# Patient Record
Sex: Female | Born: 1965 | Race: White | Hispanic: No | Marital: Married | Smoking: Never smoker
Health system: Southern US, Community
[De-identification: ages and names within clinical notes are randomized; demographics above are authoritative.]

## PROBLEM LIST (undated history)

## (undated) DIAGNOSIS — E079 Disorder of thyroid, unspecified: Secondary | ICD-10-CM

## (undated) DIAGNOSIS — M1990 Unspecified osteoarthritis, unspecified site: Secondary | ICD-10-CM

## (undated) DIAGNOSIS — J189 Pneumonia, unspecified organism: Secondary | ICD-10-CM

## (undated) DIAGNOSIS — M8580 Other specified disorders of bone density and structure, unspecified site: Secondary | ICD-10-CM

## (undated) DIAGNOSIS — O039 Complete or unspecified spontaneous abortion without complication: Secondary | ICD-10-CM

## (undated) DIAGNOSIS — E042 Nontoxic multinodular goiter: Secondary | ICD-10-CM

## (undated) DIAGNOSIS — K5792 Diverticulitis of intestine, part unspecified, without perforation or abscess without bleeding: Secondary | ICD-10-CM

## (undated) DIAGNOSIS — K56609 Unspecified intestinal obstruction, unspecified as to partial versus complete obstruction: Secondary | ICD-10-CM

## (undated) DIAGNOSIS — T7840XA Allergy, unspecified, initial encounter: Secondary | ICD-10-CM

## (undated) DIAGNOSIS — D497 Neoplasm of unspecified behavior of endocrine glands and other parts of nervous system: Secondary | ICD-10-CM

## (undated) DIAGNOSIS — J45909 Unspecified asthma, uncomplicated: Secondary | ICD-10-CM

## (undated) DIAGNOSIS — F419 Anxiety disorder, unspecified: Secondary | ICD-10-CM

## (undated) HISTORY — PX: KNEE ARTHROSCOPY: SUR90

## (undated) HISTORY — PX: ABDOMINAL HYSTERECTOMY: SHX81

## (undated) HISTORY — DX: Pneumonia, unspecified organism: J18.9

## (undated) HISTORY — DX: Neoplasm of unspecified behavior of endocrine glands and other parts of nervous system: D49.7

## (undated) HISTORY — DX: Disorder of thyroid, unspecified: E07.9

## (undated) HISTORY — DX: Unspecified intestinal obstruction, unspecified as to partial versus complete obstruction: K56.609

## (undated) HISTORY — DX: Complete or unspecified spontaneous abortion without complication: O03.9

## (undated) HISTORY — DX: Other specified disorders of bone density and structure, unspecified site: M85.80

## (undated) HISTORY — PX: FINE NEEDLE ASPIRATION: SHX406

## (undated) HISTORY — DX: Unspecified osteoarthritis, unspecified site: M19.90

## (undated) HISTORY — PX: TONSILLECTOMY: SUR1361

## (undated) HISTORY — DX: Unspecified asthma, uncomplicated: J45.909

## (undated) HISTORY — DX: Nontoxic multinodular goiter: E04.2

## (undated) HISTORY — DX: Anxiety disorder, unspecified: F41.9

## (undated) HISTORY — DX: Diverticulitis of intestine, part unspecified, without perforation or abscess without bleeding: K57.92

## (undated) HISTORY — DX: Allergy, unspecified, initial encounter: T78.40XA

## (undated) NOTE — Anesthesia Preprocedure Evaluation (Signed)
 Formatting of this note might be different from the original. Anesthesia ROS/Med History   + History of anesthetic complications  + PONV  Neuro/Psych  + headaches (Remote hx.)  type: migraine headaches + psychiatric history, type: anxiety  Pulmonary  + asthma (inhaler prn.) Negative for tobacco use  Cardiovascular   09/2014 EKG: SR. :   GI/Hepatic/Renal  diverticulitis:  Endo/Cancer/Other  Thyroid  nodules followed by Dr. Hortensia.  Parathyroid adenoma:   Anesthesia Physical Exam Airway  Mallampati: I Pulmonary  Normal systems: pulmonary exam normal breath sounds clear to auscultation Cardiovascular  Normal systems: cardiovascular exam normal  rhythm: regular  rate: normal   Anesthesia Plan ASA Score: 3   Anesthesia Plan of Care:  GETA Plan Monitor  Monitor: routine Postoperative Plan  Post Operative Plan: routine  Informed consent  Anesthetic Plan and risks discussed with the patient.  Discussed the anesthetic plan with the CRNA and Anesthesiologist.  Information  H & P update: patient reports no changes   Electronically signed by Roselind JINNY Schlatter, CRNA at 12/09/2014  9:23 AM EDT

## (undated) NOTE — Anesthesia Postprocedure Evaluation (Signed)
 Formatting of this note is different from the original.  Anesthesia Post Note  Patient: Makayla Jackson  Procedure(s) Performed: Procedure(s): LEFT MINIMALLY INVASIVE PARATHYROIDECTOMY W/ INTRAOPERATIVE ASSAY   Mental Status: awake and alert   Cardiopulmonary Status: stable and airway patent  Hydration Status: appropriate hydration  PostOp Pain: No pain  Anesthetic Complications: none  Nausea and Vomiting: None  Nausea Protocol: Followed  Per GI Nursing Notes:    Last Vitals:  Filed Vitals:   12/09/14 1255 12/09/14 1300  BP: 122/77 124/82  Pulse: 71 67  Temp:    Resp: 16 20  SpO2: 99% 98%  PainSc: 3     Comments:   Electronically signed by Greig GAILS. Rena, MD at 12/09/2014  1:07 PM EDT

## (undated) NOTE — Unmapped External Note (Signed)
 Formatting of this note might be different from the original. Anesthesia Transfer of Care Note  Patient: Makayla Jackson Procedure(s) performed: Procedure(s): LEFT MINIMALLY INVASIVE PARATHYROIDECTOMY W/ INTRAOPERATIVE ASSAY  Patient location: PACU Patient transported: on O2 Pain score: No pain Post assessment: no apparent anesthetic complications Post vital signs: stable Patient temperature: Patient's temperature is 98.2 Temperature management: Temperature greater than or equal to 36 degrees Celsius (96.83F) Mental status: awake and alert   Comments:  Electronically signed by Roselind JINNY Schlatter, CRNA at 12/09/2014 12:09 PM EDT

## (undated) NOTE — Anesthesia Preprocedure Evaluation (Signed)
 Formatting of this note might be different from the original. Anesthesia ROS/Med History   + History of anesthetic complications  + PONV  Neuro/Psych  + headaches (Remote hx.)  type: migraine headaches + psychiatric history, type: anxiety  Pulmonary  + asthma (inhaler prn.) Negative for tobacco use  Cardiovascular  Patient has a negative cardiovascular ROS   GI/Hepatic/Renal  diverticulitis:  Hematology  Patient has a negative hematology ROS  Endo/Cancer/Other  Patient has a neg endo/other ROS  Thyroid  nodules followed by Dr. Hortensia.:  Dental  Normal systems: no notable dental hx    Anesthesia Physical Exam Airway  Mallampati: II Pulmonary  Normal systems: pulmonary exam normal breath sounds clear to auscultation Cardiovascular  Normal systems: cardiovascular exam normal  rhythm: regular  rate: normal   Anesthesia Plan ASA Score: 2   Anesthesia Plan of Care:  GETA Plan Monitor  Monitor: routine Postoperative Plan  Post Operative Plan: routine  Informed consent  Anesthetic Plan and risks discussed with the patient.  The use of blood products was discussed with and consented by the patient.  Consent given Discussed the anesthetic plan with the CRNA and Anesthesiologist.    Electronically signed by Wolm JAYSON Braun, CRNA at 06/01/2013  6:55 AM EDT

## (undated) NOTE — Anesthesia Postprocedure Evaluation (Signed)
 Formatting of this note is different from the original. Anesthesia Post Note  Patient: Makayla Jackson  Procedure(s) Performed: Procedure(s): LAPAROSCOPIC LEFT COLECTOMY   Mental Status: awake and alert   Cardiopulmonary Status: stable  Hydration Status: appropriate hydration  PostOp Pain: 3/10  Anesthetic Complications: none  Last Vitals:  Filed Vitals:   06/01/13 1055  BP:   Pulse:   Temp:   Resp:   SpO2:   PainSc: 5    Comments: Pain meds administered after VAS 5  Electronically signed by Diamond Andra Mickey LULLA, MD PhD at 06/01/2013 10:59 AM EDT

## (undated) NOTE — Unmapped External Note (Signed)
 Formatting of this note is different from the original. Anesthesia Transfer of Care Note  Patient: Makayla Jackson Procedure(s) performed: Procedure(s): LAPAROSCOPIC LEFT COLECTOMY  Patient location: PACU Patient transported: on O2  Pain score: No pain Post assessment: no apparent anesthetic complications Last vitals:  Filed Vitals:   06/01/13 0951  BP: 138/86  Pulse: 77  Temp: 36.5 C  Resp: 21  SpO2: 100%  PainSc:    Post vital signs: stable Mental status: awake and alert  Complications: none  Comments:  Electronically signed by Wolm JAYSON Braun, CRNA at 06/01/2013  9:53 AM EDT

## (undated) NOTE — Addendum Note (Signed)
 Formatting of this note is different from the original. Addendum created 06/01/13 1258 by Wolm JAYSON Braun, CRNA   Modules edited: Anesthesia Medication Administration   Electronically signed by Wolm JAYSON Braun, CRNA at 06/01/2013 12:58 PM EDT

---

## 1996-08-06 HISTORY — PX: NASAL SINUS SURGERY: SHX719

## 1998-11-11 ENCOUNTER — Other Ambulatory Visit: Admission: RE | Admit: 1998-11-11 | Discharge: 1998-11-11 | Payer: Self-pay | Admitting: *Deleted

## 1999-10-18 ENCOUNTER — Other Ambulatory Visit: Admission: RE | Admit: 1999-10-18 | Discharge: 1999-10-18 | Payer: Self-pay | Admitting: Family Medicine

## 1999-11-02 ENCOUNTER — Encounter: Payer: Self-pay | Admitting: Family Medicine

## 1999-11-02 ENCOUNTER — Encounter: Admission: RE | Admit: 1999-11-02 | Discharge: 1999-11-02 | Payer: Self-pay | Admitting: Family Medicine

## 2000-05-21 ENCOUNTER — Encounter: Payer: Self-pay | Admitting: Family Medicine

## 2000-05-21 ENCOUNTER — Encounter: Admission: RE | Admit: 2000-05-21 | Discharge: 2000-05-21 | Payer: Self-pay | Admitting: Family Medicine

## 2001-11-10 ENCOUNTER — Other Ambulatory Visit: Admission: RE | Admit: 2001-11-10 | Discharge: 2001-11-10 | Payer: Self-pay | Admitting: Family Medicine

## 2002-07-22 ENCOUNTER — Ambulatory Visit (HOSPITAL_COMMUNITY): Admission: RE | Admit: 2002-07-22 | Discharge: 2002-07-22 | Payer: Self-pay | Admitting: Family Medicine

## 2002-07-22 ENCOUNTER — Encounter: Payer: Self-pay | Admitting: Family Medicine

## 2004-09-08 ENCOUNTER — Other Ambulatory Visit: Admission: RE | Admit: 2004-09-08 | Discharge: 2004-09-08 | Payer: Self-pay | Admitting: Family Medicine

## 2005-03-14 ENCOUNTER — Encounter: Admission: RE | Admit: 2005-03-14 | Discharge: 2005-03-14 | Payer: Self-pay | Admitting: Family Medicine

## 2006-01-28 ENCOUNTER — Encounter: Admission: RE | Admit: 2006-01-28 | Discharge: 2006-01-28 | Payer: Self-pay | Admitting: Orthopedic Surgery

## 2006-02-04 ENCOUNTER — Encounter: Admission: RE | Admit: 2006-02-04 | Discharge: 2006-03-21 | Payer: Self-pay | Admitting: Orthopedic Surgery

## 2006-10-08 ENCOUNTER — Ambulatory Visit: Payer: Self-pay | Admitting: Internal Medicine

## 2006-10-08 LAB — CONVERTED CEMR LAB
ALT: 17 units/L (ref 0–40)
AST: 26 units/L (ref 0–37)
Amylase: 95 units/L (ref 27–131)
BUN: 9 mg/dL (ref 6–23)
Basophils Absolute: 0 10*3/uL (ref 0.0–0.1)
Basophils Relative: 0.6 % (ref 0.0–1.0)
Chloride: 105 meq/L (ref 96–112)
Eosinophils Absolute: 0.2 10*3/uL (ref 0.0–0.6)
Eosinophils Relative: 2.4 % (ref 0.0–5.0)
Glucose, Bld: 90 mg/dL (ref 70–99)
Lipase: 35 units/L (ref 11.0–59.0)
Lymphocytes Relative: 24.7 % (ref 12.0–46.0)
MCHC: 34.8 g/dL (ref 30.0–36.0)
Monocytes Absolute: 0.7 10*3/uL (ref 0.2–0.7)
Monocytes Relative: 10.7 % (ref 3.0–11.0)
Neutro Abs: 4 10*3/uL (ref 1.4–7.7)
Platelets: 264 10*3/uL (ref 150–400)
Potassium: 4.1 meq/L (ref 3.5–5.1)
RBC: 4.28 M/uL (ref 3.87–5.11)
RDW: 12.1 % (ref 11.5–14.6)
Sed Rate: 9 mm/hr (ref 0–25)
Total Bilirubin: 0.6 mg/dL (ref 0.3–1.2)
WBC: 6.5 10*3/uL (ref 4.5–10.5)

## 2008-02-10 ENCOUNTER — Ambulatory Visit (HOSPITAL_COMMUNITY): Admission: RE | Admit: 2008-02-10 | Discharge: 2008-02-11 | Payer: Self-pay | Admitting: Obstetrics and Gynecology

## 2008-02-10 ENCOUNTER — Encounter (INDEPENDENT_AMBULATORY_CARE_PROVIDER_SITE_OTHER): Payer: Self-pay | Admitting: Obstetrics and Gynecology

## 2008-02-20 ENCOUNTER — Ambulatory Visit (HOSPITAL_COMMUNITY): Admission: RE | Admit: 2008-02-20 | Discharge: 2008-02-20 | Payer: Self-pay | Admitting: Obstetrics and Gynecology

## 2008-08-06 HISTORY — PX: PARTIAL HYSTERECTOMY: SHX80

## 2010-08-27 ENCOUNTER — Encounter: Payer: Self-pay | Admitting: Family Medicine

## 2010-12-19 NOTE — Discharge Summary (Signed)
NAMEPARISSA, Makayla Jackson                 ACCOUNT NO.:  [id number]   MEDICAL RECORD NO.:  [id number]          PATIENT TYPE:  OIB   LOCATION:  9307                          [facility]ILITY:  [facility]   PHYSICIAN:  Guy Sandifer. Henderson Cloud, M.D. DATE OF BIRTH:  18-Oct-1965   DATE OF ADMISSION:  02/10/2008  DATE OF DISCHARGE:  02/11/2008                               DISCHARGE SUMMARY   ADMITTING DIAGNOSIS:  Menometrorrhagia and dysmenorrhea.   DISCHARGE DIAGNOSIS:  Menometrorrhagia and dysmenorrhea.   PROCEDURE:  On February 10, 2008, laparoscopically-assisted vaginal  hysterectomy with ablation of endometriosis.   REASON FOR ADMISSION:  This patient is a 45 year old married white  female G2, P1, husband status post vasectomy with heavy painful menses.  Details are dictated in history and physical.   HOSPITAL COURSE:  The patient was admitted to the hospital, undergoes  the above procedure.  Estimated blood loss is 150 mL.  On the evening of  surgery, she has good pain relief.  She is tolerating regular diet,  stable vital signs, and was afebrile with dilute urine output.  On the  day of discharge, she is tolerating regular diet, passing flatus,  ambulating, and voiding.  Throughout the evening and today this morning,  she has had some bothersome right upper quadrant pain.  She states it is  not severe.  There is no nausea or vomiting and no food intolerance.  She reports apparently normal HIDA scan about 1 year ago.  On exam, she  has mild tenderness at the right costal margin, but no rebound or  masses.  Abdomen is otherwise soft with good bowel sounds.  On the first  postoperative day, hemoglobin is 11.5, white count is 9.4, and she is  afebrile.  We will obtain a complete metabolic panel today.  If this  appears normal, we will discharge home.  She has already had breakfast  this morning.  She lives about 30 miles away.  We will schedule a  fasting right upper quadrant ultrasound early next week.   CONDITION  ON DISCHARGE:  Good.   DIET:  Low-fat diet.   MEDICATIONS:  1. Percocet 5/325 mg #30, 1-2 p.o. q.6 h. p.r.n.  2. Ibuprofen 600 mg q.6 h. p.r.n.  3. Multivitamin daily.   She will call for problems including but not limited to increasing pain,  persistent nausea, vomiting, heavy bleeding, or temperature 101 degrees.  Followup fasting right upper quadrant ultrasound early next week and  followup in the office in 2 weeks.     Guy Sandifer Henderson Cloud, M.D.  Electronically Signed    JET/MEDQ  D:  02/11/2008  T:  02/11/2008  Job:  [id number]

## 2010-12-19 NOTE — Op Note (Signed)
Makayla Jackson, Makayla Jackson                 ACCOUNT NO.:  [id number]   MEDICAL RECORD NO.:  [id number]          PATIENT TYPE:  OIB   LOCATION:  9307                          [facility]ILITY:  [facility]   PHYSICIAN:  Guy Sandifer. Henderson Cloud, M.D. DATE OF BIRTH:  [birth year]   DATE OF PROCEDURE:  02/10/2008  DATE OF DISCHARGE:                               OPERATIVE REPORT   PREOPERATIVE DIAGNOSES:  Menometrorrhagia and dysmenorrhea.   POSTOPERATIVE DIAGNOSES:  Menometrorrhagia and dysmenorrhea.   PROCEDURE:  Laparoscopically-assisted vaginal hysterectomy with ablation  of endometriosis.   SURGEON:  Khya Halls. Henderson Cloud, MD   ASSISTANT:  Juluis Mire, MD   ANESTHESIA:  General with endotracheal intubation.   SPECIMENS:  Uterus to pathology.   ESTIMATED BLOOD LOSS:  150 mL.   INDICATIONS AND CONSENTS:  This patient is a 45 year old married white  female G2, P1, husband status post vasectomy with progressively heavier  more painful menses.  Details are dictated in history and physical exam.  Laparoscopically-assisted vaginal hysterectomy and possible right  salpingo-oophorectomy is discussed with the patient preoperatively.  Potential risks and complications are discussed preoperatively including  but not limited to infection, organ damage, bleeding requiring  transfusion of blood products with possible HIV and hepatitis  acquisition, DVT, PE, pneumonia, pelvic pain, dyspareunia, laparotomy,  and fistula formation.  All questions have been answered and consent is  signed on the chart.   FINDINGS:  Upper abdomen is grossly normal.  Appendix is normal.  There  are three implants of endometriosis along the vesicouterine fold.  These  are arranged on both sides and in the middle.  There is also an implant  of endometriosis on the right uterosacral ligament.  Tubes and ovaries  are normal bilaterally.   PROCEDURE:  The patient is taken to the operating room where she is  identified, placed in dorsal supine  position and general anesthesia is  induced via endotracheal intubation.  She is then placed in dorsal  lithotomy position where she is prepped abdominally and vaginally.  Bladder straight catheterized.  Hulka tenaculum is placed in the uterus  as a manipulator and she is draped in a sterile fashion.  The  infraumbilical and suprapubic areas are injected in the midline with  0.50% plain Marcaine.  A small infraumbilical incision is made and a  disposable Veress needle is placed on the first attempt without  difficulty.  Normal syringe and drop test are noted.  Two liters of gas  are then insufflated under low pressure with good tympany in the right  upper quadrant.  Veress needle is removed and a 10/11 XL bladeless  disposable trocar sleeve is placed using direct visualization with the  diagnostic laparoscopic.  After placement, the operative laparoscope is  used.  A small suprapubic incision is made in the midline.  The 5-mm XL  bladeless disposable trocar sleeve is placed under direct visualization  without difficulty.  The above findings are noted.  Then using the  EnSeal device with bipolar cautery, the implants of endometriosis are  ablated.  The ureters are seemed to be well cleared  areas of surgery.  Proximal ligaments are then taken down bilaterally on the uterus down to  the level of the vesicouterine peritoneum.  Vesicouterine peritoneum is  then taken down cephalad laterally with scissors.  Good hemostasis is  maintained.  The suprapubic trocar sleeve is removed.  Instruments are  removed and attention is turned to the vagina.  Posterior cul-de-sac is  entered sharply.  Cervix is circumscribed with unipolar cautery, and the  mucosa is advanced sharply and bluntly.  Then using the Gyrus bipolar  cautery instrument, the uterosacral ligaments are taken down followed by  the cardinal ligaments and the uterine vessels bilaterally.  The fundus  is delivered posteriorly.  Remaining  pedicles are taken down and the  specimen is delivered out.  Uterosacral ligaments are then plicated to  the vaginal cuff bilaterally using separate sutures of 0 Monocryl.  All  suture will be 0 Monocryl unless otherwise designated.  Uterosacral  ligaments are then plicated in the midline with a third suture.  Cuff is  closed with figure-of-eights.  Foley catheter is placed in the bladder  and clear urine is noted.  Attention is returned to the abdomen.  Pneumoperitoneum is reintroduced.  Suprapubic trocar sleeve is  reintroduced under direct visualization.  Copious irrigation is carried  out.  Minor bipolar cautery is used to obtain complete hemostasis on  peritoneal edges.  Excess fluid is removed.  Instruments are removed.  Pneumoperitoneum is reduced and the umbilical trocar sleeve is removed  as well.  Skin is closed with 2-0 Vicryl sutures on both incisions.  Dermabond is placed on both incisions.  All counts are correct.  The  patient is awakened, taken to recovery room in stable condition.      Guy Sandifer Henderson Cloud, M.D.  Electronically Signed     JET/MEDQ  D:  02/10/2008  T:  02/10/2008  Job:  [id number]

## 2010-12-19 NOTE — H&P (Signed)
Makayla Jackson, Makayla Jackson                 ACCOUNT NO.:  [id number]   MEDICAL RECORD NO.:  [id number]          PATIENT TYPE:  AMB   LOCATION:                                [facility]   PHYSICIAN:  Lorrayne Ismael. Henderson Cloud, M.D. DATE OF BIRTH:  02/17/1966   DATE OF ADMISSION:  02/10/2008  DATE OF DISCHARGE:                              HISTORY & PHYSICAL   CHIEF COMPLAINT:  Heavy painful menses.   HISTORY OF PRESENT ILLNESS:  This patient is a 45 year old married white  female G2, P1, husband status post vasectomy who is having progressively  heavier menses.  They are occurring about every 3 weeks.  She will have  at least 3 days of heavy flow each month changing a tampon at least  every 2 hours.  This also is accompanied by back cramping starting  before and with the menses.  The pain is greater on the right than on  the left.  Ultrasound on my office reveals a uterus measuring 9.1 x 5.1  x 6.6 cm.  Sonohysterogram was negative for intracavitary masses and the  ovaries appeared normal.  After discussion of options, she is being  admitted for laparoscopically-assisted vaginal hysterectomy.  The right  tube and ovary may be removed if distinctly abnormal.  Potential risks  and complications have been reviewed preoperatively.   PAST MEDICAL HISTORY:  1. Asthma.  2. History of urinary tract infections.   PAST SURGICAL HISTORY:  1. Tonsillectomy.  2. Sinus surgery.  3. Knee surgery.   OBSTETRICAL HISTORY:  Vaginal delivery x1.   MEDICATIONS:  None.   ALLERGIES:  PENICILLIN leading to rash and SULFA leading to rash.  The  patient states that she has taken Keflex without problems.   SOCIAL HISTORY:  Denies tobacco, alcohol, or drug abuse.   FAMILY HISTORY:  Positive for heart disease, respiratory illness,  osteoporosis, and arthritis.   REVIEW OF SYSTEMS:  NEURO:  Denies headache.  CARDIAC:  Denies chest  pain.  PULMONARY:  Denies shortness of breath.  GI: Denies recent  changes in  bowel habits.   PHYSICAL EXAM:  VITAL SIGNS: Height 5 feet 5 inches, weight 129.4  pounds, and blood pressure 102/60.  LUNGS: Clear to auscultation.  HEART: Regular rate and rhythm.  ABDOMEN: Soft and nontender without masses.  PELVIC EXAM: Vulva, vaginal, and cervix without lesion.  Uterus is  anteverted, 6 weeks in size, mobile, and mildly tender.  Right adnexa  mildly tender without palpable masses.  Left adnexa nontender without  masses.  EXTREMITIES: Grossly within normal limits.  NEUROLOGICAL  EXAM: Grossly within normal limits.   ASSESSMENT:  Menometrorrhagia and dysmenorrhea.   PLAN:  Laparoscopically-assisted vaginal hysterectomy and possible right  salpingo-oophorectomy.      Guy Sandifer Henderson Cloud, M.D.  Electronically Signed     JET/MEDQ  D:  02/05/2008  T:  02/06/2008  Job:  [id number]

## 2010-12-22 NOTE — Assessment & Plan Note (Signed)
[facility]                         GASTROENTEROLOGY OFFICE NOTE   Makayla Jackson, Makayla Jackson                        MRN:          [id number]  DATE:10/08/2006                            DOB:          [birth year]    REFERRING PHYSICIAN:  Ysidro Evert., [facility]  Family Medicine.   REASON FOR CONSULTATION:  Gas, bloating, abdominal pain.   ASSESSMENT:  A 45 year old white woman with problems of bloating, some  intermittent diarrhea and epigastric pain that has become progressively  worse after she had knee surgery in the summer.  Some of her symptoms of  diarrhea and nausea may be coming from a recently completed course of  antibiotics for positive H. pylori blood test.   It sounds most like irritable bowel phenomenon to me.  Her hemoglobin is  normal.  There has been no evidence of bleeding.  She has been under  some situational stressors that could be playing a roll.  There are  really no worrisome features to this, i.e., weight loss or bleeding at  this point.   PLAN:  1. Observe things for the time being and let the possible side effects      of her antibiotics clear.  2. Levbid is to be tried one pill twice a day.  3. Align probiotic is to be tried one a day.  4. Laboratory work-up with CBC, differential, CMET, amylase and lipase      and sed rate.  5. Check home Hemoccults.  6. When she is 41 or in her early 88s it would not be unreasonable to      perform a screening colonoscopy because her mother had polyps      before the age of 41 and her father had had colon polyps as well.      We may need to do this for diagnostic purposes but have decided to      hold off on that and upper endoscopy for the time being and follow      up in a month to regroup.  Note:  She has already had an ultrasound      of the abdomen which was unrevealing.   HISTORY:  This is a 44 year old white woman with the problems as  described above.  She  had ligament damage and had right knee surgery in  the summer and since that time, she developed postprandial epigastric  discomfort and some intermittent urgent defecation.  She had not really  had those problems before.  It is not associated with bleeding, weight  loss, fever or chills.  It sort of waxed and waned and then has become  worse.  Her daughter got married in the past year and the patient thinks  that her daughter was too young to do that.  Her husband is about to  change jobs and become Advertising copywriter in [place], [place],  and there is some stress associated with that.  She took H. pylori  therapy with metronidazole, Biaxin and Prevacid and was quite nauseated  and has had some increase in defecation with  loose stools and, in fact,  had eight stools yesterday.  She just finished that.  She has not moved  her bowels today, however.  There has been no bleeding. Gallbladder  ultrasound was unrevealing.  Hemoglobin as described above.   CURRENT MEDICATIONS:  Really none except for Claritin as needed.   ALLERGIES:  PENICILLIN and SULFA.   PAST MEDICAL HISTORY:  1. As described above.  2. Allergies.  3. Right knee surgery.  4. Tonsillectomy age 62.  5. Sinus surgery age 59.   FAMILY HISTORY:  Grandmother with diabetes.  Parents have heart disease.  A cousin had ovarian cancer.  Review otherwise negative except as stated  above.   REVIEW OF SYSTEMS:  See my medical history form for full details.   SOCIAL HISTORY:  She is married.  She is an Tourist information centre manager.  One son, two daughters.  Very sparing alcohol.  No tobacco or drug use.   PHYSICAL EXAMINATION:  GENERAL APPEARANCE:  A pleasant well-developed,  well-nourished white woman.  VITAL SIGNS:  Height 5 feet 4-1/2 inches, weight 136 pounds, blood  pressure 110/72, pulse 84 regular.  HEENT:  Eyes anicteric.  EENT normal mouth, nose and pharynx.  NECK:  Supple, no thyromegaly.  CHEST:  Clear.   CARDIOVASCULAR:  S1 and S2, no murmurs or gallops.  ABDOMEN:  Mildly tender in a diffuse fashion without organomegaly or  mass.  LYMPHATIC:  No neck or supraclavicular nodes.  EXTREMITIES:  No edema.  SKIN:  She has a couple of hives on her neck today, otherwise free of  lesions.  NEUROLOGIC/PSYCHIATRIC:  She is alert and oriented x3.   I appreciate the opportunity to care for this patient.  I have reviewed  the office notes and laboratory testing sent by [facility].     Iva Boop, MD,FACG  Electronically Signed    CEG/MedQ  DD: 10/08/2006  DT: 10/09/2006  Job #: [id number]   cc:   [facility], [place].C.

## 2011-05-03 LAB — COMPREHENSIVE METABOLIC PANEL
Albumin: 2.6 — ABNORMAL LOW
BUN: 3 — ABNORMAL LOW
CO2: 28
Calcium: 9.1
Potassium: 3.4 — ABNORMAL LOW
Sodium: 139

## 2011-05-03 LAB — CBC
Hemoglobin: 14.5
MCHC: 33.9
MCV: 90.6
RBC: 4.75
WBC: 5.8

## 2012-05-25 DIAGNOSIS — E042 Nontoxic multinodular goiter: Secondary | ICD-10-CM | POA: Insufficient documentation

## 2012-08-06 HISTORY — PX: COLON SURGERY: SHX602

## 2012-08-20 DIAGNOSIS — N83209 Unspecified ovarian cyst, unspecified side: Secondary | ICD-10-CM | POA: Insufficient documentation

## 2012-08-20 DIAGNOSIS — K5900 Constipation, unspecified: Secondary | ICD-10-CM | POA: Insufficient documentation

## 2013-12-21 DIAGNOSIS — G43109 Migraine with aura, not intractable, without status migrainosus: Secondary | ICD-10-CM | POA: Insufficient documentation

## 2014-01-12 ENCOUNTER — Encounter: Payer: Self-pay | Admitting: Pediatrics

## 2014-12-05 HISTORY — PX: PARATHYROIDECTOMY: SHX19

## 2015-02-08 DIAGNOSIS — R238 Other skin changes: Secondary | ICD-10-CM | POA: Insufficient documentation

## 2015-07-22 DIAGNOSIS — F419 Anxiety disorder, unspecified: Secondary | ICD-10-CM | POA: Insufficient documentation

## 2016-08-06 DIAGNOSIS — K56609 Unspecified intestinal obstruction, unspecified as to partial versus complete obstruction: Secondary | ICD-10-CM

## 2016-08-06 HISTORY — DX: Unspecified intestinal obstruction, unspecified as to partial versus complete obstruction: K56.609

## 2016-08-07 DIAGNOSIS — K56609 Unspecified intestinal obstruction, unspecified as to partial versus complete obstruction: Secondary | ICD-10-CM | POA: Insufficient documentation

## 2016-08-17 DIAGNOSIS — M79674 Pain in right toe(s): Secondary | ICD-10-CM | POA: Insufficient documentation

## 2016-10-15 ENCOUNTER — Encounter: Payer: Self-pay | Admitting: Pediatrics

## 2016-10-18 DIAGNOSIS — Z8719 Personal history of other diseases of the digestive system: Secondary | ICD-10-CM | POA: Insufficient documentation

## 2017-03-21 ENCOUNTER — Encounter: Payer: Self-pay | Admitting: Pediatrics

## 2017-03-21 ENCOUNTER — Ambulatory Visit (INDEPENDENT_AMBULATORY_CARE_PROVIDER_SITE_OTHER): Payer: BC Managed Care – PPO | Admitting: Pediatrics

## 2017-03-21 VITALS — BP 130/79 | HR 61 | Temp 98.4°F | Ht 64.0 in | Wt 147.8 lb

## 2017-03-21 DIAGNOSIS — E041 Nontoxic single thyroid nodule: Secondary | ICD-10-CM

## 2017-03-21 DIAGNOSIS — E042 Nontoxic multinodular goiter: Secondary | ICD-10-CM | POA: Diagnosis not present

## 2017-03-21 DIAGNOSIS — J4520 Mild intermittent asthma, uncomplicated: Secondary | ICD-10-CM

## 2017-03-21 DIAGNOSIS — E069 Thyroiditis, unspecified: Secondary | ICD-10-CM | POA: Diagnosis not present

## 2017-03-21 DIAGNOSIS — F439 Reaction to severe stress, unspecified: Secondary | ICD-10-CM | POA: Diagnosis not present

## 2017-03-21 DIAGNOSIS — M8580 Other specified disorders of bone density and structure, unspecified site: Secondary | ICD-10-CM

## 2017-03-21 MED ORDER — FLUTICASONE PROPIONATE HFA 110 MCG/ACT IN AERO: INHALATION_SPRAY | RESPIRATORY_TRACT | 3 refills | Status: DC

## 2017-03-21 NOTE — Progress Notes (Signed)
Subjective:   Patient ID: Makayla Jackson, female    DOB: 1965/11/29, 51 y.o.   MRN: [id number] CC: New Patient (Initial Visit) follow up med problems HPI: Makayla Jackson is a 51 y.o. 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 Asthma   . Bowel obstruction ([place]) 08/2016  . Diverticulitis   . Miscarriage   . Multiple thyroid nodules   . Osteopenia   . Parathyroid tumor   . Thyroid disease    Family History  Problem Relation Age of Onset  . Arthritis Mother   . Arthritis/Rheumatoid Mother   . Asthma Mother   . Heart disease Mother   . Hypertension Mother   . Stroke Mother   . Vision loss Mother   . Cancer Mother   . Arthritis Father   . Heart disease Father   . Hypertension Father   . Arthritis/Rheumatoid Maternal Aunt   . Arthritis/Rheumatoid Maternal Uncle   . Diabetes Maternal Grandmother   . Cancer Maternal Grandfather   . Cancer Paternal Grandfather   . Cancer Cousin    Social History   Social History  . Marital status: Married    Spouse name: N/A  . Number of children: N/A  . Years of education: N/A   Social History Main Topics  . Smoking status: Never Smoker  . Smokeless tobacco: Never Used  . Alcohol use 0.6 oz/week    1 Glasses of wine per week     Comment: Occasional  . Drug use: No  . Sexual activity: No   Other  Topics Concern  . None   Social History Narrative  . None   ROS: All systems negative other than what is in HPI  Objective:    BP 130/79   Pulse 61   Temp 98.4 F (36.9 C) (Oral)   Ht 5\' 4"  (1.626 m)   Wt 147 lb 12.8 oz (67 kg)   BMI 25.37 kg/m   Wt Readings from Last 3 Encounters:  03/21/17 147 lb 12.8 oz (67 kg)    Gen: NAD, alert, cooperative with exam, NCAT EYES: EOMI, no conjunctival injection, or no icterus ENT:  TMs pearly gray b/l, OP without erythema LYMPH: no cervical LAD NECK: nl thyroid to palpation CV: NRRR, normal S1/S2, no murmur, distal pulses 2+ b/l Resp: CTABL, no wheezes, normal WOB Abd: +BS, soft, NTND. no guarding or organomegaly Ext: No edema, warm Neuro: Alert and oriented, strength equal b/l UE and LE, coordination grossly normal MSK: normal muscle bulk  Assessment & Plan:  Makayla Jackson was seen today for new patient (initial visit).  Diagnoses and all orders for this visit:  Thyroiditis Recheck labs, previously records available in [place] Not on medication at this time Ref to endocrine -     TSH -     T4, Free -     T3,  Free  Multiple thyroid nodules Followed regularly with ultrasounds per pt -     Ambulatory referral to Endocrinology  Mild intermittent reactive airway disease without complication Stable, cont below Albuterol prn, let me know if using regularly -     fluticasone (FLOVENT HFA) 110 MCG/ACT inhaler; Inhale 2 puffs into the lungs 2 (two) times daily.  Stress Managing well  Osteopenia, unspecified location Cont Ca, Vit D  Follow up plan: Return in about 6 months (around 09/21/2017). Assunta Found, MD [place]

## 2017-03-22 LAB — T3, FREE: T3 FREE: 3.4 pg/mL (ref 2.0–4.4)

## 2017-03-22 LAB — T4, FREE: Free T4: 1.06 ng/dL (ref 0.82–1.77)

## 2017-03-22 LAB — TSH: TSH: 0.285 u[IU]/mL — AB (ref 0.450–4.500)

## 2017-06-06 ENCOUNTER — Encounter: Payer: Self-pay | Admitting: Internal Medicine

## 2017-06-11 ENCOUNTER — Ambulatory Visit (INDEPENDENT_AMBULATORY_CARE_PROVIDER_SITE_OTHER): Payer: BC Managed Care – PPO | Admitting: Family Medicine

## 2017-06-11 VITALS — BP 129/78 | HR 83 | Temp 99.0°F | Ht 64.0 in | Wt 145.2 lb

## 2017-06-11 DIAGNOSIS — N951 Menopausal and female climacteric states: Secondary | ICD-10-CM

## 2017-06-11 DIAGNOSIS — J0140 Acute pansinusitis, unspecified: Secondary | ICD-10-CM | POA: Diagnosis not present

## 2017-06-11 MED ORDER — DOXYCYCLINE HYCLATE 100 MG PO TABS: 100.0000 mg | ORAL_TABLET | Freq: Two times a day (BID) | ORAL | 0 refills | Status: DC

## 2017-06-11 MED ORDER — VENLAFAXINE HCL ER 37.5 MG PO CP24: ORAL_CAPSULE | ORAL | 0 refills | Status: DC

## 2017-06-11 NOTE — Patient Instructions (Signed)
Great to meet you!  Be sure to finish all antibiotics for your sinus infection.  I have started you on Effexor for hot flashes.  Start with 1 capsule daily at breakfast, and 2 weeks increase to 2 daily.  Follow-up with your PCP in about 3-4 weeks.   Sinusitis, Adult Sinusitis is soreness and inflammation of your sinuses. Sinuses are hollow spaces in the bones around your face. They are located:  Around your eyes.  In the middle of your forehead.  Behind your nose.  In your cheekbones.  Your sinuses and nasal passages are lined with a stringy fluid (mucus). Mucus normally drains out of your sinuses. When your nasal tissues get inflamed or swollen, the mucus can get trapped or blocked so air cannot flow through your sinuses. This lets bacteria, viruses, and funguses grow, and that leads to infection. Follow these instructions at home: Medicines  Take, use, or apply over-the-counter and prescription medicines only as told by your doctor. These may include nasal sprays.  If you were prescribed an antibiotic medicine, take it as told by your doctor. Do not stop taking the antibiotic even if you start to feel better. Hydrate and Humidify  Drink enough water to keep your pee (urine) clear or pale yellow.  Use a cool mist humidifier to keep the humidity level in your home above 50%.  Breathe in steam for 10-15 minutes, 3-4 times a day or as told by your doctor. You can do this in the bathroom while a hot shower is running.  Try not to spend time in cool or dry air. Rest  Rest as much as possible.  Sleep with your head raised (elevated).  Make sure to get enough sleep each night. General instructions  Put a warm, moist washcloth on your face 3-4 times a day or as told by your doctor. This will help with discomfort.  Wash your hands often with soap and water. If there is no soap and water, use hand sanitizer.  Do not smoke. Avoid being around people who are smoking (secondhand  smoke).  Keep all follow-up visits as told by your doctor. This is important. Contact a doctor if:  You have a fever.  Your symptoms get worse.  Your symptoms do not get better within 10 days. Get help right away if:  You have a very bad headache.  You cannot stop throwing up (vomiting).  You have pain or swelling around your face or eyes.  You have trouble seeing.  You feel confused.  Your neck is stiff.  You have trouble breathing. This information is not intended to replace advice given to you by your health care provider. Make sure you discuss any questions you have with your health care provider. Document Released: 01/09/2008 Document Revised: 03/18/2016 Document Reviewed: 05/18/2015 Elsevier Interactive Patient Education  [employer].

## 2017-06-11 NOTE — Progress Notes (Signed)
   HPI  Patient presents today here with concern for sinus infection as well as hot flashes.  Sinus infection Patient complains of 2 weeks of bilateral ear pain, facial pressure. She denies fever, chills, sweats.  Hot flashes Patient had partial hysterectomy with ovaries left in place about 10 years ago, over the last month or so she is began having severe hot flashes.  She states that she wakes up sweating at night but sometimes has to change clothese. She is interested in trying medications  PMH: Smoking status noted ROS: Per HPI  Objective: BP 129/78   Pulse 83   Temp 99 F (37.2 C) (Oral)   Ht 5\' 4"  (1.626 m)   Wt 145 lb 3.2 oz (65.9 kg)   BMI 24.92 kg/m  Gen: NAD, alert, cooperative with exam HEENT: NCAT, TMs normal bilaterally, oropharynx moist, nares clear, tenderness to palpation of maxillary and frontal sinuses, right greater than left on maxillary sinuses CV: RRR, good S1/S2, no murmur Resp: CTABL, no wheezes, non-labored Ext: No edema, warm Neuro: Alert and oriented, No gross deficits  Assessment and plan:  #Acute sinusitis 2 weeks of symptoms, penicillin allergy, sent doxycycline Discussed the role of steroids, we will avoid this for now. Supportive care otherwise  #Hot flashes Persistent symptoms, likely postmenopausal or perimenopausal hot flashes Starting venlafaxine after discussion with her. New problem to me, follow-up with PCP in 3-4 weeks      Meds ordered this encounter  Medications  . venlafaxine XR (EFFEXOR XR) 37.5 MG 24 hr capsule    Sig: 1 daily for 2 weeks then 2 daily at breakfast    Dispense:  42 capsule    Refill:  0  . doxycycline (VIBRA-TABS) 100 MG tablet    Sig: Take 1 tablet (100 mg total) 2 (two) times daily by mouth. 1 po bid    Dispense:  20 tablet    Refill:  0    Laroy Apple, MD [facility] 06/11/2017, 9:34 AM

## 2017-07-01 ENCOUNTER — Other Ambulatory Visit: Payer: Self-pay | Admitting: Family Medicine

## 2017-07-08 ENCOUNTER — Ambulatory Visit: Payer: BC Managed Care – PPO | Admitting: Pediatrics

## 2017-07-25 ENCOUNTER — Ambulatory Visit: Payer: BC Managed Care – PPO | Admitting: Pediatrics

## 2017-07-25 ENCOUNTER — Encounter: Payer: Self-pay | Admitting: Pediatrics

## 2017-07-25 VITALS — BP 122/79 | HR 71 | Temp 98.4°F | Ht 64.0 in | Wt 148.4 lb

## 2017-07-25 DIAGNOSIS — K5900 Constipation, unspecified: Secondary | ICD-10-CM

## 2017-07-25 DIAGNOSIS — E042 Nontoxic multinodular goiter: Secondary | ICD-10-CM

## 2017-07-25 DIAGNOSIS — N951 Menopausal and female climacteric states: Secondary | ICD-10-CM | POA: Diagnosis not present

## 2017-07-25 DIAGNOSIS — F419 Anxiety disorder, unspecified: Secondary | ICD-10-CM

## 2017-07-25 NOTE — Progress Notes (Signed)
  Subjective:   Patient ID: Makayla Jackson, female    DOB: 1965-11-23, 51 y.o.   MRN: [id number] CC: Follow-up (4 week, Recheck)  HPI: Makayla Jackson is a 51 y.o. female presenting for Follow-up (4 week, Recheck)  Has generally had some hair loss/hair thinning over past few weeks No bald patches Started on venlafaxine, made her feel "horrible", felt nauseous, not herself, took it for three days then stopped Denies feeling anxious, mood has been fine  Stools either with constipation or diarrhea Takes MiraLAX when she is constipated No abdominal pain Feeling well overall  Has not yet followed up with endocrinology for thyroid nodules  Relevant past medical, surgical, family and social history reviewed. Allergies and medications reviewed and updated. Social History   Tobacco Use  Smoking Status Never Smoker  Smokeless Tobacco Never Used   ROS: Per HPI   Objective:    BP 122/79   Pulse 71   Temp 98.4 F (36.9 C) (Oral)   Ht 5\' 4"  (1.626 m)   Wt 148 lb 6.4 oz (67.3 kg)   BMI 25.47 kg/m   Wt Readings from Last 3 Encounters:  07/25/17 148 lb 6.4 oz (67.3 kg)  06/11/17 145 lb 3.2 oz (65.9 kg)  03/21/17 147 lb 12.8 oz (67 kg)    Gen: NAD, alert, cooperative with exam, NCAT EYES: EOMI, no conjunctival injection, or no icterus ENT:  TMs pearly gray b/l, OP without erythema LYMPH: no cervical LAD CV: NRRR, normal S1/S2, no murmur, distal pulses 2+ b/l Resp: CTABL, no wheezes, normal WOB Abd: +BS, soft, NTND. no guarding or organomegaly Ext: No edema, warm Neuro: Alert and oriented, strength equal b/l UE and LE, coordination grossly normal MSK: normal muscle bulk  Assessment & Plan:  [name] was seen today for follow-up multiple medical problems  Diagnoses and all orders for this visit:  Hot flashes due to menopause Is not interested in alternate medicine for hot flashes at this time Continue symptomatic care  Multiple thyroid nodules Patient to return phone call to set  up appointment with endocrinology for follow-up  Anxiety Stable, does not need any refills per patient  Constipation Increase water intake, increase fiber intake  I spent 25 minutes with the patient with over 50% of the encounter time dedicated to counseling on the above problems.   Follow up plan: Return in about 6 months (around 01/23/2018). Assunta Found, MD [place]

## 2017-08-12 ENCOUNTER — Encounter: Payer: Self-pay | Admitting: Family Medicine

## 2017-08-12 ENCOUNTER — Ambulatory Visit: Payer: BC Managed Care – PPO | Admitting: Family Medicine

## 2017-08-12 VITALS — BP 139/86 | HR 88 | Temp 98.6°F | Ht 64.0 in | Wt 149.0 lb

## 2017-08-12 DIAGNOSIS — R6889 Other general symptoms and signs: Secondary | ICD-10-CM

## 2017-08-12 DIAGNOSIS — R51 Headache: Secondary | ICD-10-CM | POA: Diagnosis not present

## 2017-08-12 LAB — VERITOR FLU A/B WAIVED
Influenza A: NEGATIVE
Influenza B: NEGATIVE

## 2017-08-12 MED ORDER — METHYLPREDNISOLONE ACETATE 80 MG/ML IJ SUSP
80.0000 mg | Freq: Once | INTRAMUSCULAR | Status: AC
  Administered 2017-08-12: 80 mg via INTRAMUSCULAR

## 2017-08-12 MED ORDER — BENZONATATE 200 MG PO CAPS: 200.0000 mg | ORAL_CAPSULE | Freq: Two times a day (BID) | ORAL | 0 refills | Status: DC | PRN

## 2017-08-12 NOTE — Progress Notes (Signed)
Subjective: CC: sinus symptoms PCP: Eustaquio Maize, MD HYQ:[id number] B Stevens is a 52 y.o. female presenting to clinic today for:  1. Sinus symptoms  Patient reports frontal headache, myalgia, facial pressure and dental pain that started last week.  She notes associated subjective fevers and chills.  T-max of 99.6 F.  She reports nasal drainage and congestion.  Cough is productive with green phlegm.  She reports a mild sore throat.  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  She has not had a flu shot this year.  Denies hemoptysis, SOB, dizziness, rash, nausea, vomiting, diarrhea, myalgia, recent travel.  Patient has used over-the-counter decongestion, Allegra-D, nasal spray with little relief of symptoms.  No history of COPD or asthma.  No tobacco use/ exposure.    ROS: Per HPI  Allergies  Allergen Reactions  . Other Anaphylaxis and Hives    Bee stings  . Shellfish Allergy Swelling  . Penicillins Hives  . Sulfa Antibiotics Hives   Past Medical History:  Diagnosis Date  . Asthma   . Bowel obstruction ([place]) 08/2016  . Diverticulitis   . Miscarriage   . Multiple thyroid nodules   . Osteopenia   . Parathyroid tumor   . Thyroid disease     Current Outpatient Medications:  .  acetaminophen (TYLENOL) 325 MG tablet, Take 650 mg by mouth every 6 (six) hours., Disp: , Rfl:  .  ALPRAZolam (XANAX) 0.25 MG tablet, TAKE 1 TABLET(0.25 MG) BY MOUTH EVERY 8 HOURS AS NEEDED FOR SLEEP OR ANXIETY, Disp: , Rfl:  .  Azelastine-Fluticasone 137-50 MCG/ACT SUSP, 1 spray by Nasal route daily. In each nostril, Disp: , Rfl:  .  cetirizine (ZYRTEC) 10 MG tablet, Take 10 mg by mouth daily., Disp: , Rfl:  .  docusate sodium (STOOL SOFTENER) 100 MG capsule, Take 100 mg by mouth daily as needed., Disp: , Rfl:  .  fluticasone (FLOVENT HFA) 110 MCG/ACT inhaler, Inhale 2 puffs into the lungs 2 (two) times daily., Disp: 1 Inhaler, Rfl: 3 .  SUMAtriptan (IMITREX) 50 MG tablet, Take  1 tablet (50 mg total) by mouth daily as needed for Migraine (may repeat in 2 hours)., Disp: , Rfl:  Social History   Socioeconomic History  . Marital status: Married    Spouse name: Not on file  . Number of children: Not on file  . Years of education: Not on file  . Highest education level: Not on file  Social Needs  . Financial resource strain: Not on file  . Food insecurity - worry: Not on file  . Food insecurity - inability: Not on file  . Transportation needs - medical: Not on file  . Transportation needs - non-medical: Not on file  Occupational History  . Not on file  Tobacco Use  . Smoking status: Never Smoker  . Smokeless tobacco: Never Used  Substance and Sexual Activity  . Alcohol use: Yes    Alcohol/week: 0.6 oz    Types: 1 Glasses of wine per week    Comment: Occasional  . Drug use: No  . Sexual activity: No  Other Topics Concern  . Not on file  Social History Narrative  . Not on file   Family History  Problem Relation Age of Onset  . Arthritis Mother   . Arthritis/Rheumatoid Mother   . Asthma Mother   . Heart disease Mother   . Hypertension Mother   . Stroke Mother   . Vision loss Mother   .  Cancer Mother   . Arthritis Father   . Heart disease Father   . Hypertension Father   . Arthritis/Rheumatoid Maternal Aunt   . Arthritis/Rheumatoid Maternal Uncle   . Diabetes Maternal Grandmother   . Cancer Maternal Grandfather   . Cancer Paternal Grandfather   . Cancer Cousin     Objective: Office vital signs reviewed. BP 139/86   Pulse 88   Temp 98.6 F (37 C) (Oral)   Ht 5\' 4"  (1.626 m)   Wt 149 lb (67.6 kg)   BMI 25.58 kg/m   Physical Examination:  General: Awake, alert, well nourished, well appearing female, No acute distress HEENT: +TTP maxillary sinuses    Neck: No masses palpated. No lymphadenopathy    Ears: Tympanic membranes intact, normal light reflex, no erythema, no bulging    Eyes: PERRLA, extraocular membranes intact, sclera white,  no ocular discharge    Nose: nasal turbinates moist, clear nasal discharge    Throat: moist mucus membranes, no erythema, no tonsillar exudate.  Airway is patent Cardio: regular rate and rhythm, S1S2 heard, no murmurs appreciated Pulm: clear to auscultation bilaterally, no wheezes, rhonchi or rales; normal work of breathing on room air  Assessment/ Plan: 53 y.o. female   1. Flu-like symptoms Patient is well-appearing, afebrile with normal vital signs.  Rapid flu was negative.  I suspect that this is a viral URI.  There is nothing on exam to suggest a bacterial infection.  She was given a dose of Depo-Medrol 80 IM here in office.  I recommended that she continue with prescription nasal spray.  May use naproxen or ibuprofen as needed myalgia or fever.  Encourage rest and p.o. fluids.  Work note provided excusing for the next couple of days.  Patient to follow-up if symptoms are persistent or worsen.  Strict return precautions and reasons for emergent evaluation in the emergency department review with patient.  They voiced understanding and will follow-up as needed. - Veritor Flu A/B Waived   Orders Placed This Encounter  Procedures  . Veritor Flu A/B Waived    Order Specific Question:   Source    Answer:   nasal   Meds ordered this encounter  Medications  . methylPREDNISolone acetate (DEPO-MEDROL) injection 80 mg  . benzonatate (TESSALON) 200 MG capsule    Sig: Take 1 capsule (200 mg total) by mouth 2 (two) times daily as needed for cough.    Dispense:  20 capsule    Refill:  [place], DO [place] [phone]

## 2017-08-12 NOTE — Patient Instructions (Addendum)
I value your feedback and appreciate you entrusting [place] with your care.  If you get a survey, I would appreciate your taking the time to let us know what your experience was like.   Your flu test was negative.  It appears that you have a viral upper respiratory infection (cold) contributing to a sinusitis.  I have prescribed you cough medication to use up to 2 times daily.  You may use Naproxen or Ibuprofen as needed as directed for pain/ fever.  You may continue nasal spray as directed.  Cold symptoms can last up to 2 weeks.    - Get plenty of rest and drink plenty of fluids. - Try to breathe moist air. Use a cold mist humidifier. - Consume warm fluids (soup or tea) to provide relief for a stuffy nose and to loosen phlegm. - For nasal stuffiness, try saline nasal spray or a Neti Pot. Afrin nasal spray can also be used but this product should not be used longer than 3 days or it will cause rebound nasal stuffiness (worsening nasal congestion). - For sore throat pain relief: suck on throat lozenges, hard candy or popsicles; gargle with warm salt water (1/4 tsp. salt per 8 oz. of water); and eat soft, bland foods. - Eat a well-balanced diet. If you cannot, ensure you are getting enough nutrients by taking a daily multivitamin. - Avoid dairy products, as they can thicken phlegm. - Avoid alcohol, as it impairs your body's immune system.  CONTACT YOUR DOCTOR IF YOU EXPERIENCE ANY OF THE FOLLOWING: - High fever - Ear pain - Sinus-type headache - Unusually severe cold symptoms - Cough that gets worse while other cold symptoms improve - Flare up of any chronic lung problem, such as asthma - Your symptoms persist longer than 2 weeks   Sinusitis, Adult Sinusitis is soreness and inflammation of your sinuses. Sinuses are hollow spaces in the bones around your face. They are located:  Around your eyes.  In the middle of your forehead.  Behind your nose.  In your cheekbones.  Your sinuses and  nasal passages are lined with a stringy fluid (mucus). Mucus normally drains out of your sinuses. When your nasal tissues get inflamed or swollen, the mucus can get trapped or blocked so air cannot flow through your sinuses. This lets bacteria, viruses, and funguses grow, and that leads to infection. Follow these instructions at home: Medicines  Take, use, or apply over-the-counter and prescription medicines only as told by your doctor. These may include nasal sprays.  If you were prescribed an antibiotic medicine, take it as told by your doctor. Do not stop taking the antibiotic even if you start to feel better. Hydrate and Humidify  Drink enough water to keep your pee (urine) clear or pale yellow.  Use a cool mist humidifier to keep the humidity level in your home above 50%.  Breathe in steam for 10-15 minutes, 3-4 times a day or as told by your doctor. You can do this in the bathroom while a hot shower is running.  Try not to spend time in cool or dry air. Rest  Rest as much as possible.  Sleep with your head raised (elevated).  Make sure to get enough sleep each night. General instructions  Put a warm, moist washcloth on your face 3-4 times a day or as told by your doctor. This will help with discomfort.  Wash your hands often with soap and water. If there is no soap and water, use  hand sanitizer.  Do not smoke. Avoid being around people who are smoking (secondhand smoke).  Keep all follow-up visits as told by your doctor. This is important. Contact a doctor if:  You have a fever.  Your symptoms get worse.  Your symptoms do not get better within 10 days. Get help right away if:  You have a very bad headache.  You cannot stop throwing up (vomiting).  You have pain or swelling around your face or eyes.  You have trouble seeing.  You feel confused.  Your neck is stiff.  You have trouble breathing. This information is not intended to replace advice given to you  by your health care provider. Make sure you discuss any questions you have with your health care provider. Document Released: 01/09/2008 Document Revised: 03/18/2016 Document Reviewed: 05/18/2015 Elsevier Interactive Patient Education  [employer].

## 2017-08-12 NOTE — Progress Notes (Signed)
FLU

## 2017-08-19 ENCOUNTER — Telehealth: Payer: Self-pay | Admitting: Pediatrics

## 2017-08-19 MED ORDER — DOXYCYCLINE HYCLATE 100 MG PO TABS: 100.0000 mg | ORAL_TABLET | Freq: Two times a day (BID) | ORAL | 0 refills | Status: DC

## 2017-08-19 NOTE — Telephone Encounter (Signed)
Covering for PCP  Will cover patient for sinusitis, doxycyline sent ( penicillin allergy)  Laroy Apple, MD [facility] 08/19/2017, 10:07 AM

## 2017-08-19 NOTE — Telephone Encounter (Signed)
What symptoms do you have? Sinus pressure, headache dizzy, ears fill tight-no earache  How long have you been sick? 08/12/17-[place]  Have you been seen for this problem? 08/12/17  If your provider decides to give you a prescription, which pharmacy would you like for it to be sent to? [facility]   Patient informed that this information will be sent to the clinical staff for review and that they should receive a follow up call.

## 2017-08-19 NOTE — Telephone Encounter (Signed)
Patient aware doxycycline has been sent to pharmacy

## 2017-09-19 ENCOUNTER — Ambulatory Visit: Payer: BC Managed Care – PPO | Admitting: Pediatrics

## 2017-09-26 ENCOUNTER — Encounter: Payer: Self-pay | Admitting: Family

## 2017-09-26 ENCOUNTER — Ambulatory Visit: Payer: BC Managed Care – PPO | Admitting: Family

## 2017-09-26 VITALS — BP 131/83 | HR 78 | Temp 97.8°F | Ht 64.0 in | Wt 150.0 lb

## 2017-09-26 DIAGNOSIS — J101 Influenza due to other identified influenza virus with other respiratory manifestations: Secondary | ICD-10-CM | POA: Diagnosis not present

## 2017-09-26 DIAGNOSIS — R6889 Other general symptoms and signs: Secondary | ICD-10-CM | POA: Diagnosis not present

## 2017-09-26 LAB — VERITOR FLU A/B WAIVED
Influenza A: POSITIVE — AB
Influenza B: NEGATIVE

## 2017-09-26 LAB — CULTURE, GROUP A STREP

## 2017-09-26 LAB — RAPID STREP SCREEN (MED CTR MEBANE ONLY): STREP GP A AG, IA W/REFLEX: NEGATIVE

## 2017-09-26 MED ORDER — OSELTAMIVIR PHOSPHATE 75 MG PO CAPS
75.0000 mg | ORAL_CAPSULE | Freq: Two times a day (BID) | ORAL | 0 refills | Status: DC
Start: 2017-09-26 — End: 2017-10-15

## 2017-09-26 NOTE — Progress Notes (Signed)
   Subjective:    Patient ID: Makayla Jackson, female    DOB: 14-Feb-1966, 52 y.o.   MRN: [id number]  Headache   Associated symptoms include coughing, ear pain and a fever.  Diarrhea   Associated symptoms include coughing, a fever and headaches.  Sore Throat   This is a new problem. The current episode started yesterday. The problem has been waxing and waning. Maximum temperature: low grade at home. The pain is at a severity of 6/10. The pain is moderate. Associated symptoms include congestion, coughing, diarrhea, ear pain, headaches, a hoarse voice and trouble swallowing. She has tried acetaminophen and NSAIDs for the symptoms. The treatment provided mild relief.  Fever   Associated symptoms include congestion, coughing, diarrhea, ear pain and headaches.      Review of Systems  Constitutional: Positive for fever.  HENT: Positive for congestion, ear pain, hoarse voice and trouble swallowing.   Respiratory: Positive for cough.   Gastrointestinal: Positive for diarrhea.  Neurological: Positive for headaches.  All other systems reviewed and are negative.      Objective:   Physical Exam  Constitutional: She is oriented to person, place, and time. She appears well-developed and well-nourished. No distress.  HENT:  Head: Normocephalic and atraumatic.  Right Ear: External ear normal. Tympanic membrane is bulging.  Left Ear: External ear normal. Tympanic membrane is bulging.  Nose: Mucosal edema and rhinorrhea present.  Mouth/Throat: Posterior oropharyngeal erythema present.  Eyes: Pupils are equal, round, and reactive to light.  Neck: Normal range of motion. Neck supple. No thyromegaly present.  Cardiovascular: Normal rate, regular rhythm, normal heart sounds and intact distal pulses.  No murmur heard. Pulmonary/Chest: Effort normal and breath sounds normal. No respiratory distress. She has no wheezes.  Intermittent nonproductive cough   Abdominal: Soft. Bowel sounds are normal. She  exhibits no distension. There is no tenderness.  Musculoskeletal: Normal range of motion. She exhibits no edema or tenderness.  Neurological: She is alert and oriented to person, place, and time. She has normal reflexes. No cranial nerve deficit.  Skin: Skin is warm and dry.  Psychiatric: She has a normal mood and affect. Her behavior is normal. Judgment and thought content normal.  Vitals reviewed.   BP 131/83   Pulse 78   Temp 97.8 F (36.6 C) (Oral)   Ht 5\' 4"  (1.626 m)   Wt 150 lb (68 kg)   BMI 25.75 kg/m      Assessment & Plan:  1. Flu-like symptoms - Rapid Strep Screen (Not at [facility]) - Veritor Flu A/B Waived  2. Influenza A Rest Force fluids Tylenol or motrin Good hand hygiene discussed, cover mouth when coughing or sneezing RTO prn or if symptoms worsen or do not improve - oseltamivir (TAMIFLU) 75 MG capsule; Take 1 capsule (75 mg total) by mouth 2 (two) times daily.  Dispense: 10 capsule; Refill: 0    Evelina Dun, FNP

## 2017-09-26 NOTE — Patient Instructions (Signed)

## 2017-10-01 ENCOUNTER — Encounter: Payer: Self-pay | Admitting: Endocrinology

## 2017-10-01 ENCOUNTER — Ambulatory Visit (INDEPENDENT_AMBULATORY_CARE_PROVIDER_SITE_OTHER): Payer: BC Managed Care – PPO | Admitting: Endocrinology

## 2017-10-01 VITALS — BP 136/80 | HR 73 | Ht 64.0 in | Wt 150.0 lb

## 2017-10-01 DIAGNOSIS — E213 Hyperparathyroidism, unspecified: Secondary | ICD-10-CM | POA: Diagnosis not present

## 2017-10-01 DIAGNOSIS — E041 Nontoxic single thyroid nodule: Secondary | ICD-10-CM | POA: Diagnosis not present

## 2017-10-01 LAB — TSH: TSH: 0.46 u[IU]/mL (ref 0.35–4.50)

## 2017-10-01 NOTE — Progress Notes (Signed)
Subjective:    Patient ID: Makayla Jackson, female    DOB: 06-Oct-1965, 52 y.o.   MRN: [id number]  HPI Pt is referred by Dr Evette Doffing, for hyperthyroidism.  Pt reports he was dx'ed with hyperthyroidism in 2013.  she has never been on therapy for this, but she has had multiple bxs and cyst drainage procedures.  she has never had XRT to the anterior neck, or thyroid surgery.  She had parathyroidectomy in 2017.  she does not consume kelp or any other non-prescribed thyroid medication.  she has never been on amiodarone.  She reports chronic slight swelling at the ant neck, but no assoc pain.  Past Medical History:  Diagnosis Date  . Asthma   . Bowel obstruction ([place]) 08/2016  . Diverticulitis   . Miscarriage   . Multiple thyroid nodules   . Osteopenia   . Parathyroid tumor   . Thyroid disease     Past Surgical History:  Procedure Laterality Date  . COLON SURGERY    . FINE NEEDLE ASPIRATION     Thyroid Nodule  . KNEE SURGERY Right   . NASAL SINUS SURGERY    . PARTIAL HYSTERECTOMY    . TONSILLECTOMY      Social History   Socioeconomic History  . Marital status: Married    Spouse name: Not on file  . Number of children: Not on file  . Years of education: Not on file  . Highest education level: Not on file  Social Needs  . Financial resource strain: Not on file  . Food insecurity - worry: Not on file  . Food insecurity - inability: Not on file  . Transportation needs - medical: Not on file  . Transportation needs - non-medical: Not on file  Occupational History  . Not on file  Tobacco Use  . Smoking status: Never Smoker  . Smokeless tobacco: Never Used  Substance and Sexual Activity  . Alcohol use: Yes    Alcohol/week: 0.6 oz    Types: 1 Glasses of wine per week    Comment: Occasional  . Drug use: No  . Sexual activity: No  Other Topics Concern  . Not on file  Social History Narrative  . Not on file    Current Outpatient Medications on File Prior to Visit    Medication Sig Dispense Refill  . acetaminophen (TYLENOL) 325 MG tablet Take 650 mg by mouth every 6 (six) hours.    . ALPRAZolam (XANAX) 0.25 MG tablet TAKE 1 TABLET(0.25 MG) BY MOUTH EVERY 8 HOURS AS NEEDED FOR SLEEP OR ANXIETY    . Azelastine-Fluticasone 137-50 MCG/ACT SUSP 1 spray by Nasal route daily. In each nostril    . cetirizine (ZYRTEC) 10 MG tablet Take 10 mg by mouth daily.    Marland Kitchen docusate sodium (STOOL SOFTENER) 100 MG capsule Take 100 mg by mouth daily as needed.    . fluticasone (FLOVENT HFA) 110 MCG/ACT inhaler Inhale 2 puffs into the lungs 2 (two) times daily. 1 Inhaler 3  . oseltamivir (TAMIFLU) 75 MG capsule Take 1 capsule (75 mg total) by mouth 2 (two) times daily. 10 capsule 0  . SUMAtriptan (IMITREX) 50 MG tablet Take 1 tablet (50 mg total) by mouth daily as needed for Migraine (may repeat in 2 hours).     No current facility-administered medications on file prior to visit.     Allergies  Allergen Reactions  . Other Anaphylaxis and Hives    Bee stings  . Shellfish Allergy Swelling  .  Penicillins Hives  . Sulfa Antibiotics Hives    Family History  Problem Relation Age of Onset  . Arthritis Mother   . Arthritis/Rheumatoid Mother   . Asthma Mother   . Heart disease Mother   . Hypertension Mother   . Stroke Mother   . Vision loss Mother   . Cancer Mother   . Thyroid disease Mother   . Arthritis Father   . Heart disease Father   . Hypertension Father   . Arthritis/Rheumatoid Maternal Aunt   . Arthritis/Rheumatoid Maternal Uncle   . Diabetes Maternal Grandmother   . Cancer Maternal Grandfather   . Cancer Paternal Grandfather   . Cancer Cousin     BP 136/80   Pulse 73   Ht 5\' 4"  (1.626 m)   Wt 150 lb (68 kg)   SpO2 97%   BMI 25.75 kg/m     Review of Systems denies weight loss, headache, hoarseness, visual loss, sob, diarrhea, polyuria, muscle weakness, edema, excessive diaphoresis, tremor, and easy bruising. She reports rhinorrhea, anxiety,  palpitations, and heat intolerance.      Objective:   Physical Exam VS: see vs page GEN: no distress HEAD: head: no deformity eyes: no periorbital swelling, no proptosis external nose and ears are normal mouth: no lesion seen NECK: I believe I can palpate both the left and right nodules. CHEST WALL: no deformity LUNGS: clear to auscultation CV: reg rate and rhythm, no murmur ABD: abdomen is soft, nontender.  no hepatosplenomegaly.  not distended.  no hernia MUSCULOSKELETAL: muscle bulk and strength are grossly normal.  no obvious joint swelling.  gait is normal and steady EXTEMITIES: no deformity.  no edema PULSES: no carotid bruit NEURO:  cn 2-12 grossly intact.   readily moves all 4's.  sensation is intact to touch on all 4's SKIN:  Normal texture and temperature.  No rash or suspicious lesion is visible.   NODES:  None palpable at the neck PSYCH: alert, well-oriented.  Does not appear anxious nor depressed.   Lab Results  Component Value Date   TSH 0.285 (L) 03/21/2017    [place]: 1.6 CM SOLID/CYSTIC NODULE RIGHT THYROID.  1.6 CM CYST AND 0.9 CM CYST LEFT THYROID.  I have reviewed outside records, and summarized: Pt was noted to have suppressed TSH, and referred here.  Several other probs were addressed, including hair loss, anxiety, and constipation.       Assessment & Plan:  Multinodular goiter.  Benign. Hyperthyroidism, new to me.  due to the goiter.     Patient Instructions  blood tests are requested for you today.  We'll let you know about the results.  Please consider the radioactive iodine pill.  It works like this:  We would first check a thyroid "scan" (a special, but easy and painless type of thyroid x ray).  you go to the x-ray department of the hospital to swallow a pill, which contains a miniscule amount of radiation.  You will not notice any symptoms from this.  You will go back to the x-ray department the next day, to lie down in front of a camera.  The  results of this will be sent to me.   Based on the results, i hope to order for you a treatment pill of radioactive iodine.  Although it is a larger amount of radiation, you will again notice no symptoms from this.  The pill is gone from your body in a few days (during which you should stay away from  other people), but takes several months to work.  Therefore, please return here approximately 6-8 weeks after the treatment.  This treatment has been available for many years, and the only known side-effect is an underactive thyroid.  It is possible that i would eventually prescribe for you a thyroid hormone pill, which is very inexpensive.  You don't have to worry about side-effects of this thyroid hormone pill, because it is the same molecule your thyroid makes.      Radioiodine (I-131) Therapy for Hyperthyroidism Radioiodine (I-131) therapy is a procedure to treat an overactive thyroid gland (hyperthyroidism). The thyroid is a gland in the neck that uses iodine to help control how the body uses food (metabolism). In this procedure, you swallow a pill or liquid that contains I-131. I-131 is manufactured (synthetic) iodine that gives off radiation. This destroys thyroid cells and reverses hyperthyroidism. Tell a health care provider about:  Any allergies you have.  All medicines you are taking, including vitamins, herbs, eye drops, creams, and over-the-counter medicines.  Any problems you or family members have had with anesthetic medicines.  Any blood disorders you have.  Any surgeries you have had.  Any medical conditions you have.  Whether you are pregnant, may be pregnant, or have gone through menopause, if this applies.  Whether you currently have children.  Whether you plan to have children in the next 2 years.  Any contact you have with children or pregnant women.  Your travel plans for the next 3 months.  Whether you pass through radiation detectors for work or travel. What are  the risks? Generally, this is a safe procedure. However, problems may occur, including:  Damage to other structures or organs, such as the salivary glands. This could lead to dry mouth and loss of taste.  Low sperm count, if this applies. This may lead to temporary infertility.  Sore throat or neck pain. This is temporary.  Slightlyincreased risk of thyroid cancer.  Nausea or vomiting.  What happens before the procedure?  Ask your health care provider about changing or stopping your regular medicines. This is especially important if you are taking diabetes medicines, blood thinners, or thyroid medicines.  Women may be asked to take a pregnancy test.  Women who are breastfeeding should plan to stop at least 6 weeks before the procedure.  Follow instructions from your health care provider about eating or drinking restrictions.  Plan to avoid contact with others for 1 week after your treatment. It is most important to avoid contact with children and pregnant women. To do this, plan to stay home from work, arrange child care, and sleep alone, if these things apply to you.  Plan to drive yourself home after treatment. Do not take public transportation. If you need someone to drive you home, sit as far away from the driver as possible. What happens during the procedure?  You will be given a dose of I-131 to swallow. It may be a pill or a liquid.  Your thyroid gland will absorb the I-131 over the next 3 months. The treatment process will be complete in about 6 months. What happens after the procedure?  You may need to stay in the hospital for 24 hours after your treatment. This depends on the requirements in your state.  Follow instructions from your health care provider about: ? How to take care of yourself after the procedure. ? How to protect others from exposure to radiation as it leaves your body. This information is not  intended to replace advice given to you by your health care  provider. Make sure you discuss any questions you have with your health care provider. Document Released: 12/09/2008 Document Revised: 12/27/2015 Document Reviewed: 11/17/2014 Elsevier Interactive Patient Education  [employer].

## 2017-10-01 NOTE — Patient Instructions (Addendum)
blood tests are requested for you today.  We'll let you know about the results.  Please consider the radioactive iodine pill.  It works like this:  We would first check a thyroid "scan" (a special, but easy and painless type of thyroid x ray).  you go to the x-ray department of the hospital to swallow a pill, which contains a miniscule amount of radiation.  You will not notice any symptoms from this.  You will go back to the x-ray department the next day, to lie down in front of a camera.  The results of this will be sent to me.   Based on the results, i hope to order for you a treatment pill of radioactive iodine.  Although it is a larger amount of radiation, you will again notice no symptoms from this.  The pill is gone from your body in a few days (during which you should stay away from other people), but takes several months to work.  Therefore, please return here approximately 6-8 weeks after the treatment.  This treatment has been available for many years, and the only known side-effect is an underactive thyroid.  It is possible that i would eventually prescribe for you a thyroid hormone pill, which is very inexpensive.  You don't have to worry about side-effects of this thyroid hormone pill, because it is the same molecule your thyroid makes.      Radioiodine (I-131) Therapy for Hyperthyroidism Radioiodine (I-131) therapy is a procedure to treat an overactive thyroid gland (hyperthyroidism). The thyroid is a gland in the neck that uses iodine to help control how the body uses food (metabolism). In this procedure, you swallow a pill or liquid that contains I-131. I-131 is manufactured (synthetic) iodine that gives off radiation. This destroys thyroid cells and reverses hyperthyroidism. Tell a health care provider about:  Any allergies you have.  All medicines you are taking, including vitamins, herbs, eye drops, creams, and over-the-counter medicines.  Any problems you or family members have  had with anesthetic medicines.  Any blood disorders you have.  Any surgeries you have had.  Any medical conditions you have.  Whether you are pregnant, may be pregnant, or have gone through menopause, if this applies.  Whether you currently have children.  Whether you plan to have children in the next 2 years.  Any contact you have with children or pregnant women.  Your travel plans for the next 3 months.  Whether you pass through radiation detectors for work or travel. What are the risks? Generally, this is a safe procedure. However, problems may occur, including:  Damage to other structures or organs, such as the salivary glands. This could lead to dry mouth and loss of taste.  Low sperm count, if this applies. This may lead to temporary infertility.  Sore throat or neck pain. This is temporary.  Slightlyincreased risk of thyroid cancer.  Nausea or vomiting.  What happens before the procedure?  Ask your health care provider about changing or stopping your regular medicines. This is especially important if you are taking diabetes medicines, blood thinners, or thyroid medicines.  Women may be asked to take a pregnancy test.  Women who are breastfeeding should plan to stop at least 6 weeks before the procedure.  Follow instructions from your health care provider about eating or drinking restrictions.  Plan to avoid contact with others for 1 week after your treatment. It is most important to avoid contact with children and pregnant women. To do this,  plan to stay home from work, arrange child care, and sleep alone, if these things apply to you.  Plan to drive yourself home after treatment. Do not take public transportation. If you need someone to drive you home, sit as far away from the driver as possible. What happens during the procedure?  You will be given a dose of I-131 to swallow. It may be a pill or a liquid.  Your thyroid gland will absorb the I-131 over the  next 3 months. The treatment process will be complete in about 6 months. What happens after the procedure?  You may need to stay in the hospital for 24 hours after your treatment. This depends on the requirements in your state.  Follow instructions from your health care provider about: ? How to take care of yourself after the procedure. ? How to protect others from exposure to radiation as it leaves your body. This information is not intended to replace advice given to you by your health care provider. Make sure you discuss any questions you have with your health care provider. Document Released: 12/09/2008 Document Revised: 12/27/2015 Document Reviewed: 11/17/2014 Elsevier Interactive Patient Education  [employer].

## 2017-10-02 LAB — VITAMIN D 25 HYDROXY (VIT D DEFICIENCY, FRACTURES): VITD: 34.42 ng/mL (ref 30.00–100.00)

## 2017-10-03 LAB — PTH, INTACT AND CALCIUM
CALCIUM: 10.1 mg/dL (ref 8.6–10.4)
PTH: 28 pg/mL (ref 14–64)

## 2017-10-15 ENCOUNTER — Encounter: Payer: Self-pay | Admitting: Pediatrics

## 2017-10-15 ENCOUNTER — Ambulatory Visit: Payer: BC Managed Care – PPO | Admitting: Pediatrics

## 2017-10-15 VITALS — BP 120/87 | HR 89 | Temp 99.5°F | Ht 64.0 in | Wt 152.0 lb

## 2017-10-15 DIAGNOSIS — J019 Acute sinusitis, unspecified: Secondary | ICD-10-CM

## 2017-10-15 MED ORDER — DOXYCYCLINE HYCLATE 100 MG PO TABS: 100.0000 mg | ORAL_TABLET | Freq: Two times a day (BID) | ORAL | 0 refills | Status: DC

## 2017-10-15 NOTE — Progress Notes (Signed)
  Subjective:   Patient ID: Makayla Jackson, female    DOB: January 08, 1966, 52 y.o.   MRN: [id number] CC: Follow-up (6 month) And sinus symptoms HPI: Makayla Jackson is a 52 y.o. female presenting for  She has had sinus pressure and pain for the last 3 weeks.  She was diagnosed with the flu 3 weeks ago.  Continues to have some subjective low-grade temperatures.  Appetite slightly down.  Lots of nasal discharge.  No sore throat.  No coughing.  History of benign multinodular goiter, hyperthyroidism.  Seen by endocrinology.  Most recent TSH was normal.  Options for future treatment if needed include radioactive iodine.    Relevant past medical, surgical, family and social history reviewed. Allergies and medications reviewed and updated. Social History   Tobacco Use  Smoking Status Never Smoker  Smokeless Tobacco Never Used   ROS: Per HPI   Objective:    BP 120/87   Pulse 89   Temp 99.5 F (37.5 C) (Oral)   Ht 5\' 4"  (1.626 m)   Wt 152 lb (68.9 kg)   BMI 26.09 kg/m   Wt Readings from Last 3 Encounters:  10/15/17 152 lb (68.9 kg)  10/01/17 150 lb (68 kg)  09/26/17 150 lb (68 kg)    Gen: NAD, alert, cooperative with exam, NCAT EYES: EOMI, no conjunctival injection, or no icterus ENT:  TMs dull gray b/l, OP without erythema, tender to palpation over maxillary sinuses LYMPH: no cervical LAD CV: NRRR, normal S1/S2, no murmur, distal pulses 2+ b/l Resp: CTABL, no wheezes, normal WOB Abd: +BS, soft, NTND. no guarding or organomegaly Ext: No edema, warm Neuro: Alert and oriented, strength equal b/l UE and LE, coordination grossly normal MSK: normal muscle bulk  Assessment & Plan:  [name] was seen today for sinusitis.  Diagnoses and all orders for this visit:  Acute sinusitis, recurrence not specified, unspecified location Symptom care including saline rinses and return precautions discussed. -     doxycycline (VIBRA-TABS) 100 MG tablet; Take 1 tablet (100 mg total) by mouth 2 (two)  times daily. 1 po bid   Follow up plan: Return in about 4 months (around 02/14/2018). Assunta Found, MD [place]

## 2017-10-18 ENCOUNTER — Encounter: Payer: Self-pay | Admitting: Pediatrics

## 2017-11-04 ENCOUNTER — Ambulatory Visit: Payer: BC Managed Care – PPO | Admitting: Physician Assistant

## 2017-11-04 ENCOUNTER — Encounter: Payer: Self-pay | Admitting: Physician Assistant

## 2017-11-04 VITALS — BP 128/89 | HR 84 | Temp 99.1°F | Ht 64.0 in | Wt 149.2 lb

## 2017-11-04 DIAGNOSIS — Z8719 Personal history of other diseases of the digestive system: Secondary | ICD-10-CM

## 2017-11-04 DIAGNOSIS — K5792 Diverticulitis of intestine, part unspecified, without perforation or abscess without bleeding: Secondary | ICD-10-CM | POA: Diagnosis not present

## 2017-11-04 MED ORDER — CIPROFLOXACIN HCL 500 MG PO TABS: 500.0000 mg | ORAL_TABLET | Freq: Two times a day (BID) | ORAL | 0 refills | Status: DC

## 2017-11-04 MED ORDER — METRONIDAZOLE 500 MG PO TABS
500.0000 mg | ORAL_TABLET | Freq: Three times a day (TID) | ORAL | 0 refills | Status: DC
Start: 2017-11-04 — End: 2018-01-02

## 2017-11-05 ENCOUNTER — Encounter: Payer: Self-pay | Admitting: Internal Medicine

## 2017-11-05 DIAGNOSIS — K5792 Diverticulitis of intestine, part unspecified, without perforation or abscess without bleeding: Secondary | ICD-10-CM | POA: Insufficient documentation

## 2017-11-05 NOTE — Progress Notes (Signed)
BP 128/89   Pulse 84   Temp 99.1 F (37.3 C) (Oral)   Ht 5\' 4"  (1.626 m)   Wt 149 lb 3.2 oz (67.7 kg)   BMI 25.61 kg/m     Subjective:    Patient ID: Makayla Jackson, female    DOB: [birth year], 51 y.o.   MRN: [id number]  HPI: Makayla Jackson is a 52 y.o. female presenting on 11/04/2017 for Diverticulitis and Abdominal Pain  The patient comes in with several days of left lower quadrant pain.  She has had a history of diverticulitis in the past.  She also has had 13 inches of her colon removed.  She had a small bowel obstruction.  That time there was significant pain upon.  This is in her left lower quadrant as she has had before.  She has had a mild fever.  She has never been seen by gastroenterology.  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  Past Medical History:  Diagnosis Date  . Asthma   . Bowel obstruction ([place]) 08/2016  . Diverticulitis   . Miscarriage   . Multiple thyroid nodules   . Osteopenia   . Parathyroid tumor   . Thyroid disease    Relevant past medical, surgical, family and social history reviewed and updated as indicated. Interim medical history since our last visit reviewed. Allergies and medications reviewed and updated. DATA REVIEWED: CHART IN EPIC  Family History reviewed for pertinent findings.  Review of Systems  Constitutional: Negative.   HENT: Negative.   Eyes: Negative.   Respiratory: Negative.   Gastrointestinal: Positive for abdominal distention, abdominal pain and diarrhea. Negative for blood in stool, constipation, rectal pain and vomiting.  Genitourinary: Negative.     Allergies as of 11/04/2017      Reactions   Other Anaphylaxis, Hives   Bee stings   Shellfish Allergy Swelling   Penicillins Hives   Sulfa Antibiotics Hives      Medication List        Accurate as of 11/04/17 11:59 PM. Always use your most recent med list.          acetaminophen 325 MG tablet Commonly known as:   TYLENOL Take 650 mg by mouth every 6 (six) hours.   ALPRAZolam 0.25 MG tablet Commonly known as:  XANAX TAKE 1 TABLET(0.25 MG) BY MOUTH EVERY 8 HOURS AS NEEDED FOR SLEEP OR ANXIETY   Azelastine-Fluticasone 137-50 MCG/ACT Susp 1 spray by Nasal route daily. In each nostril   cetirizine 10 MG tablet Commonly known as:  ZYRTEC Take 10 mg by mouth daily.   ciprofloxacin 500 MG tablet Commonly known as:  CIPRO Take 1 tablet (500 mg total) by mouth 2 (two) times daily.   fluticasone 110 MCG/ACT inhaler Commonly known as:  FLOVENT HFA Inhale 2 puffs into the lungs 2 (two) times daily.   metroNIDAZOLE 500 MG tablet Commonly known as:  FLAGYL Take 1 tablet (500 mg total) by mouth 3 (three) times daily.   STOOL SOFTENER 100 MG capsule Generic drug:  docusate sodium Take 100 mg by mouth daily as needed.   SUMAtriptan 50 MG tablet Commonly known as:  IMITREX Take 1 tablet (50 mg total) by mouth daily as needed for Migraine (may repeat in 2 hours).          Objective:    BP 128/89   Pulse 84   Temp 99.1 F (37.3 C) (  Oral)   Ht 5\' 4"  (1.626 m)   Wt 149 lb 3.2 oz (67.7 kg)   BMI 25.61 kg/m    Allergies  Allergen Reactions  . Other Anaphylaxis and Hives    Bee stings  . Shellfish Allergy Swelling  . Penicillins Hives  . Sulfa Antibiotics Hives    Wt Readings from Last 3 Encounters:  11/04/17 149 lb 3.2 oz (67.7 kg)  10/15/17 152 lb (68.9 kg)  10/01/17 150 lb (68 kg)    Physical Exam  Constitutional: She is oriented to person, place, and time. She appears well-developed and well-nourished.  HENT:  Head: Normocephalic and atraumatic.  Eyes: Pupils are equal, round, and reactive to light. Conjunctivae and EOM are normal.  Cardiovascular: Normal rate, regular rhythm, normal heart sounds and intact distal pulses.  Pulmonary/Chest: Effort normal and breath sounds normal.  Abdominal: Soft. Normal appearance and bowel sounds are normal. There is tenderness in the left  lower quadrant. There is no rigidity, no rebound, no guarding and no CVA tenderness. No hernia.    Neurological: She is alert and oriented to person, place, and time. She has normal reflexes.  Skin: Skin is warm and dry. No rash noted.  Psychiatric: She has a normal mood and affect. Her behavior is normal. Judgment and thought content normal.        Assessment & Plan:   1. History of small bowel obstruction - Ambulatory referral to Gastroenterology  2. Diverticulitis - metroNIDAZOLE (FLAGYL) 500 MG tablet; Take 1 tablet (500 mg total) by mouth 3 (three) times daily.  Dispense: 30 tablet; Refill: 0 - ciprofloxacin (CIPRO) 500 MG tablet; Take 1 tablet (500 mg total) by mouth 2 (two) times daily.  Dispense: 20 tablet; Refill: 0 - Ambulatory referral to Gastroenterology   Continue all other maintenance medications as listed above.  Follow up plan: Return if symptoms worsen or fail to improve.  Educational handout given for [place] PA-C [place] [address]  [place], [place] [address] [phone]   11/05/2017, 12:00 PM

## 2017-12-12 ENCOUNTER — Telehealth: Payer: Self-pay | Admitting: Pediatrics

## 2018-01-02 ENCOUNTER — Encounter: Payer: Self-pay | Admitting: Family Medicine

## 2018-01-02 ENCOUNTER — Ambulatory Visit: Payer: BC Managed Care – PPO | Admitting: Family Medicine

## 2018-01-02 ENCOUNTER — Other Ambulatory Visit: Payer: Self-pay | Admitting: *Deleted

## 2018-01-02 VITALS — BP 119/82 | HR 79 | Temp 98.5°F | Ht 64.0 in | Wt 153.0 lb

## 2018-01-02 DIAGNOSIS — W57XXXA Bitten or stung by nonvenomous insect and other nonvenomous arthropods, initial encounter: Secondary | ICD-10-CM

## 2018-01-02 DIAGNOSIS — R6889 Other general symptoms and signs: Secondary | ICD-10-CM

## 2018-01-02 DIAGNOSIS — S70262A Insect bite (nonvenomous), left hip, initial encounter: Secondary | ICD-10-CM

## 2018-01-02 MED ORDER — DOXYCYCLINE HYCLATE 100 MG PO TABS: 100.0000 mg | ORAL_TABLET | Freq: Two times a day (BID) | ORAL | 0 refills | Status: AC

## 2018-01-02 NOTE — Patient Instructions (Signed)
Lyme Disease Lyme disease is an infection that affects many parts of the body, including the skin, joints, and nervous system. It is a bacterial infection that starts from the bite of an infected tick. The infection can spread, and some of the symptoms are similar to the flu. If Lyme disease is not treated, it may cause joint pain, swelling, numbness, problems thinking, fatigue, muscle weakness, and other problems. What are the causes? This condition is caused by bacteria called Borrelia burgdorferi. You can get Lyme disease by being bitten by an infected tick. The tick must be attached to your skin to pass along the infection. Deer often carry infected ticks. What increases the risk? The following factors may make you more likely to develop this condition:  Living in or visiting these areas in the [place]  [place].  The [place] states.  The [place].  Spending time in wooded or grassy areas.  Being outdoors with exposed skin.  Camping, gardening, hiking, fishing, or hunting outdoors.  Failing to remove a tick from your skin within 3-4 days. What are the signs or symptoms? Symptoms of this condition include:  A round, red rash that surrounds the center of the tick bite. This is the first sign of infection. The center of the rash may be blood colored or have tiny blisters.  Fatigue.  Headache.  Chills and fever.  General achiness.  Joint pain, often in the knees.  Muscle pain.  Swollen lymph glands.  Stiff neck. How is this diagnosed? This condition is diagnosed based on:  Your symptoms and medical history.  A physical exam.  A blood test. How is this treated? The main treatment for this condition is antibiotic medicine, which is usually taken by mouth (orally). The length of treatment depends on how soon after a tick bite you begin taking the medicine. In some cases, treatment is necessary for several weeks. If the infection is severe, antibiotics may  need to be given through an IV tube that is inserted into one of your veins. Follow these instructions at home:  Take your antibiotic medicine as told by your health care provider. Do not stop taking the antibiotic even if you start to feel better.  Ask your health care provider about takinga probiotic in between doses of your antibiotic to help avoid stomach upset or diarrhea.  Check with your health care provider before supplementing your treatment. Many alternative therapies have not been proven and may be harmful to you.  Keep all follow-up visits as told by your health care provider. This is important. How is this prevented? You can become reinfected if you get another tick bite from an infected tick. Take these steps to help prevent an infection:  Cover your skin with light-colored clothing when you are outdoors in the spring and summer months.  Spray clothing and skin with bug spray. The spray should be 20-30% DEET.  Avoid wooded, grassy, and shaded areas.  Remove yard litter, brush, trash, and plants that attract deer and rodents.  Check yourself for ticks when you come indoors.  Wash clothing worn each day.  Check your pets for ticks before they come inside.  If you find a tick:  Remove it with tweezers.  Clean your hands and the bite area with rubbing alcohol or soap and water. Pregnant women should take special care to avoid tick bites because the infection can be passed along to the fetus. Contact a health care provider if:  You have symptoms after   You have symptoms after treatment.  You have removed a tick and want to bring it to your health care provider for testing. Get help right away if:  You have an irregular heartbeat.  You have nerve pain.  Your face feels numb. This information is not intended to replace advice given to you by your health care provider. Make sure you discuss any questions you have with your health care provider. Document Released: 10/29/2000 Document  Revised: 03/13/2016 Document Reviewed: 03/13/2016 Elsevier Interactive Patient Education  2018 Elsevier Inc.  

## 2018-01-02 NOTE — Progress Notes (Signed)
Subjective: CC: Tick bite PCP: Eustaquio Maize, MD HAL:[id number] B Makayla Jackson is a 52 y.o. female presenting to clinic today for:  1. Tick Bite Patient reports that she visualized a tick on her left hip on Tuesday.  She removed it and has since then started developing a very red rash on the left hip.  She reports feeling achy, fatigued and has a headache.  She denies any fevers per se but does note that her temperature has been running about 1 to 2 degrees higher than it normally does.   ROS: Per HPI  Allergies  Allergen Reactions  . Other Anaphylaxis and Hives    Bee stings  . Shellfish Allergy Swelling  . Penicillins Hives  . Sulfa Antibiotics Hives   Past Medical History:  Diagnosis Date  . Asthma   . Bowel obstruction ([place]) 08/2016  . Diverticulitis   . Miscarriage   . Multiple thyroid nodules   . Osteopenia   . Parathyroid tumor   . Thyroid disease     Current Outpatient Medications:  .  acetaminophen (TYLENOL) 325 MG tablet, Take 650 mg by mouth every 6 (six) hours., Disp: , Rfl:  .  ALPRAZolam (XANAX) 0.25 MG tablet, TAKE 1 TABLET(0.25 MG) BY MOUTH EVERY 8 HOURS AS NEEDED FOR SLEEP OR ANXIETY, Disp: , Rfl:  .  Azelastine-Fluticasone 137-50 MCG/ACT SUSP, 1 spray by Nasal route daily. In each nostril, Disp: , Rfl:  .  cetirizine (ZYRTEC) 10 MG tablet, Take 10 mg by mouth daily., Disp: , Rfl:  .  ciprofloxacin (CIPRO) 500 MG tablet, Take 1 tablet (500 mg total) by mouth 2 (two) times daily., Disp: 20 tablet, Rfl: 0 .  docusate sodium (STOOL SOFTENER) 100 MG capsule, Take 100 mg by mouth daily as needed., Disp: , Rfl:  .  fluticasone (FLOVENT HFA) 110 MCG/ACT inhaler, Inhale 2 puffs into the lungs 2 (two) times daily., Disp: 1 Inhaler, Rfl: 3 .  metroNIDAZOLE (FLAGYL) 500 MG tablet, Take 1 tablet (500 mg total) by mouth 3 (three) times daily., Disp: 30 tablet, Rfl: 0 .  SUMAtriptan (IMITREX) 50 MG tablet, Take 1 tablet (50 mg total) by mouth daily as needed for Migraine (may  repeat in 2 hours)., Disp: , Rfl:  Social History   Socioeconomic History  . Marital status: Married    Spouse name: Not on file  . Number of children: Not on file  . Years of education: Not on file  . Highest education level: Not on file  Occupational History  . Not on file  Social Needs  . Financial resource strain: Not on file  . Food insecurity:    Worry: Not on file    Inability: Not on file  . Transportation needs:    Medical: Not on file    Non-medical: Not on file  Tobacco Use  . Smoking status: Never Smoker  . Smokeless tobacco: Never Used  Substance and Sexual Activity  . Alcohol use: Yes    Alcohol/week: 0.6 oz    Types: 1 Glasses of wine per week    Comment: Occasional  . Drug use: No  . Sexual activity: Never  Lifestyle  . Physical activity:    Days per week: Not on file    Minutes per session: Not on file  . Stress: Not on file  Relationships  . Social connections:    Talks on phone: Not on file    Gets together: Not on file    Attends religious service: Not  on file    Active member of club or organization: Not on file    Attends meetings of clubs or organizations: Not on file    Relationship status: Not on file  . Intimate partner violence:    Fear of current or ex partner: Not on file    Emotionally abused: Not on file    Physically abused: Not on file    Forced sexual activity: Not on file  Other Topics Concern  . Not on file  Social History Narrative  . Not on file   Family History  Problem Relation Age of Onset  . Arthritis Mother   . Arthritis/Rheumatoid Mother   . Asthma Mother   . Heart disease Mother   . Hypertension Mother   . Stroke Mother   . Vision loss Mother   . Cancer Mother   . Thyroid disease Mother   . Arthritis Father   . Heart disease Father   . Hypertension Father   . Arthritis/Rheumatoid Maternal Aunt   . Arthritis/Rheumatoid Maternal Uncle   . Diabetes Maternal Grandmother   . Cancer Maternal Grandfather   .  Cancer Paternal Grandfather   . Cancer Cousin     Objective: Office vital signs reviewed. BP 119/82   Pulse 79   Temp 98.5 F (36.9 C) (Oral)   Ht 5\' 4"  (1.626 m)   Wt 153 lb (69.4 kg)   BMI 26.26 kg/m   Physical Examination:  General: Awake, alert, well nourished, No acute distress HEENT: sclera white, MMM Cardio: regular rate  Pulm:  normal work of breathing on room air Skin: Blanching, erythematous rash appreciated along the left hip that is approximately 3-1/2 inches x 3-1/2 inches in size.  There is a slight clearing at the base of the rash but it is not a typical erythema migrans type rash.  No palpable induration, fluctuance.  No purulence from central punctum.  Assessment/ Plan: 51 y.o. female   1. Tick bite, initial encounter Patient is afebrile nontoxic-appearing here in office.  Given the constellation of flulike symptoms with the rash along the left hip, we did discuss consideration for treatment for potential Lyme disease with doxycycline.  Patient does wish to proceed with treatment.  Doxycycline 100 mg p.o. twice daily prescribed.  We also reviewed thought Lyme titers could be obtained at some point if she desired a definitive diagnoses but that this would unlikely test positive even if she were to have a Lyme infection at this point given acute onset/exposure to tick bite.  Patient will follow-up with PCP if symptoms are not improving or worsening.  Reasons for emergent evaluation discussed.  Follow-up as needed.  2. Flu-like symptoms   Meds ordered this encounter  Medications  . doxycycline (VIBRA-TABS) 100 MG tablet    Sig: Take 1 tablet (100 mg total) by mouth 2 (two) times daily for 14 days.    Dispense:  28 tablet    Refill:  [place], DO [place] [phone]

## 2018-01-03 MED ORDER — ALPRAZOLAM 0.25 MG PO TABS: ORAL_TABLET | ORAL | 0 refills | Status: DC

## 2018-01-03 NOTE — Telephone Encounter (Signed)
Patient aware.

## 2018-01-03 NOTE — Telephone Encounter (Signed)
Refill of what?  

## 2018-01-03 NOTE — Telephone Encounter (Signed)
Patient has a 6 month follow up with you on 01/31/18.  In the meantime, she would like to know if you can send in a refill of her Alprazolam.  She uses 1/2 tablet prn for sleep.  Last fill 02/27/2017.

## 2018-01-16 ENCOUNTER — Ambulatory Visit: Payer: BC Managed Care – PPO | Admitting: Internal Medicine

## 2018-01-31 ENCOUNTER — Encounter: Payer: Self-pay | Admitting: Pediatrics

## 2018-01-31 ENCOUNTER — Ambulatory Visit: Payer: BC Managed Care – PPO | Admitting: Pediatrics

## 2018-01-31 VITALS — BP 114/73 | HR 78 | Temp 98.2°F | Ht 64.0 in | Wt 151.6 lb

## 2018-01-31 DIAGNOSIS — R232 Flushing: Secondary | ICD-10-CM

## 2018-01-31 DIAGNOSIS — G43809 Other migraine, not intractable, without status migrainosus: Secondary | ICD-10-CM

## 2018-01-31 MED ORDER — SUMATRIPTAN SUCCINATE 50 MG PO TABS: ORAL_TABLET | ORAL | 3 refills | Status: DC

## 2018-01-31 MED ORDER — VENLAFAXINE HCL 25 MG PO TABS: 25.0000 mg | ORAL_TABLET | Freq: Two times a day (BID) | ORAL | 3 refills | Status: DC

## 2018-01-31 NOTE — Progress Notes (Signed)
  Subjective:   Patient ID: Makayla Jackson, female    DOB: January 26, 1966, 52 y.o.   MRN: [id number] CC: Medical Management of Chronic Issues  HPI: Makayla Jackson is a 52 y.o. female   Migraine: Increased over the last few weeks.  Finished her last Imitrex dose.  Headache usually starts down the back of her neck and comes to the front of her forehead.  Usually on the left side.  This is similar to her other headaches though her other headaches usually started before headache moved back to her neck.  Sometimes she will see halos around lights during the headaches.  Sometimes feels slightly nauseous.  Usually Excedrin by drinking improves her headaches.  Rarely she has to take Imitrex for it, most recently last week.  She had 3 headaches last week is more than usual for her.  Hot flashes: Come out of the blue.  Been bothering her more often over the last few weeks.  Interested in medication for it.  Mammogram: Due  Colonoscopy: Done in 2014.  She diverticulosis.  Visible through care everywhere.  10 year recall.  Relevant past medical, surgical, family and social history reviewed. Allergies and medications reviewed and updated. Social History   Tobacco Use  Smoking Status Never Smoker  Smokeless Tobacco Never Used   ROS: Per HPI   Objective:    BP 114/73   Pulse 78   Temp 98.2 F (36.8 C) (Oral)   Ht 5\' 4"  (1.626 m)   Wt 151 lb 9.6 oz (68.8 kg)   BMI 26.02 kg/m   Wt Readings from Last 3 Encounters:  01/31/18 151 lb 9.6 oz (68.8 kg)  01/02/18 153 lb (69.4 kg)  11/04/17 149 lb 3.2 oz (67.7 kg)    Gen: NAD, alert, cooperative with exam, NCAT EYES: EOMI, no conjunctival injection, or no icterus ENT:  TMs pearly gray b/l, OP without erythema LYMPH: no cervical LAD Neck: Normal thyroid CV: NRRR, normal S1/S2, no murmur, distal pulses 2+ b/l Resp: CTABL, no wheezes, normal WOB Abd: +BS, soft, NTND. no guarding or organomegaly Ext: No edema, warm Neuro: Alert and oriented, strength  equal b/l UE and LE, coordination grossly normal MSK: normal muscle bulk  Assessment & Plan:  Makayla Jackson was seen today for medical management of chronic issues.  Diagnoses and all orders for this visit:  Other migraine without status migrainosus, not intractable Trial of venlafaxine to target migraines and hot flashes.  Start at low dose.  Sumatriptan as needed, no more than twice a week.  Refill sent in.  Headaches not improving and certainly for any worsening let me know. -     SUMAtriptan (IMITREX) 50 MG tablet; Take 1 tablet (50 mg total) by mouth daily as needed for Migraine (may repeat in 2 hours). -     venlafaxine (EFFEXOR) 25 MG tablet; Take 1 tablet (25 mg total) by mouth 2 (two) times daily.  Hot flashes Trial of venlafaxine.  Follow up plan: Return in about 3 months (around 05/03/2018). Assunta Found, MD [place]

## 2018-01-31 NOTE — Patient Instructions (Signed)
[facility] Appointment: [phone]  

## 2018-02-07 ENCOUNTER — Telehealth: Payer: Self-pay | Admitting: Internal Medicine

## 2018-02-07 ENCOUNTER — Encounter: Payer: Self-pay | Admitting: Physician Assistant

## 2018-02-07 NOTE — Telephone Encounter (Signed)
See Dr Celesta Aver response.  OK to schedule if patient needs an appt.

## 2018-02-07 NOTE — Telephone Encounter (Signed)
See message.

## 2018-02-07 NOTE — Telephone Encounter (Signed)
[facility] records are in care everywhere. Dr.Gessner will you accept to see patient back in your practice?

## 2018-02-07 NOTE — Telephone Encounter (Signed)
Am happy to see her again  If she needs help before she returns to school [employer]) will need to see an APP

## 2018-02-10 ENCOUNTER — Encounter: Payer: Self-pay | Admitting: Internal Medicine

## 2018-02-10 NOTE — Telephone Encounter (Signed)
Patient has been notified of this and has been scheduled for an office visit with Dr.Gessner for 04/29/18 at 2:45pm.

## 2018-02-22 ENCOUNTER — Other Ambulatory Visit: Payer: Self-pay | Admitting: Pediatrics

## 2018-02-22 DIAGNOSIS — G43809 Other migraine, not intractable, without status migrainosus: Secondary | ICD-10-CM

## 2018-02-25 ENCOUNTER — Ambulatory Visit: Payer: Self-pay | Admitting: Physician Assistant

## 2018-03-06 ENCOUNTER — Ambulatory Visit: Payer: BC Managed Care – PPO | Admitting: Pediatrics

## 2018-03-06 ENCOUNTER — Encounter: Payer: Self-pay | Admitting: Pediatrics

## 2018-03-06 VITALS — BP 114/81 | HR 68 | Temp 97.9°F | Resp 18 | Ht 64.0 in | Wt 154.4 lb

## 2018-03-06 DIAGNOSIS — Z0289 Encounter for other administrative examinations: Secondary | ICD-10-CM

## 2018-03-06 DIAGNOSIS — Z021 Encounter for pre-employment examination: Secondary | ICD-10-CM

## 2018-03-06 DIAGNOSIS — E069 Thyroiditis, unspecified: Secondary | ICD-10-CM

## 2018-03-06 NOTE — Progress Notes (Signed)
  Subjective:   Patient ID: Makayla Jackson, female    DOB: 05-Jun-1966, 52 y.o.   MRN: [id number] CC: work form  HPI: Makayla Jackson is a 52 y.o. 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  Starting a new job.  Needs form filled out.  No physical limitations to activities.  Headaches improved on the venlafaxine, taking half tab twice a day.  Only one headache since last visit.  Mood has been fine.  No palpitations, chest tightness.  No temperature intolerances.  Relevant past medical, surgical, family and social history reviewed. Allergies and medications reviewed and updated. Social History   Tobacco Use  Smoking Status Never Smoker  Smokeless Tobacco Never Used   ROS: Negative other than what is in the HPI.  Objective:    BP 114/81   Pulse 68   Temp 97.9 F (36.6 C) (Oral)   Resp 18   Ht 5\' 4"  (1.626 m)   Wt 154 lb 6.4 oz (70 kg)   BMI 26.50 kg/m   Wt Readings from Last 3 Encounters:  03/06/18 154 lb 6.4 oz (70 kg)  01/31/18 151 lb 9.6 oz (68.8 kg)  01/02/18 153 lb (69.4 kg)    Gen: NAD, alert, cooperative with exam, NCAT EYES: EOMI, no conjunctival injection, or no icterus ENT:  TMs pearly gray b/l, OP without erythema LYMPH: no cervical LAD CV: NRRR, normal S1/S2, no murmur, distal pulses 2+ b/l Resp: CTABL, no wheezes, normal WOB Abd: +BS, soft, NTND. no guarding or organomegaly Ext: No edema, warm Neuro: Alert and oriented, strength equal b/l UE and LE, coordination grossly normal MSK: normal muscle bulk  Assessment & Plan:  [name] was seen today for work form.  Diagnoses and all orders for this visit:  Encounter for physical examination related to employment No limitations to activities.  Headaches improving.  Hyperthyroiditis -     TSH; Future   Follow up plan: 6 months, sooner if needed. Assunta Found, MD [place]

## 2018-03-07 ENCOUNTER — Ambulatory Visit (INDEPENDENT_AMBULATORY_CARE_PROVIDER_SITE_OTHER): Payer: Self-pay | Admitting: *Deleted

## 2018-03-07 ENCOUNTER — Telehealth: Payer: Self-pay | Admitting: Pediatrics

## 2018-03-07 DIAGNOSIS — Z111 Encounter for screening for respiratory tuberculosis: Secondary | ICD-10-CM

## 2018-03-07 NOTE — Telephone Encounter (Signed)
Patient aware that TB test will need to be read before finish filling form out

## 2018-03-07 NOTE — Progress Notes (Signed)
Pt here today for PPD. PPD placed left forearm without difficulty and pt tolerated well.

## 2018-03-10 LAB — TB SKIN TEST
Induration: 0 mm
TB Skin Test: NEGATIVE

## 2018-04-28 ENCOUNTER — Encounter: Payer: Self-pay | Admitting: Internal Medicine

## 2018-04-29 ENCOUNTER — Ambulatory Visit: Payer: BC Managed Care – PPO | Admitting: Internal Medicine

## 2018-05-07 ENCOUNTER — Other Ambulatory Visit: Payer: BC Managed Care – PPO

## 2018-05-07 ENCOUNTER — Ambulatory Visit (INDEPENDENT_AMBULATORY_CARE_PROVIDER_SITE_OTHER): Payer: BC Managed Care – PPO | Admitting: Pediatrics

## 2018-05-07 ENCOUNTER — Encounter: Payer: Self-pay | Admitting: Pediatrics

## 2018-05-07 VITALS — BP 133/86 | HR 78 | Temp 99.2°F | Ht 64.0 in

## 2018-05-07 DIAGNOSIS — M2550 Pain in unspecified joint: Secondary | ICD-10-CM

## 2018-05-07 DIAGNOSIS — J4520 Mild intermittent asthma, uncomplicated: Secondary | ICD-10-CM

## 2018-05-07 DIAGNOSIS — M25561 Pain in right knee: Secondary | ICD-10-CM

## 2018-05-07 DIAGNOSIS — E0590 Thyrotoxicosis, unspecified without thyrotoxic crisis or storm: Secondary | ICD-10-CM

## 2018-05-07 DIAGNOSIS — G4700 Insomnia, unspecified: Secondary | ICD-10-CM

## 2018-05-07 DIAGNOSIS — G43809 Other migraine, not intractable, without status migrainosus: Secondary | ICD-10-CM | POA: Diagnosis not present

## 2018-05-07 DIAGNOSIS — Z23 Encounter for immunization: Secondary | ICD-10-CM

## 2018-05-07 MED ORDER — VENLAFAXINE HCL 25 MG PO TABS: 25.0000 mg | ORAL_TABLET | Freq: Two times a day (BID) | ORAL | 1 refills | Status: DC

## 2018-05-07 MED ORDER — SUMATRIPTAN SUCCINATE 50 MG PO TABS: ORAL_TABLET | ORAL | 3 refills | Status: DC

## 2018-05-07 MED ORDER — ALPRAZOLAM 0.25 MG PO TABS: ORAL_TABLET | ORAL | 2 refills | Status: DC

## 2018-05-07 MED ORDER — FLUTICASONE PROPIONATE HFA 110 MCG/ACT IN AERO: INHALATION_SPRAY | RESPIRATORY_TRACT | 3 refills | Status: DC

## 2018-05-07 NOTE — Progress Notes (Signed)
Subjective:   Patient ID: Makayla Jackson, female    DOB: [birth year], 52 y.o.   MRN: [id number] CC: Medical Management of Chronic Issues  HPI: Makayla Jackson is a 52 y.o. female   Joint pains mostly from waist down bothering her. Heels in the morning bothering her, has pain when first out of bed. Knees, hips, lower back, neck, shoulders also bothering her. Knees and ankles sometimes swelling. No redness. Pain worst in the mornings. Also notices in evening when she stands up after sitting for a while.  Finger joints sometimes swelling, sometimes PIP, sometimes DIP on different fingers.  Sometimes the MCP swells as well.  She notices knee pain going up and down stairs.  She is very active, was regularly in the pool, walking over the summer.  The joint pain has kept her from some of her usual activities.  Wears tennis shoes for the most part to work.  Treated with 14 days of doxycycline in May following a tick bite for possible Lyme disease.  No testing done at that time.  She did have a red rash, slightly higher than normal temperatures, increased fatigue, headache, aches at the time of treatment.  Mother with history of RA.  Hyperthyroidism: She has had slightly decreased TSH off and on over the last few years.  Most recently was within normal limits.  Migraines: 2 migraines in the last month.  Was able to relieve symptoms with Excedrin Migraine.  Taking venlafaxine twice a day.  She does think it has been helping prevent headaches.  Insomnia: Taking alprazolam 2-3 times a week as needed to help with sleep.  Not taking nightly.  Relevant past medical, surgical, family and social history reviewed. Allergies and medications reviewed and updated. Social History   Tobacco Use  Smoking Status Never Smoker  Smokeless Tobacco Never Used   ROS: Per HPI   Objective:    BP 133/86   Pulse 78   Temp 99.2 F (37.3 C) (Oral)   Ht 5\' 4"  (1.626 m)   BMI 26.50 kg/m   Wt Readings from Last 3  Encounters:  03/06/18 154 lb 6.4 oz (70 kg)  01/31/18 151 lb 9.6 oz (68.8 kg)  01/02/18 153 lb (69.4 kg)    Gen: NAD, alert, cooperative with exam, NCAT EYES: EOMI, no conjunctival injection, or no icterus ENT: OP without erythema LYMPH: no cervical LAD CV: NRRR, normal S1/S2, no murmur, distal pulses 2+ b/l Resp: CTABL, no wheezes, normal WOB Ext: No edema, warm Neuro: Alert and oriented, strength equal b/l UE and LE, coordination grossly normal MSK: normal muscle bulk, crepitus present bilateral knees, normal range of motion.  Mild joint line tenderness bilaterally.  No effusions. No synovitis MCP joints bilaterally.  Mild swelling present with no redness PIP, DIP joints of fingers. Mild tenderness with heel squeeze bilaterally.  Mildly tender with palpation insertion site of plantar fascia left foot greater than right foot.  Assessment & Plan:  Liadan was seen today for medical management of chronic issues.  Diagnoses and all orders for this visit:  Arthralgia, unspecified joint Plantar fasciitis We will check blood work, knee x-ray.  Discussed improving arch support and stretches for plantar fascia.  -     CBC with Differential/Platelet -     C-reactive protein -     Sedimentation rate -     Rheumatoid factor -     ANA -     CYCLIC CITRUL PEPTIDE ANTIBODY, IGG/IGA - CMP  Other migraine  without status migrainosus, not intractable Improved with below, continue. rarely needing Imitrex. -     venlafaxine (EFFEXOR) 25 MG tablet; Take 1 tablet (25 mg total) by mouth 2 (two) times daily. -     SUMAtriptan (IMITREX) 50 MG tablet; Take 1 tablet (50 mg total) by mouth daily as needed for Migraine (may repeat in 2 hours).  Mild intermittent reactive airway disease without complication More symptoms the winter, usually starts Flovent then. -     fluticasone (FLOVENT HFA) 110 MCG/ACT inhaler; Inhale 2 puffs into the lungs 2 (two) times daily.  Hyperthyroidism -     TSH  Insomnia,  unspecified type Stable, taking below 2-3 times a week.  Continue to minimize use as able. -     ALPRAZolam (XANAX) 0.25 MG tablet; TAKE 0.5 to 1 TABLET(0.25 MG) BY MOUTH qhs AS NEEDED FOR SLEEP OR ANXIETY  Recurrent pain of right knee Has had surgery on the knee before.  Stairs seem to bother her more recently. -     DG Knee 1-2 Views Right; Future   Follow up plan: Return in about 3 months (around 08/07/2018). Assunta Found, MD [place]

## 2018-05-08 LAB — CBC WITH DIFFERENTIAL/PLATELET
BASOS ABS: 0.1 10*3/uL (ref 0.0–0.2)
Basos: 1 %
EOS (ABSOLUTE): 0.1 10*3/uL (ref 0.0–0.4)
Eos: 3 %
Hematocrit: 42.9 % (ref 34.0–46.6)
Hemoglobin: 14 g/dL (ref 11.1–15.9)
IMMATURE GRANS (ABS): 0 10*3/uL (ref 0.0–0.1)
Immature Granulocytes: 0 %
LYMPHS ABS: 2.1 10*3/uL (ref 0.7–3.1)
LYMPHS: 40 %
MCH: 29.1 pg (ref 26.6–33.0)
MCHC: 32.6 g/dL (ref 31.5–35.7)
MCV: 89 fL (ref 79–97)
MONOCYTES: 11 %
Monocytes Absolute: 0.6 10*3/uL (ref 0.1–0.9)
NEUTROS PCT: 45 %
Neutrophils Absolute: 2.4 10*3/uL (ref 1.4–7.0)
Platelets: 327 10*3/uL (ref 150–450)
RBC: 4.81 x10E6/uL (ref 3.77–5.28)
RDW: 14.1 % (ref 12.3–15.4)
WBC: 5.2 10*3/uL (ref 3.4–10.8)

## 2018-05-08 LAB — CMP14+EGFR
A/G RATIO: 1.9 (ref 1.2–2.2)
ALT: 15 IU/L (ref 0–32)
AST: 21 IU/L (ref 0–40)
Albumin: 4.4 g/dL (ref 3.5–5.5)
Alkaline Phosphatase: 61 IU/L (ref 39–117)
BUN/Creatinine Ratio: 13 (ref 9–23)
BUN: 10 mg/dL (ref 6–24)
Bilirubin Total: 0.3 mg/dL (ref 0.0–1.2)
CHLORIDE: 99 mmol/L (ref 96–106)
CO2: 26 mmol/L (ref 20–29)
CREATININE: 0.77 mg/dL (ref 0.57–1.00)
Calcium: 9.8 mg/dL (ref 8.7–10.2)
GFR calc Af Amer: 104 mL/min/{1.73_m2} (ref >59)
GFR calc non Af Amer: 90 mL/min/{1.73_m2} (ref >59)
GLOBULIN, TOTAL: 2.3 g/dL (ref 1.5–4.5)
Glucose: 86 mg/dL (ref 65–99)
POTASSIUM: 4.7 mmol/L (ref 3.5–5.2)
SODIUM: 138 mmol/L (ref 134–144)
Total Protein: 6.7 g/dL (ref 6.0–8.5)

## 2018-05-08 LAB — RHEUMATOID FACTOR: Rhuematoid fact SerPl-aCnc: 10.9 IU/mL (ref 0.0–13.9)

## 2018-05-08 LAB — ANA: Anti Nuclear Antibody(ANA): NEGATIVE

## 2018-05-08 LAB — C-REACTIVE PROTEIN: CRP: 1 mg/L (ref 0–10)

## 2018-05-08 LAB — SEDIMENTATION RATE: Sed Rate: 2 mm/hr (ref 0–40)

## 2018-05-08 LAB — TSH: TSH: 0.444 u[IU]/mL — AB (ref 0.450–4.500)

## 2018-05-08 LAB — CYCLIC CITRUL PEPTIDE ANTIBODY, IGG/IGA: Cyclic Citrullin Peptide Ab: 11 units (ref 0–19)

## 2018-06-12 ENCOUNTER — Ambulatory Visit: Payer: BC Managed Care – PPO | Admitting: Internal Medicine

## 2018-06-12 ENCOUNTER — Encounter: Payer: Self-pay | Admitting: *Deleted

## 2018-06-12 ENCOUNTER — Encounter: Payer: Self-pay | Admitting: Internal Medicine

## 2018-06-12 VITALS — BP 104/70 | HR 76 | Ht 63.0 in | Wt 156.1 lb

## 2018-06-12 DIAGNOSIS — K5732 Diverticulitis of large intestine without perforation or abscess without bleeding: Secondary | ICD-10-CM

## 2018-06-12 DIAGNOSIS — K582 Mixed irritable bowel syndrome: Secondary | ICD-10-CM

## 2018-06-12 DIAGNOSIS — R12 Heartburn: Secondary | ICD-10-CM

## 2018-06-12 NOTE — Patient Instructions (Signed)
  We are putting you in for a colon recall for October 2024.   We want you to take 1 teaspoon of benefiber daily, handout provided.   We are giving you a GERD handout to read and follow.   You may take anti-acids such as TUMS as needed and if that doesn't help try Pepcid Complete.   No more citrus per Dr Carlean Purl.    If you think you have diverticulitis please call us and tell the nurse that Dr Carlean Purl said you will need a CT scan.   I appreciate the opportunity to care for you. Silvano Rusk, MD, [facility]

## 2018-06-12 NOTE — Progress Notes (Signed)
Makayla Jackson 52 y.o. [birth year] [id number] Referred by: Eustaquio Maize, MD  Assessment & Plan:   Encounter Diagnoses  Name Primary?  . Heartburn Yes  . Irritable bowel syndrome with both constipation and diarrhea   . Diverticulitis of colon     Plans are as follows:  Colonoscopy recall 05/2023 1 tsp benefiber and titrate up GERD diet - eliminate/reduce citrus Antacids/ then Pepcid complete  I appreciate the opportunity to care for this patient.  CC: Eustaquio Maize, MD  Subjective:   Chief Complaint: Reflux history of diverticulitis small bowel obstruction  HPI The patient is here to establish care, I had seen her years ago, she moved to [place] area her husband was Risk analyst of Police in [place] and they are moving back here as he is retiring.  She had a history of diverticulitis and also surgery and then small bowel obstruction subsequently was hospitalized in [place].  She is doing reasonably well right now though does have alternating loose and then hard stools.  History of colonoscopy in 2014 without polyps but had the diverticulosis.  This was prior to her elective resection because of recurrent and severe diverticulitis.  She is also having heartburn and indigestion at times.  She drinks a fair amount of orange juice.  No dysphagia GI review of systems otherwise negative except for some occasional crampy abdominal pain.    Allergies  Allergen Reactions  . Other Anaphylaxis and Hives    Bee stings  . Shellfish Allergy Swelling  . Penicillins Hives  . Sulfa Antibiotics Hives   Current Meds  Medication Sig  . acetaminophen (TYLENOL) 325 MG tablet Take 650 mg by mouth every 6 (six) hours.  . ALPRAZolam (XANAX) 0.25 MG tablet TAKE 0.5 to 1 TABLET(0.25 MG) BY MOUTH qhs AS NEEDED FOR SLEEP OR ANXIETY  . Azelastine-Fluticasone 137-50 MCG/ACT SUSP 1 spray by Nasal route daily. In each nostril  . cetirizine (ZYRTEC) 10 MG tablet Take 10 mg by  mouth daily.  Marland Kitchen docusate sodium (STOOL SOFTENER) 100 MG capsule Take 100 mg by mouth daily as needed.  . fluticasone (FLOVENT HFA) 110 MCG/ACT inhaler Inhale 2 puffs into the lungs 2 (two) times daily.  . SUMAtriptan (IMITREX) 50 MG tablet Take 1 tablet (50 mg total) by mouth daily as needed for Migraine (may repeat in 2 hours).   Past Medical History:  Diagnosis Date  . Arthritis   . Asthma   . Bowel obstruction ([facility]) 08/2016  . Diverticulitis   . Miscarriage   . Multiple thyroid nodules   . Osteopenia   . Parathyroid tumor   . Pneumonia   . SBO (small bowel obstruction) ([place])   . Thyroid disease    Past Surgical History:  Procedure Laterality Date  . COLON SURGERY  2014   Diverticulosis resection, after diverticulitis  . FINE NEEDLE ASPIRATION     Thyroid Nodule  . KNEE ARTHROSCOPY Right   . NASAL SINUS SURGERY  1998  . PARATHYROIDECTOMY  12/2014  . PARTIAL HYSTERECTOMY  2010  . TONSILLECTOMY     Social History   Social History Narrative   The patient is married.  Her husband is retiring as Occupational hygienist of [employer] after serving in [place].   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[place] 2 glasses of wine daily, no tobacco no drug use   family history includes Arthritis in her father and mother; Arthritis/Rheumatoid in  her maternal aunt, maternal uncle, and mother; Asthma in her mother; Breast cancer in her cousin; Cancer in her paternal grandfather; Colon cancer in her cousin; Colon polyps in her father and mother; Diabetes in her maternal grandmother; Diverticulitis in her father; Heart disease in her father and mother; Hypertension in her father and mother; Lung cancer in her maternal grandfather; Stroke in her mother; Thyroid cancer in her cousin and mother; Thyroid disease in her mother; Vision loss in her mother.   Review of Systems As per HPI all other review of systems negative  Objective:   Physical Exam @BP  104/70 (BP  Location: Left Arm, Patient Position: Sitting, Cuff Size: Normal)   Pulse 76   Ht 5\' 3"  (1.6 m) Comment: height measured without shoes  Wt 156 lb 2 oz (70.8 kg)   BMI 27.66 kg/m @  General:  Well-developed, well-nourished and in no acute distress Eyes:  anicteric. ENT:   Mouth and posterior pharynx free of lesions.  Neck:   supple w/o thyromegaly or mass.  Lungs: Clear to auscultation bilaterally. Heart:  S1S2, no rubs, murmurs, gallops. Abdomen:  soft, non-tender, no hepatosplenomegaly, hernia, or mass and BS+.  Lymph:  no cervical or supraclavicular adenopathy. Extremities:   no edema, cyanosis or clubbing Skin   no rash. Neuro:  A&O x 3.  Psych:  appropriate mood and  Affect.   Data Reviewed: [employer] healthcare records in care everywhere including colonoscopy CT reports hospitalization records and labs.  May 07, 2018 normal comprehensive metabolic panel here in [place] normal CBC also

## 2018-06-17 LAB — HM MAMMOGRAPHY

## 2018-06-18 ENCOUNTER — Encounter: Payer: Self-pay | Admitting: Internal Medicine

## 2018-06-18 DIAGNOSIS — K582 Mixed irritable bowel syndrome: Secondary | ICD-10-CM | POA: Insufficient documentation

## 2018-06-27 ENCOUNTER — Encounter: Payer: Self-pay | Admitting: Family

## 2018-06-27 ENCOUNTER — Ambulatory Visit: Payer: BC Managed Care – PPO | Admitting: Family

## 2018-06-27 VITALS — BP 100/65 | HR 78 | Temp 98.3°F | Ht 63.0 in | Wt 158.6 lb

## 2018-06-27 DIAGNOSIS — J0100 Acute maxillary sinusitis, unspecified: Secondary | ICD-10-CM

## 2018-06-27 MED ORDER — DOXYCYCLINE HYCLATE 100 MG PO TABS: 100.0000 mg | ORAL_TABLET | Freq: Two times a day (BID) | ORAL | 0 refills | Status: DC

## 2018-06-27 NOTE — Progress Notes (Signed)
   Subjective:    Patient ID: Makayla Jackson, female    DOB: Jul 15, 1966, 52 y.o.   MRN: [id number]  Chief Complaint  Patient presents with  . Sinus Problem    Sinusitis  This is a new problem. The current episode started 1 to 4 weeks ago. The problem has been gradually worsening since onset. Maximum temperature: 99.9. Her pain is at a severity of 8/10. The pain is moderate. Associated symptoms include congestion, coughing, ear pain, headaches, a hoarse voice, sinus pressure and sneezing. Past treatments include oral decongestants and acetaminophen. The treatment provided mild relief.      Review of Systems  HENT: Positive for congestion, ear pain, hoarse voice, sinus pressure and sneezing.   Respiratory: Positive for cough.   Neurological: Positive for headaches.  All other systems reviewed and are negative.      Objective:   Physical Exam  Constitutional: She is oriented to person, place, and time. She appears well-developed and well-nourished. No distress.  HENT:  Head: Normocephalic and atraumatic.  Right Ear: External ear normal.  Left Ear: External ear normal.  Nose: Mucosal edema and rhinorrhea present. Right sinus exhibits maxillary sinus tenderness and frontal sinus tenderness. Left sinus exhibits maxillary sinus tenderness and frontal sinus tenderness.  Mouth/Throat: Posterior oropharyngeal erythema present.  Eyes: Pupils are equal, round, and reactive to light.  Neck: Normal range of motion. Neck supple. No thyromegaly present.  Cardiovascular: Normal rate, regular rhythm, normal heart sounds and intact distal pulses.  No murmur heard. Pulmonary/Chest: Effort normal and breath sounds normal. No respiratory distress. She has no wheezes.  Abdominal: Soft. Bowel sounds are normal. She exhibits no distension. There is no tenderness.  Musculoskeletal: Normal range of motion. She exhibits no edema or tenderness.  Neurological: She is alert and oriented to person, place, and  time. She has normal reflexes. No cranial nerve deficit.  Skin: Skin is warm and dry.  Psychiatric: She has a normal mood and affect. Her behavior is normal. Judgment and thought content normal.  Vitals reviewed.     BP 100/65   Pulse 78   Temp 98.3 F (36.8 C) (Oral)   Ht 5\' 3"  (1.6 m)   Wt 158 lb 9.6 oz (71.9 kg)   BMI 28.09 kg/m      Assessment & Plan:  Makayla Jackson comes in today with chief complaint of Sinus Problem   Diagnosis and orders addressed:  1. Acute non-recurrent maxillary sinusitis - Take meds as prescribed - Use a cool mist humidifier  -Use saline nose sprays frequently -Force fluids -For any cough or congestion  Use plain Mucinex- regular strength or max strength is fine -For fever or aces or pains- take tylenol or ibuprofen. -Throat lozenges if help -RTO if symptoms worsen or do not improve  - doxycycline (VIBRA-TABS) 100 MG tablet; Take 1 tablet (100 mg total) by mouth 2 (two) times daily.  Dispense: 20 tablet; Refill: 0   Evelina Dun, FNP

## 2018-06-27 NOTE — Patient Instructions (Signed)

## 2018-08-07 ENCOUNTER — Ambulatory Visit: Payer: BC Managed Care – PPO | Admitting: Pediatrics

## 2018-08-11 ENCOUNTER — Encounter: Payer: Self-pay | Admitting: Pediatrics

## 2018-08-11 ENCOUNTER — Ambulatory Visit: Payer: BC Managed Care – PPO | Admitting: Pediatrics

## 2018-08-11 ENCOUNTER — Ambulatory Visit (INDEPENDENT_AMBULATORY_CARE_PROVIDER_SITE_OTHER): Payer: BC Managed Care – PPO

## 2018-08-11 VITALS — BP 121/84 | HR 76 | Temp 98.6°F | Ht 63.0 in | Wt 158.8 lb

## 2018-08-11 DIAGNOSIS — M722 Plantar fascial fibromatosis: Secondary | ICD-10-CM | POA: Diagnosis not present

## 2018-08-11 DIAGNOSIS — E041 Nontoxic single thyroid nodule: Secondary | ICD-10-CM

## 2018-08-11 DIAGNOSIS — G4700 Insomnia, unspecified: Secondary | ICD-10-CM

## 2018-08-11 DIAGNOSIS — M542 Cervicalgia: Secondary | ICD-10-CM | POA: Diagnosis not present

## 2018-08-11 MED ORDER — ALPRAZOLAM 0.25 MG PO TABS: ORAL_TABLET | ORAL | 5 refills | Status: DC

## 2018-08-11 MED ORDER — CYCLOBENZAPRINE HCL 5 MG PO TABS: 5.0000 mg | ORAL_TABLET | Freq: Three times a day (TID) | ORAL | 1 refills | Status: DC | PRN

## 2018-08-11 NOTE — Progress Notes (Signed)
  Subjective:   Patient ID: Makayla Jackson, female    DOB: 1966-05-06, 53 y.o.   MRN: [id number] CC: Medical Management of Chronic Issues; Foot Pain; and Neck Pain  HPI: Makayla Jackson is a 53 y.o. female   Neck pain: Has been seeing a Restaurant manager, fast food.  Wonders if pillow could be causing some of pain.  Neck feels stiff with certain movements during the day, aching primarily on the left side around to her front.  Feels tight.  Insomnia: Taking alprazolam as needed, not taking every night  Thyroid nodule: Has middle left 2.2 cm and lower right 1.6 cm thyroid nodules.  Last ultrasound available from 2015.  She was last seen by her endocrinologist who is following her thyroid nodules in [place], [place] in 2018.  She is s/p left superior parathyroidectomy in 2016.  She had an FNAB in 2015 of both of her thyroid nodules pathology was benign.  She had a biopsy of left thyroid nodule in 06/2016 which was nondiagnostic cyst fluid only.  Foot pain: Bilateral, over arches of her foot, history of plantar fasciitis, trying stretches, good arch supports, ongoing symptoms.  Wants to see podiatry.  Relevant past medical, surgical, family and social history reviewed. Allergies and medications reviewed and updated. Social History   Tobacco Use  Smoking Status Never Smoker  Smokeless Tobacco Never Used   ROS: Per HPI   Objective:    BP 121/84   Pulse 76   Temp 98.6 F (37 C) (Oral)   Ht 5\' 3"  (1.6 m)   Wt 158 lb 12.8 oz (72 kg)   BMI 28.13 kg/m   Wt Readings from Last 3 Encounters:  08/11/18 158 lb 12.8 oz (72 kg)  06/27/18 158 lb 9.6 oz (71.9 kg)  06/12/18 156 lb 2 oz (70.8 kg)    Gen: NAD, alert, cooperative with exam, NCAT EYES: EOMI, no conjunctival injection, or no icterus ENT:  TMs pearly gray b/l, OP without erythema LYMPH: no cervical LAD CV: NRRR, normal S1/S2, no murmur, distal pulses 2+ b/l Resp: CTABL, no wheezes, normal WOB Abd: +BS, soft, NTND. no guarding or  organomegaly Ext: No edema, warm Neuro: Alert and oriented MSK: decreased range of motion neck turning head side to side, worse left side.  Assessment & Plan:  Makayla Jackson was seen today for medical management of chronic issues, foot pain and neck pain.  Diagnoses and all orders for this visit:  Neck pain Will get x-ray.  Trial Flexeril, take half to 1 full tab.  Can cause drowsiness.  Do not take and drive.  Avoid taking with other medicines as drowsiness such as the Xanax. -     DG Cervical Spine Complete; Future -     cyclobenzaprine (FLEXERIL) 5 MG tablet; Take 1 tablet (5 mg total) by mouth 3 (three) times daily as needed for muscle spasms.  Insomnia, unspecified type Stable, take below as needed -     ALPRAZolam (XANAX) 0.25 MG tablet; TAKE 0.5 to 1 TABLET(0.25 MG) BY MOUTH qhs AS NEEDED FOR SLEEP OR ANXIETY  Thyroid nodule Was getting yearly follow-ups with endocrinology for thyroid nodules.  Last follow-up appointment 10/2016.  We received records from endocrinology 10, making copy patient and scanning records into chart. -     Ambulatory referral to Endocrinology  Plantar fasciitis -     Ambulatory referral to Podiatry   Follow up plan: Return in about 6 months (around 02/09/2019). Assunta Found, MD [place]

## 2018-08-15 ENCOUNTER — Encounter: Payer: Self-pay | Admitting: Pediatrics

## 2018-08-25 ENCOUNTER — Encounter: Payer: Self-pay | Admitting: Podiatry

## 2018-08-25 ENCOUNTER — Ambulatory Visit (INDEPENDENT_AMBULATORY_CARE_PROVIDER_SITE_OTHER): Payer: BC Managed Care – PPO

## 2018-08-25 ENCOUNTER — Other Ambulatory Visit: Payer: Self-pay | Admitting: Podiatry

## 2018-08-25 ENCOUNTER — Ambulatory Visit: Payer: BC Managed Care – PPO | Admitting: Podiatry

## 2018-08-25 VITALS — BP 126/73 | HR 79

## 2018-08-25 DIAGNOSIS — M21619 Bunion of unspecified foot: Secondary | ICD-10-CM | POA: Diagnosis not present

## 2018-08-25 DIAGNOSIS — M79671 Pain in right foot: Secondary | ICD-10-CM | POA: Diagnosis not present

## 2018-08-25 DIAGNOSIS — M722 Plantar fascial fibromatosis: Secondary | ICD-10-CM | POA: Diagnosis not present

## 2018-08-25 DIAGNOSIS — M79672 Pain in left foot: Secondary | ICD-10-CM

## 2018-08-25 MED ORDER — METHYLPREDNISOLONE 4 MG PO TBPK: ORAL_TABLET | ORAL | 0 refills | Status: DC

## 2018-08-25 MED ORDER — TRIAMCINOLONE ACETONIDE 10 MG/ML IJ SUSP
10.0000 mg | Freq: Once | INTRAMUSCULAR | Status: AC
  Administered 2018-08-25: 10 mg

## 2018-08-25 NOTE — Patient Instructions (Signed)
Plantar Fasciitis (Heel Spur Syndrome) with Rehab The plantar fascia is a fibrous, ligament-like, soft-tissue structure that spans the bottom of the foot. Plantar fasciitis is a condition that causes pain in the foot due to inflammation of the tissue. SYMPTOMS   Pain and tenderness on the underneath side of the foot.  Pain that worsens with standing or walking. CAUSES  Plantar fasciitis is caused by irritation and injury to the plantar fascia on the underneath side of the foot. Common mechanisms of injury include:  Direct trauma to bottom of the foot.  Damage to a small nerve that runs under the foot where the main fascia attaches to the heel bone.  Stress placed on the plantar fascia due to bone spurs. RISK INCREASES WITH:   Activities that place stress on the plantar fascia (running, jumping, pivoting, or cutting).  Poor strength and flexibility.  Improperly fitted shoes.  Tight calf muscles.  Flat feet.  Failure to warm-up properly before activity.  Obesity. PREVENTION  Warm up and stretch properly before activity.  Allow for adequate recovery between workouts.  Maintain physical fitness:  Strength, flexibility, and endurance.  Cardiovascular fitness.  Maintain a health body weight.  Avoid stress on the plantar fascia.  Wear properly fitted shoes, including arch supports for individuals who have flat feet.  PROGNOSIS  If treated properly, then the symptoms of plantar fasciitis usually resolve without surgery. However, occasionally surgery is necessary.  RELATED COMPLICATIONS   Recurrent symptoms that may result in a chronic condition.  Problems of the lower back that are caused by compensating for the injury, such as limping.  Pain or weakness of the foot during push-off following surgery.  Chronic inflammation, scarring, and partial or complete fascia tear, occurring more often from repeated injections.  TREATMENT  Treatment initially involves the  use of ice and medication to help reduce pain and inflammation. The use of strengthening and stretching exercises may help reduce pain with activity, especially stretches of the Achilles tendon. These exercises may be performed at home or with a therapist. Your caregiver may recommend that you use heel cups of arch supports to help reduce stress on the plantar fascia. Occasionally, corticosteroid injections are given to reduce inflammation. If symptoms persist for greater than 6 months despite non-surgical (conservative), then surgery may be recommended.   MEDICATION   If pain medication is necessary, then nonsteroidal anti-inflammatory medications, such as aspirin and ibuprofen, or other minor pain relievers, such as acetaminophen, are often recommended.  Do not take pain medication within 7 days before surgery.  Prescription pain relievers may be given if deemed necessary by your caregiver. Use only as directed and only as much as you need.  Corticosteroid injections may be given by your caregiver. These injections should be reserved for the most serious cases, because they may only be given a certain number of times.  HEAT AND COLD  Cold treatment (icing) relieves pain and reduces inflammation. Cold treatment should be applied for 10 to 15 minutes every 2 to 3 hours for inflammation and pain and immediately after any activity that aggravates your symptoms. Use ice packs or massage the area with a piece of ice (ice massage).  Heat treatment may be used prior to performing the stretching and strengthening activities prescribed by your caregiver, physical therapist, or athletic trainer. Use a heat pack or soak the injury in warm water.  SEEK IMMEDIATE MEDICAL CARE IF:  Treatment seems to offer no benefit, or the condition worsens.  Any medications   produce adverse side effects.  EXERCISES- RANGE OF MOTION (ROM) AND STRETCHING EXERCISES - Plantar Fasciitis (Heel Spur Syndrome) These exercises  may help you when beginning to rehabilitate your injury. Your symptoms may resolve with or without further involvement from your physician, physical therapist or athletic trainer. While completing these exercises, remember:   Restoring tissue flexibility helps normal motion to return to the joints. This allows healthier, less painful movement and activity.  An effective stretch should be held for at least 30 seconds.  A stretch should never be painful. You should only feel a gentle lengthening or release in the stretched tissue.  RANGE OF MOTION - Toe Extension, Flexion  Sit with your right / left leg crossed over your opposite knee.  Grasp your toes and gently pull them back toward the top of your foot. You should feel a stretch on the bottom of your toes and/or foot.  Hold this stretch for 10 seconds.  Now, gently pull your toes toward the bottom of your foot. You should feel a stretch on the top of your toes and or foot.  Hold this stretch for 10 seconds. Repeat  times. Complete this stretch 3 times per day.   RANGE OF MOTION - Ankle Dorsiflexion, Active Assisted  Remove shoes and sit on a chair that is preferably not on a carpeted surface.  Place right / left foot under knee. Extend your opposite leg for support.  Keeping your heel down, slide your right / left foot back toward the chair until you feel a stretch at your ankle or calf. If you do not feel a stretch, slide your bottom forward to the edge of the chair, while still keeping your heel down.  Hold this stretch for 10 seconds. Repeat 3 times. Complete this stretch 2 times per day.   STRETCH  Gastroc, Standing  Place hands on wall.  Extend right / left leg, keeping the front knee somewhat bent.  Slightly point your toes inward on your back foot.  Keeping your right / left heel on the floor and your knee straight, shift your weight toward the wall, not allowing your back to arch.  You should feel a gentle stretch  in the right / left calf. Hold this position for 10 seconds. Repeat 3 times. Complete this stretch 2 times per day.  STRETCH  Soleus, Standing  Place hands on wall.  Extend right / left leg, keeping the other knee somewhat bent.  Slightly point your toes inward on your back foot.  Keep your right / left heel on the floor, bend your back knee, and slightly shift your weight over the back leg so that you feel a gentle stretch deep in your back calf.  Hold this position for 10 seconds. Repeat 3 times. Complete this stretch 2 times per day.  STRETCH  Gastrocsoleus, Standing  Note: This exercise can place a lot of stress on your foot and ankle. Please complete this exercise only if specifically instructed by your caregiver.   Place the ball of your right / left foot on a step, keeping your other foot firmly on the same step.  Hold on to the wall or a rail for balance.  Slowly lift your other foot, allowing your body weight to press your heel down over the edge of the step.  You should feel a stretch in your right / left calf.  Hold this position for 10 seconds.  Repeat this exercise with a slight bend in your right /   left knee. Repeat 3 times. Complete this stretch 2 times per day.   STRENGTHENING EXERCISES - Plantar Fasciitis (Heel Spur Syndrome)  These exercises may help you when beginning to rehabilitate your injury. They may resolve your symptoms with or without further involvement from your physician, physical therapist or athletic trainer. While completing these exercises, remember:   Muscles can gain both the endurance and the strength needed for everyday activities through controlled exercises.  Complete these exercises as instructed by your physician, physical therapist or athletic trainer. Progress the resistance and repetitions only as guided.  STRENGTH - Towel Curls  Sit in a chair positioned on a non-carpeted surface.  Place your foot on a towel, keeping your heel  on the floor.  Pull the towel toward your heel by only curling your toes. Keep your heel on the floor. Repeat 3 times. Complete this exercise 2 times per day.  STRENGTH - Ankle Inversion  Secure one end of a rubber exercise band/tubing to a fixed object (table, pole). Loop the other end around your foot just before your toes.  Place your fists between your knees. This will focus your strengthening at your ankle.  Slowly, pull your big toe up and in, making sure the band/tubing is positioned to resist the entire motion.  Hold this position for 10 seconds.  Have your muscles resist the band/tubing as it slowly pulls your foot back to the starting position. Repeat 3 times. Complete this exercises 2 times per day.  Document Released: 07/23/2005 Document Revised: 10/15/2011 Document Reviewed: 11/04/2008 Exit[employer] Patient Information 2014 [employer].    Bunion  A bunion is a bump on the base of the big toe that forms when the bones of the big toe joint move out of position. Bunions may be small at first, but they often get larger over time. They can make walking painful. What are the causes? A bunion may be caused by:  Wearing narrow or pointed shoes that force the big toe to press against the other toes.  Abnormal foot development that causes the foot to roll inward (pronate).  Changes in the foot that are caused by certain diseases, such as rheumatoid arthritis or polio.  A foot injury. What increases the risk? The following factors may make you more likely to develop this condition:  Wearing shoes that squeeze the toes together.  Having certain diseases, such as: ? Rheumatoid arthritis. ? Polio. ? Cerebral palsy.  Having family members who have bunions.  Being born with a foot deformity, such as flat feet or low arches.  Doing activities that put a lot of pressure on the feet, such as ballet dancing. What are the signs or symptoms? The main symptom of a bunion is a  noticeable bump on the big toe. Other symptoms may include:  Pain.  Swelling around the big toe.  Redness and inflammation.  Thick or hardened skin on the big toe or between the toes.  Stiffness or loss of motion in the big toe.  Trouble with walking. How is this diagnosed? A bunion may be diagnosed based on your symptoms, medical history, and activities. You may have tests, such as:  X-rays. These allow your health care provider to check the position of the bones in your foot and look for damage to your joint. They also help your health care provider determine the severity of your bunion and the best way to treat it.  Joint aspiration. In this test, a sample of fluid is removed from   the toe joint. This test may be done if you are in a lot of pain. It helps rule out diseases that cause painful swelling of the joints, such as arthritis. How is this treated? Treatment depends on the severity of your symptoms. The goal of treatment is to relieve symptoms and prevent the bunion from getting worse. Your health care provider may recommend:  Wearing shoes that have a wide toe box.  Using bunion pads to cushion the affected area.  Taping your toes together to keep them in a normal position.  Placing a device inside your shoe (orthotics) to help reduce pressure on your toe joint.  Taking medicine to ease pain, inflammation, and swelling.  Applying heat or ice to the affected area.  Doing stretching exercises.  Surgery to remove scar tissue and move the toes back into their normal position. This treatment is rare. Follow these instructions at home: Managing pain, stiffness, and swelling   If directed, put ice on the painful area: ? Put ice in a plastic bag. ? Place a towel between your skin and the bag. ? Leave the ice on for 20 minutes, 2-3 times a day. Activity   If directed, apply heat to the affected area before you exercise. Use the heat source that your health care  provider recommends, such as a moist heat pack or a heating pad. ? Place a towel between your skin and the heat source. ? Leave the heat on for 20-30 minutes. ? Remove the heat if your skin turns bright red. This is especially important if you are unable to feel pain, heat, or cold. You may have a greater risk of getting burned.  Do exercises as told by your health care provider. General instructions  Support your toe joint with proper footwear, shoe padding, or taping as told by your health care provider.  Take over-the-counter and prescription medicines only as told by your health care provider.  Keep all follow-up visits as told by your health care provider. This is important. Contact a health care provider if your symptoms:  Get worse.  Do not improve in 2 weeks. Get help right away if you have:  Severe pain and trouble with walking. Summary  A bunion is a bump on the base of the big toe that forms when the bones of the big toe joint move out of position.  Bunions can make walking painful.  Treatment depends on the severity of your symptoms.  Support your toe joint with proper footwear, shoe padding, or taping as told by your health care provider. This information is not intended to replace advice given to you by your health care provider. Make sure you discuss any questions you have with your health care provider. Document Released: 07/23/2005 Document Revised: 12/03/2017 Document Reviewed: 12/03/2017 [employer]vier Interactive Patient Education  2019 [employer]  

## 2018-08-26 NOTE — Progress Notes (Signed)
Subjective:   Patient ID: Makayla Jackson, female   DOB: 53 y.o.   MRN: [id number]   HPI Patient presents stating she has had a lot of pain in both heels and states that it is been making it difficult for her to be active.  States that she also has a bunion on the right foot which is becoming tender and that she is tried wider shoes for this without relief.  Patient does not smoke and likes to be active   Review of Systems  All other systems reviewed and are negative.       Objective:  Physical Exam Vitals signs and nursing note reviewed.  Constitutional:      Appearance: She is well-developed.  Pulmonary:     Effort: Pulmonary effort is normal.  Musculoskeletal: Normal range of motion.  Skin:    General: Skin is warm.  Neurological:     Mental Status: She is alert.     Neurovascular status found to be intact muscle strength is adequate range of motion within normal limits with patient noted to have exquisite discomfort plantar aspect heel region bilateral with inflammation fluid buildup and a moderate prominent first metatarsal right with redness around the area and what appears to be some nerve compression irritation occurring.  Patient was noted to have good digital perfusion well oriented x3     Assessment:  Acute plantar fasciitis bilateral with inflammation fluid in the medial band and structural bunion deformity right with probable nerve irritation of the medial dorsal cutaneous nerve     Plan:  H&P conditions reviewed and were focusing on the heels first.  I did sterile prep and injected the plantar fascial bilateral 3 mg Kenalog 5 mg Xylocaine and applied fascial brace bilateral.  Discussed long-term orthotics and also at this time did discuss the bunion deformity and solutions at one point in future.  Patient will be seen back to recheck in the next several weeks and was encouraged to stretch the foot and to utilize shoe gear modification  X-rays indicate small spur  formation bilateral moderate depression of the arch with moderate bunion deformity right over left

## 2018-09-01 ENCOUNTER — Ambulatory Visit (INDEPENDENT_AMBULATORY_CARE_PROVIDER_SITE_OTHER): Payer: BC Managed Care – PPO | Admitting: Podiatry

## 2018-09-01 ENCOUNTER — Encounter: Payer: Self-pay | Admitting: Podiatry

## 2018-09-01 DIAGNOSIS — M722 Plantar fascial fibromatosis: Secondary | ICD-10-CM | POA: Diagnosis not present

## 2018-09-01 MED ORDER — TRIAMCINOLONE ACETONIDE 10 MG/ML IJ SUSP
10.0000 mg | Freq: Once | INTRAMUSCULAR | Status: AC
  Administered 2018-09-01: 10 mg

## 2018-09-03 NOTE — Progress Notes (Signed)
Subjective:   Patient ID: Makayla Jackson, female   DOB: 53 y.o.   MRN: [id number]   HPI Patient continues to complain of heel pain left over right stating that she feels like she needs more support with improvement that is occurred to a degree but still painful   ROS      Objective:  Physical Exam  Neurovascular status intact muscle strength adequate pain in the left plantar heel that is improving but is still painful upon deep palpation with depression of arch noted     Assessment:  Acute fasciitis that is improved but still present with mechanical dysfunction     Plan:  H&P education rendered and today I went ahead and did sterile prep and reinjected the plantar fascia 3 mg Kenalog 5 g Xylocaine and went ahead and casted for functional type orthotics in order to support the arch and prevent future pathology.  Reappoint when ready and will be seen by specialist

## 2018-09-09 ENCOUNTER — Ambulatory Visit: Payer: BC Managed Care – PPO | Admitting: Endocrinology

## 2018-09-22 ENCOUNTER — Other Ambulatory Visit: Payer: BC Managed Care – PPO | Admitting: Orthotics

## 2018-09-29 ENCOUNTER — Telehealth: Payer: Self-pay | Admitting: Pediatrics

## 2018-09-29 ENCOUNTER — Ambulatory Visit: Payer: BC Managed Care – PPO | Admitting: Endocrinology

## 2018-09-29 NOTE — Telephone Encounter (Signed)
appt scheduled

## 2018-09-30 ENCOUNTER — Encounter: Payer: Self-pay | Admitting: Family

## 2018-09-30 ENCOUNTER — Ambulatory Visit: Payer: BC Managed Care – PPO | Admitting: Family

## 2018-09-30 VITALS — BP 130/86 | HR 72 | Temp 97.8°F | Ht 63.0 in | Wt 158.2 lb

## 2018-09-30 DIAGNOSIS — J0100 Acute maxillary sinusitis, unspecified: Secondary | ICD-10-CM | POA: Diagnosis not present

## 2018-09-30 MED ORDER — DOXYCYCLINE HYCLATE 100 MG PO TABS: 100.0000 mg | ORAL_TABLET | Freq: Two times a day (BID) | ORAL | 0 refills | Status: DC

## 2018-09-30 NOTE — Patient Instructions (Signed)
Sinusitis, Adult  Sinusitis is inflammation of your sinuses. Sinuses are hollow spaces in the bones around your face. Your sinuses are located:   Around your eyes.   In the middle of your forehead.   Behind your nose.   In your cheekbones.  Mucus normally drains out of your sinuses. When your nasal tissues become inflamed or swollen, mucus can become trapped or blocked. This allows bacteria, viruses, and fungi to grow, which leads to infection. Most infections of the sinuses are caused by a virus.  Sinusitis can develop quickly. It can last for up to 4 weeks (acute) or for more than 12 weeks (chronic). Sinusitis often develops after a cold.  What are the causes?  This condition is caused by anything that creates swelling in the sinuses or stops mucus from draining. This includes:   Allergies.   Asthma.   Infection from bacteria or viruses.   Deformities or blockages in your nose or sinuses.   Abnormal growths in the nose (nasal polyps).   Pollutants, such as chemicals or irritants in the air.   Infection from fungi (rare).  What increases the risk?  You are more likely to develop this condition if you:   Have a weak body defense system (immune system).   Do a lot of swimming or diving.   Overuse nasal sprays.   Smoke.  What are the signs or symptoms?  The main symptoms of this condition are pain and a feeling of pressure around the affected sinuses. Other symptoms include:   Stuffy nose or congestion.   Thick drainage from your nose.   Swelling and warmth over the affected sinuses.   Headache.   Upper toothache.   A cough that may get worse at night.   Extra mucus that collects in the throat or the back of the nose (postnasal drip).   Decreased sense of smell and taste.   Fatigue.   A fever.   Sore throat.   Bad breath.  How is this diagnosed?  This condition is diagnosed based on:   Your symptoms.   Your medical history.   A physical exam.   Tests to find out if your condition is  acute or chronic. This may include:  ? Checking your nose for nasal polyps.  ? Viewing your sinuses using a device that has a light (endoscope).  ? Testing for allergies or bacteria.  ? Imaging tests, such as an MRI or CT scan.  In rare cases, a bone biopsy may be done to rule out more serious types of fungal sinus disease.  How is this treated?  Treatment for sinusitis depends on the cause and whether your condition is chronic or acute.   If caused by a virus, your symptoms should go away on their own within 10 days. You may be given medicines to relieve symptoms. They include:  ? Medicines that shrink swollen nasal passages (topical intranasal decongestants).  ? Medicines that treat allergies (antihistamines).  ? A spray that eases inflammation of the nostrils (topical intranasal corticosteroids).  ? Rinses that help get rid of thick mucus in your nose (nasal saline washes).   If caused by bacteria, your health care provider may recommend waiting to see if your symptoms improve. Most bacterial infections will get better without antibiotic medicine. You may be given antibiotics if you have:  ? A severe infection.  ? A weak immune system.   If caused by narrow nasal passages or nasal polyps, you may need   to have surgery.  Follow these instructions at home:  Medicines   Take, use, or apply over-the-counter and prescription medicines only as told by your health care provider. These may include nasal sprays.   If you were prescribed an antibiotic medicine, take it as told by your health care provider. Do not stop taking the antibiotic even if you start to feel better.  Hydrate and humidify     Drink enough fluid to keep your urine pale yellow. Staying hydrated will help to thin your mucus.   Use a cool mist humidifier to keep the humidity level in your home above 50%.   Inhale steam for 10-15 minutes, 3-4 times a day, or as told by your health care provider. You can do this in the bathroom while a hot shower is  running.   Limit your exposure to cool or dry air.  Rest   Rest as much as possible.   Sleep with your head raised (elevated).   Make sure you get enough sleep each night.  General instructions     Apply a warm, moist washcloth to your face 3-4 times a day or as told by your health care provider. This will help with discomfort.   Wash your hands often with soap and water to reduce your exposure to germs. If soap and water are not available, use hand sanitizer.   Do not smoke. Avoid being around people who are smoking (secondhand smoke).   Keep all follow-up visits as told by your health care provider. This is important.  Contact a health care provider if:   You have a fever.   Your symptoms get worse.   Your symptoms do not improve within 10 days.  Get help right away if:   You have a severe headache.   You have persistent vomiting.   You have severe pain or swelling around your face or eyes.   You have vision problems.   You develop confusion.   Your neck is stiff.   You have trouble breathing.  Summary   Sinusitis is soreness and inflammation of your sinuses. Sinuses are hollow spaces in the bones around your face.   This condition is caused by nasal tissues that become inflamed or swollen. The swelling traps or blocks the flow of mucus. This allows bacteria, viruses, and fungi to grow, which leads to infection.   If you were prescribed an antibiotic medicine, take it as told by your health care provider. Do not stop taking the antibiotic even if you start to feel better.   Keep all follow-up visits as told by your health care provider. This is important.  This information is not intended to replace advice given to you by your health care provider. Make sure you discuss any questions you have with your health care provider.  Document Released: 07/23/2005 Document Revised: 12/23/2017 Document Reviewed: 12/23/2017  Elsevier Interactive Patient Education  2019 [employer]

## 2018-09-30 NOTE — Progress Notes (Signed)
Subjective:    Patient ID: Makayla Jackson, female    DOB: 01-01-1966, 53 y.o.   MRN: [id number]  Chief Complaint  Patient presents with  . Sinus Problem    facial pressure, drainage down throat  . Headache    Sinus Problem  This is a new problem. The current episode started 1 to 4 weeks ago. The problem has been gradually worsening since onset. Maximum temperature: 99. Her pain is at a severity of 7/10. The pain is moderate. Associated symptoms include congestion, coughing, ear pain, headaches, a hoarse voice, sinus pressure and a sore throat. Pertinent negatives include no chills. Past treatments include lying down and oral decongestants. The treatment provided mild relief.  Headache   Associated symptoms include coughing, ear pain, sinus pressure and a sore throat.      Review of Systems  Constitutional: Negative for chills.  HENT: Positive for congestion, ear pain, hoarse voice, sinus pressure and sore throat.   Respiratory: Positive for cough.   Neurological: Positive for headaches.  All other systems reviewed and are negative.      Objective:   Physical Exam Vitals signs reviewed.  Constitutional:      General: She is not in acute distress.    Appearance: She is well-developed.  HENT:     Head: Normocephalic and atraumatic.     Right Ear: External ear normal.     Nose: Mucosal edema and rhinorrhea present.     Right Sinus: Maxillary sinus tenderness and frontal sinus tenderness present.     Left Sinus: Maxillary sinus tenderness and frontal sinus tenderness present.     Mouth/Throat:     Pharynx: Posterior oropharyngeal erythema present.  Eyes:     Pupils: Pupils are equal, round, and reactive to light.  Neck:     Musculoskeletal: Normal range of motion and neck supple.     Thyroid: No thyromegaly.  Cardiovascular:     Rate and Rhythm: Normal rate and regular rhythm.     Heart sounds: Normal heart sounds. No murmur.  Pulmonary:     Effort: Pulmonary effort is  normal. No respiratory distress.     Breath sounds: Normal breath sounds. No wheezing.  Abdominal:     General: Bowel sounds are normal. There is no distension.     Palpations: Abdomen is soft.     Tenderness: There is no abdominal tenderness.  Musculoskeletal: Normal range of motion.        General: No tenderness.  Skin:    General: Skin is warm and dry.  Neurological:     Mental Status: She is alert and oriented to person, place, and time.     Cranial Nerves: No cranial nerve deficit.     Deep Tendon Reflexes: Reflexes are normal and symmetric.  Psychiatric:        Behavior: Behavior normal.        Thought Content: Thought content normal.        Judgment: Judgment normal.       BP 130/86   Pulse 72   Temp 97.8 F (36.6 C) (Oral)   Ht 5\' 3"  (1.6 m)   Wt 158 lb 3.2 oz (71.8 kg)   BMI 28.02 kg/m      Assessment & Plan:  Makayla Jackson comes in today with chief complaint of Sinus Problem (facial pressure, drainage down throat) and Headache   Diagnosis and orders addressed:  1. Acute maxillary sinusitis, recurrence not specified - Take meds as prescribed -  Use a cool mist humidifier  -Use saline nose sprays frequently -Force fluids -For any cough or congestion  Use plain Mucinex- regular strength or max strength is fine -For fever or aces or pains- take tylenol or ibuprofen. -Throat lozenges if help -New toothbrush in 3 days RTO if symptoms worsen or do not improve  - doxycycline (VIBRA-TABS) 100 MG tablet; Take 1 tablet (100 mg total) by mouth 2 (two) times daily.  Dispense: 20 tablet; Refill: 0   Evelina Dun, FNP

## 2018-10-08 ENCOUNTER — Encounter: Payer: Self-pay | Admitting: Endocrinology

## 2018-10-08 ENCOUNTER — Ambulatory Visit (INDEPENDENT_AMBULATORY_CARE_PROVIDER_SITE_OTHER): Payer: BC Managed Care – PPO | Admitting: Endocrinology

## 2018-10-08 VITALS — BP 124/68 | HR 77 | Ht 63.0 in | Wt 156.4 lb

## 2018-10-08 DIAGNOSIS — E041 Nontoxic single thyroid nodule: Secondary | ICD-10-CM | POA: Diagnosis not present

## 2018-10-08 LAB — T4, FREE: Free T4: 0.81 ng/dL (ref 0.60–1.60)

## 2018-10-08 LAB — TSH: TSH: 0.24 u[IU]/mL — ABNORMAL LOW (ref 0.35–4.50)

## 2018-10-08 NOTE — Patient Instructions (Addendum)
Let's recheck the ultrasound.  you will receive a phone call, about a day and time for an appointment.  Blood tests are requested for you today.  We'll let you know about the results. If the blood test is just borderline overactive, we can continue to follow this, or you choose the radioactive iodine pill.   Please come back for a follow-up appointment in 6 months.

## 2018-10-08 NOTE — Progress Notes (Signed)
Subjective:    Patient ID: Makayla Jackson, female    DOB: 17-Jan-1966, 53 y.o.   MRN: [id number]  HPI Pt returns for f/u of MNG (dx'ed 2013; TSH is slightly low; she has never been on therapy for this, but she has had multiple bxs and cyst drainage procedures; she had parathyroidectomy in 2017; last [place] in 2017 showed 1.6 CM SOLID/CYSTIC NODULE RIGHT THYROID.  1.6 CM CYST AND 0.9 CM CYST LEFT THYROID).  pt states she feels well in general.  She does not notice the goiter Past Medical History:  Diagnosis Date  . Arthritis   . Asthma   . Bowel obstruction ([place]) 08/2016  . Diverticulitis   . Miscarriage   . Multiple thyroid nodules   . Osteopenia   . Parathyroid tumor   . Pneumonia   . SBO (small bowel obstruction) ([place])   . Thyroid disease     Past Surgical History:  Procedure Laterality Date  . COLON SURGERY  2014   Diverticulosis resection, after diverticulitis  . FINE NEEDLE ASPIRATION     Thyroid Nodule  . KNEE ARTHROSCOPY Right   . NASAL SINUS SURGERY  1998  . PARATHYROIDECTOMY  12/2014  . PARTIAL HYSTERECTOMY  2010  . TONSILLECTOMY      Social History   Socioeconomic History  . Marital status: Married    Spouse name: Not on file  . Number of children: 3  . Years of education: Not on file  . Highest education level: Not on file  Occupational History  . Occupation: Product manager: [employer]  Social Needs  . Financial resource strain: Not on file  . Food insecurity:    Worry: Not on file    Inability: Not on file  . Transportation needs:    Medical: Not on file    Non-medical: Not on file  Tobacco Use  . Smoking status: Never Smoker  . Smokeless tobacco: Never Used  Substance and Sexual Activity  . Alcohol use: Yes    Alcohol/week: 1.0 standard drinks    Types: 1 Glasses of wine per week    Comment: 2 glasses of wine a day  . Drug use: No  . Sexual activity: Not on file  Lifestyle  . Physical activity:    Days per week: Not on  file    Minutes per session: Not on file  . Stress: Not on file  Relationships  . Social connections:    Talks on phone: Not on file    Gets together: Not on file    Attends religious service: Not on file    Active member of club or organization: Not on file    Attends meetings of clubs or organizations: Not on file    Relationship status: Not on file  . Intimate partner violence:    Fear of current or ex partner: Not on file    Emotionally abused: Not on file    Physically abused: Not on file    Forced sexual activity: Not on file  Other Topics Concern  . Not on file  Social History Narrative   The patient is married.  Her husband is retiring as Occupational hygienist of [employer] after serving in [place].   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[place] 2 glasses of wine daily, no tobacco no drug use    Current Outpatient Medications on File Prior to Visit  Medication Sig  Dispense Refill  . acetaminophen (TYLENOL) 325 MG tablet Take 650 mg by mouth every 6 (six) hours.    . ALPRAZolam (XANAX) 0.25 MG tablet TAKE 0.5 to 1 TABLET(0.25 MG) BY MOUTH qhs AS NEEDED FOR SLEEP OR ANXIETY 30 tablet 5  . Azelastine-Fluticasone 137-50 MCG/ACT SUSP 1 spray by Nasal route daily. In each nostril    . cetirizine (ZYRTEC) 10 MG tablet Take 10 mg by mouth daily.    Marland Kitchen docusate sodium (STOOL SOFTENER) 100 MG capsule Take 100 mg by mouth daily as needed.    . fluticasone (FLOVENT HFA) 110 MCG/ACT inhaler Inhale 2 puffs into the lungs 2 (two) times daily. 1 Inhaler 3  . SUMAtriptan (IMITREX) 50 MG tablet Take 1 tablet (50 mg total) by mouth daily as needed for Migraine (may repeat in 2 hours). 10 tablet 3  . cyclobenzaprine (FLEXERIL) 5 MG tablet Take 1 tablet (5 mg total) by mouth 3 (three) times daily as needed for muscle spasms. (Patient not taking: Reported on 10/08/2018) 30 tablet 1  . doxycycline (VIBRA-TABS) 100 MG tablet Take 1 tablet (100 mg total) by mouth 2 (two)  times daily. (Patient not taking: Reported on 10/08/2018) 20 tablet 0   No current facility-administered medications on file prior to visit.     Allergies  Allergen Reactions  . Other Anaphylaxis and Hives    Bee stings  . Shellfish Allergy Swelling  . Penicillins Hives  . Sulfa Antibiotics Hives  . Prednisone     Pt stated, "It made me feel slightly agitated. I could not sleep well"    Family History  Problem Relation Age of Onset  . Arthritis Mother   . Arthritis/Rheumatoid Mother   . Asthma Mother   . Heart disease Mother   . Hypertension Mother   . Stroke Mother   . Vision loss Mother   . Thyroid disease Mother   . Colon polyps Mother   . Thyroid cancer Mother   . Arthritis Father   . Heart disease Father   . Hypertension Father   . Colon polyps Father   . Diverticulitis Father        with colon surgery  . Arthritis/Rheumatoid Maternal Aunt   . Arthritis/Rheumatoid Maternal Uncle   . Diabetes Maternal Grandmother   . Lung cancer Maternal Grandfather   . Cancer Paternal Grandfather        type unknown  . Colon cancer Cousin   . Thyroid cancer Cousin   . Breast cancer Cousin     BP 124/68 (BP Location: Left Arm, Patient Position: Sitting, Cuff Size: Normal)   Pulse 77   Ht 5\' 3"  (1.6 m)   Wt 156 lb 6.4 oz (70.9 kg)   SpO2 96%   BMI 27.71 kg/m    Review of Systems Denies sob and dysphagia.      Objective:   Physical Exam VITAL SIGNS:  See vs page.  GENERAL: no distress.  Neck: a healed scar is present.  I do not appreciate a nodule in the thyroid or elsewhere in the neck.      Lab Results  Component Value Date   TSH 0.444 (L) 05/07/2018       Assessment & Plan:  MNG: due for recheck.  Low TSH: we discussed.  This reduces the risk of malignancy, but TSH will decrease with time.   Patient Instructions  Let's recheck the ultrasound.  you will receive a phone call, about a day and time for an appointment.  Blood tests are requested for you today.   We'll let you know about the results. If the blood test is just borderline overactive, we can continue to follow this, or you choose the radioactive iodine pill.   Please come back for a follow-up appointment in 6 months.

## 2018-10-16 ENCOUNTER — Other Ambulatory Visit: Payer: Self-pay

## 2018-10-16 ENCOUNTER — Ambulatory Visit: Payer: BC Managed Care – PPO | Admitting: Orthotics

## 2018-10-16 DIAGNOSIS — M722 Plantar fascial fibromatosis: Secondary | ICD-10-CM

## 2018-10-16 DIAGNOSIS — M79671 Pain in right foot: Secondary | ICD-10-CM

## 2018-10-16 NOTE — Progress Notes (Signed)
Patient came in today to pick up custom made foot orthotics.  The goals were accomplished and the patient reported no dissatisfaction with said orthotics.  Patient was advised of breakin period and how to report any issues. 

## 2018-10-22 ENCOUNTER — Ambulatory Visit
Admission: RE | Admit: 2018-10-22 | Discharge: 2018-10-22 | Disposition: A | Discharge status: Home / Self Care | Payer: BC Managed Care – PPO | Source: Ambulatory Visit | Attending: Endocrinology | Admitting: Endocrinology

## 2018-10-22 DIAGNOSIS — E041 Nontoxic single thyroid nodule: Secondary | ICD-10-CM

## 2018-11-11 ENCOUNTER — Ambulatory Visit (INDEPENDENT_AMBULATORY_CARE_PROVIDER_SITE_OTHER): Payer: BC Managed Care – PPO | Admitting: Family Medicine

## 2018-11-11 ENCOUNTER — Other Ambulatory Visit: Payer: Self-pay

## 2018-11-11 DIAGNOSIS — E041 Nontoxic single thyroid nodule: Secondary | ICD-10-CM | POA: Diagnosis not present

## 2018-11-11 DIAGNOSIS — J4520 Mild intermittent asthma, uncomplicated: Secondary | ICD-10-CM | POA: Diagnosis not present

## 2018-11-11 MED ORDER — FLUTICASONE PROPIONATE HFA 110 MCG/ACT IN AERO: INHALATION_SPRAY | RESPIRATORY_TRACT | 3 refills | Status: DC

## 2018-11-11 NOTE — Progress Notes (Signed)
Telephone visit  Subjective: CC: Follow-up thyroid nodule PCP: Makayla Norlander, DO [name] is a 53 y.o. female calls for telephone consult today. Patient provides verbal consent for consult held via phone.  Location of patient: home Location of provider: [facility] Others present for call: none  1.  Thyroid nodule Patient reports that she actually saw Dr. Loanne Jackson with endocrinology for follow-up on her thyroid nodule.  She was told that her thyroid level may warrant radioactive iodine ablation.  She had the ultrasound done but has not been contacted with results or treatment plan yet.  Denies any change in symptoms.  She continues to have some neck pain that seems relieved by topical therapies and stretching.  She has been performing at home physical therapy exercises.  She discontinued use of Flexeril secondary to excessive sedation.  2.  Allergy induced asthma Patient with known asthma.  She uses Flovent 2 puffs once daily as needed.  She is been using this more regularly since onset of pollen.  She also uses Zyrtec.  No signs or symptoms of exacerbation at this time.  Denies shortness of breath or wheeze.   ROS: Per HPI  Allergies  Allergen Reactions  . Other Anaphylaxis and Hives    Bee stings  . Shellfish Allergy Swelling  . Penicillins Hives  . Sulfa Antibiotics Hives  . Prednisone     Pt stated, "It made me feel slightly agitated. I could not sleep well"   Past Medical History:  Diagnosis Date  . Arthritis   . Asthma   . Bowel obstruction ([place]) 08/2016  . Diverticulitis   . Miscarriage   . Multiple thyroid nodules   . Osteopenia   . Parathyroid tumor   . Pneumonia   . SBO (small bowel obstruction) ([place])   . Thyroid disease     Current Outpatient Medications:  .  acetaminophen (TYLENOL) 325 MG tablet, Take 650 mg by mouth every 6 (six) hours., Disp: , Rfl:  .  ALPRAZolam (XANAX) 0.25 MG tablet, TAKE 0.5 to 1 TABLET(0.25 MG) BY MOUTH qhs AS NEEDED FOR SLEEP  OR ANXIETY, Disp: 30 tablet, Rfl: 5 .  Azelastine-Fluticasone 137-50 MCG/ACT SUSP, 1 spray by Nasal route daily. In each nostril, Disp: , Rfl:  .  cetirizine (ZYRTEC) 10 MG tablet, Take 10 mg by mouth daily., Disp: , Rfl:  .  fluticasone (FLOVENT HFA) 110 MCG/ACT inhaler, Inhale 2 puffs into the lungs 2 (two) times daily., Disp: 1 Inhaler, Rfl: 3 .  SUMAtriptan (IMITREX) 50 MG tablet, Take 1 tablet (50 mg total) by mouth daily as needed for Migraine (may repeat in 2 hours)., Disp: 10 tablet, Rfl: 3  Assessment/ Plan: 53 y.o. female   1. Thyroid nodule I reviewed the ultrasound results with the patient.  I will also reach out to Dr. Loanne Jackson to see what his plan of treatment is.  Patient to contact me as well if she is able to get this information prior to our next visit.  2. Mild intermittent reactive airway disease without complication Stable.  Refills of Flovent have been sent to the pharmacy.  Follow-up PRN - fluticasone (FLOVENT HFA) 110 MCG/ACT inhaler; Inhale 2 puffs into the lungs 2 (two) times daily.  Dispense: 1 Inhaler; Refill: 3  Start time: 3:13pm End time: 3:28pm  Total time spent on patient care (including telephone call/ virtual visit): 17 minutes  Makayla Jackson, [place] [phone]

## 2018-12-31 ENCOUNTER — Ambulatory Visit: Payer: BC Managed Care – PPO | Admitting: Podiatry

## 2018-12-31 ENCOUNTER — Encounter: Payer: Self-pay | Admitting: Podiatry

## 2018-12-31 ENCOUNTER — Other Ambulatory Visit: Payer: Self-pay

## 2018-12-31 VITALS — Temp 97.3°F

## 2018-12-31 DIAGNOSIS — M722 Plantar fascial fibromatosis: Secondary | ICD-10-CM | POA: Diagnosis not present

## 2018-12-31 MED ORDER — TRIAMCINOLONE ACETONIDE 10 MG/ML IJ SUSP
10.0000 mg | Freq: Once | INTRAMUSCULAR | Status: AC
  Administered 2018-12-31: 10 mg

## 2019-01-01 NOTE — Progress Notes (Signed)
Subjective:   Patient ID: Makayla Jackson, female   DOB: 53 y.o.   MRN: [id number]   HPI Patient states her left heel has absolutely been killing her and stated she had about a months relief after last injection and it is been over a year that this is been doing this   ROS      Objective:  Physical Exam  Neurovascular status intact with exquisite discomfort left plantar fascial with moderate discomfort on the right plantar fascia also noted.  There is swelling of the insertion of the tendon into the calcaneus with a thickened tight band noted     Assessment:  Acute plantar fasciitis left with inflammation fluid buildup     Plan:  H&P conditions reviewed at great length.  At this point for the left I went ahead and I did discuss the possibility for surgical intervention or shockwave therapy but will get a try an aggressive conservative approach first.  I went ahead and I did sterile prep and reinjected the fascia and then applied air fracture walker to completely immobilize it.  She will utilize this full-time for 3 weeks be seen back in 1 month and we will see the results of complete immobilization and decide if more aggressive treatment plan will be necessary

## 2019-01-16 ENCOUNTER — Other Ambulatory Visit: Payer: Self-pay

## 2019-01-16 ENCOUNTER — Encounter: Payer: Self-pay | Admitting: Family Medicine

## 2019-01-16 ENCOUNTER — Ambulatory Visit (INDEPENDENT_AMBULATORY_CARE_PROVIDER_SITE_OTHER): Payer: BC Managed Care – PPO | Admitting: Family Medicine

## 2019-01-16 DIAGNOSIS — R519 Headache, unspecified: Secondary | ICD-10-CM

## 2019-01-16 DIAGNOSIS — R51 Headache: Secondary | ICD-10-CM | POA: Diagnosis not present

## 2019-01-16 DIAGNOSIS — W57XXXA Bitten or stung by nonvenomous insect and other nonvenomous arthropods, initial encounter: Secondary | ICD-10-CM | POA: Diagnosis not present

## 2019-01-16 DIAGNOSIS — R21 Rash and other nonspecific skin eruption: Secondary | ICD-10-CM | POA: Diagnosis not present

## 2019-01-16 MED ORDER — DOXYCYCLINE HYCLATE 100 MG PO TABS: 100.0000 mg | ORAL_TABLET | Freq: Two times a day (BID) | ORAL | 0 refills | Status: AC

## 2019-01-16 MED ORDER — FLUCONAZOLE 150 MG PO TABS: 150.0000 mg | ORAL_TABLET | Freq: Once | ORAL | 0 refills | Status: AC

## 2019-01-16 NOTE — Progress Notes (Signed)
Telephone visit  Subjective: CC: tick bite PCP: Janora Norlander, DO GHW:EXHBZ B Makayla Jackson is a 53 y.o. female calls for telephone consult today. Patient provides verbal consent for consult held via phone.  Location of patient: home Location of provider: [facility] Others present for call: none  1. Tick bite Patient reports that she had a deer tick on her and removed it 4 days ago.  She reports that the area she was bitten and she has an enlarging red knot.  She reports associated headache x2 days. She denies fatigue, myalgia or arthralgia. No fever, vomiting.    ROS: Per HPI  Allergies  Allergen Reactions  . Other Anaphylaxis and Hives    Bee stings  . Shellfish Allergy Swelling  . Penicillins Hives  . Sulfa Antibiotics Hives  . Prednisone     Pt stated, "It made me feel slightly agitated. I could not sleep well"   Past Medical History:  Diagnosis Date  . Arthritis   . Asthma   . Bowel obstruction ([place]) 08/2016  . Diverticulitis   . Miscarriage   . Multiple thyroid nodules   . Osteopenia   . Parathyroid tumor   . Pneumonia   . SBO (small bowel obstruction) ([place])   . Thyroid disease     Current Outpatient Medications:  .  acetaminophen (TYLENOL) 325 MG tablet, Take 650 mg by mouth every 6 (six) hours., Disp: , Rfl:  .  ALPRAZolam (XANAX) 0.25 MG tablet, TAKE 0.5 to 1 TABLET(0.25 MG) BY MOUTH qhs AS NEEDED FOR SLEEP OR ANXIETY, Disp: 30 tablet, Rfl: 5 .  Azelastine-Fluticasone 137-50 MCG/ACT SUSP, 1 spray by Nasal route daily. In each nostril, Disp: , Rfl:  .  cetirizine (ZYRTEC) 10 MG tablet, Take 10 mg by mouth daily., Disp: , Rfl:  .  cyclobenzaprine (FLEXERIL) 5 MG tablet, , Disp: , Rfl:  .  fluticasone (FLOVENT HFA) 110 MCG/ACT inhaler, Inhale 2 puffs into the lungs 2 (two) times daily., Disp: 1 Inhaler, Rfl: 3 .  SUMAtriptan (IMITREX) 50 MG tablet, Take 1 tablet (50 mg total) by mouth daily as needed for Migraine (may repeat in 2 hours)., Disp: 10 tablet, Rfl:  3  Assessment/ Plan: 53 y.o. female   1. Tick bite, initial encounter Given associated rash and headache will empirically treat for Lyme disease with doxycycline p.o. twice daily for 14 days.  I did instruct her that if she is to have tick bites in the future to contact me and we can do the prophylactic dose of doxycycline rather than completing a two-week dose.  I instructed her to look for worsening signs and symptoms or if she has in complete resolution of symptoms may need to prolong doxycycline course.  She voices good understanding will follow-up PRN - doxycycline (VIBRA-TABS) 100 MG tablet; Take 1 tablet (100 mg total) by mouth 2 (two) times daily for 14 days.  Dispense: 28 tablet; Refill: 0  2. Rash and nonspecific skin eruption - doxycycline (VIBRA-TABS) 100 MG tablet; Take 1 tablet (100 mg total) by mouth 2 (two) times daily for 14 days.  Dispense: 28 tablet; Refill: 0  3. Acute nonintractable headache, unspecified headache type - doxycycline (VIBRA-TABS) 100 MG tablet; Take 1 tablet (100 mg total) by mouth 2 (two) times daily for 14 days.  Dispense: 28 tablet; Refill: 0   Start time: 10:04am End time: 10:11am  Total time spent on patient care (including telephone call/ virtual visit): 15 minutes  [place], [facility] [phone]

## 2019-02-04 ENCOUNTER — Encounter: Payer: Self-pay | Admitting: Podiatry

## 2019-02-04 ENCOUNTER — Other Ambulatory Visit: Payer: Self-pay

## 2019-02-04 ENCOUNTER — Ambulatory Visit: Payer: BC Managed Care – PPO | Admitting: Podiatry

## 2019-02-04 VITALS — Temp 97.8°F

## 2019-02-04 DIAGNOSIS — M722 Plantar fascial fibromatosis: Secondary | ICD-10-CM

## 2019-02-04 NOTE — Progress Notes (Signed)
Subjective:   Patient ID: Makayla Jackson, female   DOB: 53 y.o.   MRN: [id number]   HPI Patient states my left heel is feeling quite a bit better but my right heel is very sore and I did go hiking and seemed irritated   ROS      Objective:  Physical Exam  Neurovascular status intact with good healing so far the left plantar fascia with no pain upon palpation with exquisite discomfort right fascia     Assessment:  Doing well post medication for the left heel with exquisite acute plantar fasciitis right     Plan:  H&P condition reviewed and at this point I have recommended treatment for the right and I did sterile prep and I injected the right plantar fascia 3 mg Kenalog 5 mg Xylocaine and instructed on physical therapy anti-inflammatories.  Reappoint to recheck in several weeks

## 2019-04-06 ENCOUNTER — Other Ambulatory Visit: Payer: Self-pay

## 2019-04-07 ENCOUNTER — Encounter: Payer: Self-pay | Admitting: Family Medicine

## 2019-04-07 ENCOUNTER — Ambulatory Visit: Payer: BC Managed Care – PPO | Admitting: Family Medicine

## 2019-04-07 VITALS — BP 130/88 | HR 67 | Temp 97.7°F | Ht 63.0 in | Wt 157.0 lb

## 2019-04-07 DIAGNOSIS — E0590 Thyrotoxicosis, unspecified without thyrotoxic crisis or storm: Secondary | ICD-10-CM

## 2019-04-07 DIAGNOSIS — E041 Nontoxic single thyroid nodule: Secondary | ICD-10-CM | POA: Diagnosis not present

## 2019-04-07 DIAGNOSIS — F419 Anxiety disorder, unspecified: Secondary | ICD-10-CM | POA: Diagnosis not present

## 2019-04-07 DIAGNOSIS — R002 Palpitations: Secondary | ICD-10-CM | POA: Diagnosis not present

## 2019-04-07 DIAGNOSIS — Z8249 Family history of ischemic heart disease and other diseases of the circulatory system: Secondary | ICD-10-CM

## 2019-04-07 MED ORDER — FLUOXETINE HCL 20 MG PO TABS: 20.0000 mg | ORAL_TABLET | Freq: Every day | ORAL | 1 refills | Status: DC

## 2019-04-07 MED ORDER — ALPRAZOLAM 0.25 MG PO TABS: ORAL_TABLET | ORAL | 1 refills | Status: DC

## 2019-04-07 NOTE — Progress Notes (Signed)
Subjective: [id number]  PCP: Janora Norlander, DO TOI:ZTIWP Makayla Jackson is a 53 y.o. female presenting to clinic today for:  1.  Heart palpitations Onset on Sunday.  She notes being woken up from sleep around 3 AM with her heart fluttering in her chest.  She states that this happened multiple times throughout Sunday but seem to calm down towards the end of the day after she took a Xanax.  She again had these intermittently through Monday and has had 2 episodes today.  She has panic/ anxiety disorder and is treated with PRN xanax by her previous PCP.  She notes increased stress (at home and at work) and has been using this more.  MedHx significant for hyperthyroidism/ thyroid nodule.  She is followed by Dr Loanne Drilling for this and had an ultrasound done this past Spring. FamHx significant for afib in her parents, 1 of which had a stroke as result of atrial fibrillation and the other an MI.   ROS: Per HPI  Allergies  Allergen Reactions  . Other Anaphylaxis and Hives    Bee stings  . Shellfish Allergy Swelling  . Penicillins Hives  . Sulfa Antibiotics Hives  . Prednisone     Pt stated, "It made me feel slightly agitated. I could not sleep well"   Past Medical History:  Diagnosis Date  . Arthritis   . Asthma   . Bowel obstruction ([place]) 08/2016  . Diverticulitis   . Miscarriage   . Multiple thyroid nodules   . Osteopenia   . Parathyroid tumor   . Pneumonia   . SBO (small bowel obstruction) ([place])   . Thyroid disease     Current Outpatient Medications:  .  acetaminophen (TYLENOL) 325 MG tablet, Take 650 mg by mouth every 6 (six) hours., Disp: , Rfl:  .  ALPRAZolam (XANAX) 0.25 MG tablet, TAKE 0.5 to 1 TABLET(0.25 MG) BY MOUTH qhs AS NEEDED FOR SLEEP OR ANXIETY, Disp: 30 tablet, Rfl: 5 .  cetirizine (ZYRTEC) 10 MG tablet, Take 10 mg by mouth daily., Disp: , Rfl:  .  fluticasone (FLOVENT HFA) 110 MCG/ACT inhaler, Inhale 2 puffs into the lungs 2 (two) times daily., Disp: 1 Inhaler,  Rfl: 3 .  SUMAtriptan (IMITREX) 50 MG tablet, Take 1 tablet (50 mg total) by mouth daily as needed for Migraine (may repeat in 2 hours)., Disp: 10 tablet, Rfl: 3 .  Azelastine-Fluticasone 137-50 MCG/ACT SUSP, 1 spray by Nasal route daily. In each nostril, Disp: , Rfl:  Social History   Socioeconomic History  . Marital status: Married    Spouse name: Not on file  . Number of children: 3  . Years of education: Not on file  . Highest education level: Not on file  Occupational History  . Occupation: Product manager: [employer]  Social Needs  . Financial resource strain: Not on file  . Food insecurity    Worry: Not on file    Inability: Not on file  . Transportation needs    Medical: Not on file    Non-medical: Not on file  Tobacco Use  . Smoking status: Never Smoker  . Smokeless tobacco: Never Used  Substance and Sexual Activity  . Alcohol use: Yes    Alcohol/week: 1.0 standard drinks    Types: 1 Glasses of wine per week    Comment: 2 glasses of wine a day  . Drug use: No  . Sexual activity: Not on file  Lifestyle  . Physical activity  Days per week: Not on file    Minutes per session: Not on file  . Stress: Not on file  Relationships  . Social Herbalist on phone: Not on file    Gets together: Not on file    Attends religious service: Not on file    Active member of club or organization: Not on file    Attends meetings of clubs or organizations: Not on file    Relationship status: Not on file  . Intimate partner violence    Fear of current or ex partner: Not on file    Emotionally abused: Not on file    Physically abused: Not on file    Forced sexual activity: Not on file  Other Topics Concern  . Not on file  Social History Narrative   The patient is married.  Her husband is retiring as Occupational hygienist of [employer] after serving in [place].   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[place] 2 glasses  of wine daily, no tobacco no drug use   Family History  Problem Relation Age of Onset  . Arthritis Mother   . Arthritis/Rheumatoid Mother   . Asthma Mother   . Heart disease Mother   . Hypertension Mother   . Stroke Mother   . Vision loss Mother   . Thyroid disease Mother   . Colon polyps Mother   . Thyroid cancer Mother   . Arthritis Father   . Heart disease Father   . Hypertension Father   . Colon polyps Father   . Diverticulitis Father        with colon surgery  . Arthritis/Rheumatoid Maternal Aunt   . Arthritis/Rheumatoid Maternal Uncle   . Diabetes Maternal Grandmother   . Lung cancer Maternal Grandfather   . Cancer Paternal Grandfather        type unknown  . Colon cancer Cousin   . Thyroid cancer Cousin   . Breast cancer Cousin     Objective: Office vital signs reviewed. BP 130/88   Pulse 67   Temp 97.7 F (36.5 C) (Oral)   Ht '5\' 3"'  (1.6 m)   Wt 157 lb (71.2 kg)   BMI 27.81 kg/m   Physical Examination:  General: Awake, alert, well nourished, No acute distress HEENT: Normal; no goiter or exophthalmos Cardio: regular rate and rhythm, S1S2 heard, no murmurs appreciated Pulm: clear to auscultation bilaterally, no wheezes, rhonchi or rales; normal work of breathing on room air Extremities: warm, well perfused, No edema, cyanosis or clubbing; +2 pulses bilaterally Psych: Mood labile, patient tearful.  Affect appropriate.  Speech normal. Depression screen [facility] 2/9 04/07/2019 09/30/2018 08/11/2018  Decreased Interest 0 0 0  Down, Depressed, Hopeless 1 0 0  PHQ - 2 Score 1 0 0  Altered sleeping 2 - -  Tired, decreased energy 1 - -  Change in appetite 0 - -  Feeling bad or failure about yourself  0 - -  Trouble concentrating 0 - -  Moving slowly or fidgety/restless 0 - -  Suicidal thoughts 0 - -  PHQ-9 Score 4 - -  Difficult doing work/chores - - -   GAD 7 : Generalized Anxiety Score 04/07/2019  Nervous, Anxious, on Edge 3  Control/stop worrying 2  Worry too much  - different things 2  Trouble relaxing 2  Restless 2  Easily annoyed or irritable 1  Afraid - awful might happen 0  Total GAD 7 Score 12  Anxiety Difficulty Somewhat difficult   Assessment/ Plan: 53 y.o. female   1. Anxiety Not controlled.  We discussed starting a controller medicine and have prescribed her Prozac 20 mg daily.  I have also renewed her Xanax to use sparingly.  Controlled substance contract was reviewed and signed today.  UDS also ordered.  I would like to see her back in 6 weeks for interval follow-up.  2. Hyperthyroidism Check thyroid level.  May need to be seen by endocrinology again sooner.  I consider propranolol assuming that this is related to thyroid level but she has borderline bradycardia and I worry about exacerbating this.  I will place referral to cardiology for consideration of Holter monitor and further evaluation.  Today's EKG did not demonstrate any significant arrhythmias - EKG 12-Lead - Ambulatory referral to Cardiology - Thyroid Panel With TSH  3. Thyroid nodule  4. Heart palpitations - EKG 12-Lead - Ambulatory referral to Cardiology - CMP14+EGFR - Magnesium - Thyroid Panel With TSH - CBC  5. Family history of atrial fibrillation - Ambulatory referral to Cardiology   Orders Placed This Encounter  Procedures  . CMP14+EGFR  . Magnesium  . Thyroid Panel With TSH  . CBC  . ToxASSURE Select 13 (MW), Urine  . Ambulatory referral to Cardiology    Referral Priority:   Routine    Referral Type:   Consultation    Referral Reason:   Specialty Services Required    Requested Specialty:   Cardiology    Number of Visits Requested:   1  . EKG 12-Lead   Meds ordered this encounter  Medications  . ALPRAZolam (XANAX) 0.25 MG tablet    Sig: TAKE 0.5 to 1 TABLET(0.25 MG) BY MOUTH qhs AS NEEDED FOR SLEEP OR ANXIETY    Dispense:  30 tablet    Refill:  1  . FLUoxetine (PROZAC) 20 MG tablet    Sig: Take 1 tablet (20 mg total) by mouth daily.     Dispense:  30 tablet    Refill:  [place], DO [place] [phone]

## 2019-04-07 NOTE — Patient Instructions (Addendum)
You had labs performed today.  You will be contacted with the results of the labs once they are available, usually in the next 3 business days for routine lab work.  If you have an active my chart account, they will be released to your MyChart.  If you prefer to have these labs released to you via telephone, please let us know.  Referral has been placed to Cardiology for further evaluation of your palpitations.  Taking the medicine as directed and not missing any doses is one of the best things you can do to treat your anxiety/depression.  Here are some things to keep in mind:  1) Side effects (stomach upset, some increased anxiety) may happen before you notice a benefit.  These side effects typically go away over time. 2) Changes to your dose of medicine or a change in medication all together is sometimes necessary 3) Most people need to be on medication at least 12 months 4) Many people will notice an improvement within two weeks but the full effect of the medication can take up to 4-6 weeks 5) Stopping the medication when you start feeling better often results in a return of symptoms 6) Never discontinue your medication without contacting a health care professional first.  Some medications require gradual discontinuation/ taper and can make you sick if you stop them abruptly.  If your symptoms worsen or you have thoughts of suicide/homicide, PLEASE SEEK IMMEDIATE MEDICAL ATTENTION.  You may always call:  National Suicide Hotline: (854) 307-0129 [place]: 819 195 5768 Crisis Recovery in [place]: 580-309-5960   These are available 24 hours a day, 7 days a week.    Palpitations Palpitations are feelings that your heartbeat is not normal. Your heartbeat may feel like it is:  Uneven.  Faster than normal.  Fluttering.  Skipping a beat. This is usually not a serious problem. In some cases, you may need tests to rule out any serious problems. Follow these  instructions at home: Pay attention to any changes in your condition. Take these actions to help manage your symptoms: Eating and drinking  Avoid: ? Coffee, tea, soft drinks, and energy drinks. ? Chocolate. ? Alcohol. ? Diet pills. Lifestyle   Try to lower your stress. These things can help you relax: ? Yoga. ? Deep breathing and meditation. ? Exercise. ? Using words and images to create positive thoughts (guided imagery). ? Using your mind to control things in your body (biofeedback).  Do not use drugs.  Get plenty of rest and sleep. Keep a regular bed time. General instructions   Take over-the-counter and prescription medicines only as told by your doctor.  Do not use any products that contain nicotine or tobacco, such as cigarettes and e-cigarettes. If you need help quitting, ask your doctor.  Keep all follow-up visits as told by your doctor. This is important. You may need more tests if palpitations do not go away or get worse. Contact a doctor if:  Your symptoms last more than 24 hours.  Your symptoms occur more often. Get help right away if you:  Have chest pain.  Feel short of breath.  Have a very bad headache.  Feel dizzy.  Pass out (faint). Summary  Palpitations are feelings that your heartbeat is uneven or faster than normal. It may feel like your heart is fluttering or skipping a beat.  Avoid food and drinks that may cause palpitations. These include caffeine, chocolate, and alcohol.  Try to lower your stress. Do not smoke  or use drugs.  Get help right away if you faint or have chest pain, shortness of breath, a severe headache, or dizziness. This information is not intended to replace advice given to you by your health care provider. Make sure you discuss any questions you have with your health care provider. Document Released: 05/01/2008 Document Revised: 09/04/2017 Document Reviewed: 09/04/2017 Elsevier Patient Education  2020 [place].    Controlled Substance Guidelines:  1. You cannot get an early refill, even it is lost.  2. You cannot get controlled medications from any other doctor, unless it is the emergency department and related to a new problem or injury.  3. You cannot use alcohol, marijuana, cocaine or any other recreational drugs while using this medication. This is very dangerous.  4. You are willing to have your urine drug tested at each visit.  5. You will not drive while using this medication, because that can put yourself and others in serious danger of an accident. 6. If any medication is stolen, then there must be a police report to verify it, or it cannot be refilled.  7. I will not prescribe these medications for longer than 3 months.  8. You must bring your pill bottle to each visit.  9. You must use the same pharmacy for all refills for the medication, unless you clear it with me beforehand.  10. You cannot share or sell this medication.

## 2019-04-08 LAB — THYROID PANEL WITH TSH
Free Thyroxine Index: 2 (ref 1.2–4.9)
T3 Uptake Ratio: 29 % (ref 24–39)
T4, Total: 6.8 ug/dL (ref 4.5–12.0)
TSH: 0.291 u[IU]/mL — ABNORMAL LOW (ref 0.450–4.500)

## 2019-04-08 LAB — CBC
Hematocrit: 45.3 % (ref 34.0–46.6)
Hemoglobin: 14.9 g/dL (ref 11.1–15.9)
MCH: 28.8 pg (ref 26.6–33.0)
MCHC: 32.9 g/dL (ref 31.5–35.7)
MCV: 88 fL (ref 79–97)
Platelets: 276 10*3/uL (ref 150–450)
RBC: 5.17 x10E6/uL (ref 3.77–5.28)
RDW: 12.1 % (ref 11.7–15.4)
WBC: 5.7 10*3/uL (ref 3.4–10.8)

## 2019-04-08 LAB — CMP14+EGFR
ALT: 15 IU/L (ref 0–32)
AST: 20 IU/L (ref 0–40)
Albumin/Globulin Ratio: 2 (ref 1.2–2.2)
Albumin: 4.8 g/dL (ref 3.8–4.9)
Alkaline Phosphatase: 64 IU/L (ref 39–117)
BUN/Creatinine Ratio: 16 (ref 9–23)
BUN: 13 mg/dL (ref 6–24)
Bilirubin Total: 0.2 mg/dL (ref 0.0–1.2)
CO2: 25 mmol/L (ref 20–29)
Calcium: 9.5 mg/dL (ref 8.7–10.2)
Chloride: 102 mmol/L (ref 96–106)
Creatinine, Ser: 0.81 mg/dL (ref 0.57–1.00)
GFR calc Af Amer: 97 mL/min/{1.73_m2} (ref >59)
GFR calc non Af Amer: 84 mL/min/{1.73_m2} (ref >59)
Globulin, Total: 2.4 g/dL (ref 1.5–4.5)
Glucose: 92 mg/dL (ref 65–99)
Potassium: 4.1 mmol/L (ref 3.5–5.2)
Sodium: 141 mmol/L (ref 134–144)
Total Protein: 7.2 g/dL (ref 6.0–8.5)

## 2019-04-08 LAB — MAGNESIUM: Magnesium: 1.9 mg/dL (ref 1.6–2.3)

## 2019-04-09 ENCOUNTER — Other Ambulatory Visit: Payer: Self-pay

## 2019-04-09 LAB — TOXASSURE SELECT 13 (MW), URINE

## 2019-04-10 ENCOUNTER — Ambulatory Visit: Payer: BC Managed Care – PPO | Admitting: Cardiology

## 2019-04-10 ENCOUNTER — Encounter: Payer: Self-pay | Admitting: Cardiology

## 2019-04-10 VITALS — BP 134/85 | HR 83 | Ht 64.5 in | Wt 156.0 lb

## 2019-04-10 DIAGNOSIS — R002 Palpitations: Secondary | ICD-10-CM | POA: Diagnosis not present

## 2019-04-10 NOTE — Patient Instructions (Signed)
Your physician recommends that you schedule a follow-up appointment in: PENDING WITH DR Ste[place] Genevieve  Your physician recommends that you continue on your current medications as directed. Please refer to the Current Medication list given to you today.  Your physician has recommended that you wear an event monitor FOR 14 DAYS  Event monitors are medical devices that record the heart's electrical activity. Doctors most often [place] these monitors to diagnose arrhythmias. Arrhythmias are problems with the speed or rhythm of the heartbeat. The monitor is a small, portable device. You can wear one while you do your normal daily activities. This is usually used to diagnose what is causing palpitations/syncope (passing out).  Thank you for choosing [facility]!!

## 2019-04-10 NOTE — Progress Notes (Signed)
Cardiology Office Note  Date: 04/10/2019   ID: Makayla, [name] [birth year] Jackson, MRN [id number]  PCP:  Janora Norlander, DO  Consulting Cardiologist: Satira Sark, MD Electrophysiologist:  None   Chief Complaint  Patient presents with  . Palpitations    History of Present Illness: Makayla Jackson is a 53 y.o. female referred for cardiology consultation by Dr. Lajuana Ripple for evaluation of palpitations.  We discussed her symptoms today.  She states that for the last few years she has experienced intermittent episodes of a rapid palpitation, usually no more than 1 or 2 seconds, sporadic without known precipitant, and generally infrequent.  She has had no associated chest pain or syncope.  Within the last few weeks she has had more frequent symptoms, had one that woke her up at 3 AM on Sunday morning, and a few more the next day.  During that time she states that she had been very fatigued and overworked, she has been under a lot of stress.  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  She has no documented history of cardiac arrhythmia.  She states that both parents have atrial fibrillation however.  She also has a multinodular goiter and follows with endocrinology, recent TSH was 0.291.  She uses Xanax intermittently for anxiety.  I reviewed her ECG done recently, overall nonspecific.  Past Medical History:  Diagnosis Date  . Arthritis   . Asthma   . Bowel obstruction ([place]) 08/2016  . Diverticulitis   . Miscarriage   . Multiple thyroid nodules   . Osteopenia   . Parathyroid tumor   . Pneumonia   . SBO (small bowel obstruction) ([place])   . Thyroid disease     Past Surgical History:  Procedure Laterality Date  . COLON SURGERY  2014   Diverticulosis resection, after diverticulitis  . FINE NEEDLE ASPIRATION     Thyroid Nodule  . KNEE ARTHROSCOPY Right   . NASAL SINUS SURGERY  1998  .  PARATHYROIDECTOMY  12/2014  . PARTIAL HYSTERECTOMY  2010  . TONSILLECTOMY      Current Outpatient Medications  Medication Sig Dispense Refill  . acetaminophen (TYLENOL) 325 MG tablet Take 650 mg by mouth every 6 (six) hours.    . ALPRAZolam (XANAX) 0.25 MG tablet TAKE 0.5 to 1 TABLET(0.25 MG) BY MOUTH qhs AS NEEDED FOR SLEEP OR ANXIETY 30 tablet 1  . Azelastine-Fluticasone 137-50 MCG/ACT SUSP 1 spray by Nasal route daily. In each nostril    . cetirizine (ZYRTEC) 10 MG tablet Take 10 mg by mouth daily.    Marland Kitchen FLUoxetine (PROZAC) 20 MG tablet Take 1 tablet (20 mg total) by mouth daily. 30 tablet 1  . fluticasone (FLOVENT HFA) 110 MCG/ACT inhaler Inhale 2 puffs into the lungs 2 (two) times daily. 1 Inhaler 3  . SUMAtriptan (IMITREX) 50 MG tablet Take 1 tablet (50 mg total) by mouth daily as needed for Migraine (may repeat in 2 hours). 10 tablet 3   No current facility-administered medications for this visit.    Allergies:  Other, Shellfish allergy, Penicillins, Sulfa antibiotics, and Prednisone   Social History: The patient  reports that she has never smoked. She has never used smokeless tobacco. She reports current alcohol use of about 1.0 standard drinks of alcohol per week. She reports that she does not use drugs.   Family History: The patient's family history includes Arthritis in her father  and mother; Arthritis/Rheumatoid in her maternal aunt, maternal uncle, and mother; Asthma in her mother; Breast cancer in her cousin; Cancer in her paternal grandfather; Colon cancer in her cousin; Colon polyps in her father and mother; Diabetes in her maternal grandmother; Diverticulitis in her father; Heart disease in her father and mother; Hypertension in her father and mother; Lung cancer in her maternal grandfather; Stroke in her mother; Thyroid cancer in her cousin and mother; Thyroid disease in her mother; Vision loss in her mother.   ROS:  Please see the history of present illness. Otherwise,  complete review of systems is positive for none.  All other systems are reviewed and negative.   Physical Exam: VS:  BP 134/85 (BP Location: Left Arm)   Pulse 83   Ht 5' 4.5" (1.638 m)   Wt 156 lb (70.8 kg)   SpO2 97%   BMI 26.36 kg/m , BMI Body mass index is 26.36 kg/m.  Wt Readings from Last 3 Encounters:  04/10/19 156 lb (70.8 kg)  04/07/19 157 lb (71.2 kg)  10/08/18 156 lb 6.4 oz (70.9 kg)    General: Patient appears comfortable at rest. HEENT: Conjunctiva and lids normal, wearing a mask. Neck: Supple, no elevated JVP or carotid bruits, no thyromegaly. Lungs: Clear to auscultation, nonlabored breathing at rest. Cardiac: Regular rate and rhythm, no S3 or significant systolic murmur, no pericardial rub. Abdomen: Soft, nontender, bowel sounds present. Extremities: No pitting edema, distal pulses 2+. Skin: Warm and dry. Musculoskeletal: No kyphosis. Neuropsychiatric: Alert and oriented x3, affect grossly appropriate.  ECG:  An ECG dated 04/07/2019 was personally reviewed today and demonstrated:  Sinus rhythm with low voltage and poor R wave progression.  Recent Labwork: 04/07/2019: ALT 15; AST 20; BUN 13; Creatinine, Ser 0.81; Hemoglobin 14.9; Magnesium 1.9; Platelets 276; Potassium 4.1; Sodium 141; TSH 0.291   Other Studies Reviewed Today:  No prior cardiac testing.  Assessment and Plan:  Intermittent palpitations as described above.  Baseline ECG shows sinus rhythm.  Both parents have atrial fibrillation.  She does have a multinodular goiter with follow-up by endocrinology and intermittent anxiety for which she uses Xanax.  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  Plan is to obtain a 14-day ZIO patch to investigate any potential arrhythmias, mainly to exclude paroxysmal atrial fibrillation in light of her parents' history.  She may be having intermittent ectopy that she is experiencing as  palpitations.   Medication Adjustments/Labs and Tests Ordered: Current medicines are reviewed at length with the patient today.  Concerns regarding medicines are outlined above.   Tests Ordered: Orders Placed This Encounter  Procedures  . LONG TERM MONITOR-LIVE TELEMETRY (3-14 DAYS)    Medication Changes: No orders of the defined types were placed in this encounter.   Disposition:  Follow up test results.  Signed, Satira Sark, MD, [facility] 04/10/2019 3:18 PM    [facility], [place], [place] [address] Phone: [phone]; Fax: [phone]

## 2019-04-14 ENCOUNTER — Ambulatory Visit (INDEPENDENT_AMBULATORY_CARE_PROVIDER_SITE_OTHER): Payer: BC Managed Care – PPO | Admitting: Endocrinology

## 2019-04-14 ENCOUNTER — Other Ambulatory Visit: Payer: Self-pay

## 2019-04-14 ENCOUNTER — Telehealth: Payer: Self-pay | Admitting: Cardiology

## 2019-04-14 ENCOUNTER — Encounter: Payer: Self-pay | Admitting: Endocrinology

## 2019-04-14 VITALS — BP 118/80 | HR 78 | Temp 97.5°F | Wt 158.2 lb

## 2019-04-14 DIAGNOSIS — E0590 Thyrotoxicosis, unspecified without thyrotoxic crisis or storm: Secondary | ICD-10-CM | POA: Diagnosis not present

## 2019-04-14 DIAGNOSIS — E042 Nontoxic multinodular goiter: Secondary | ICD-10-CM

## 2019-04-14 DIAGNOSIS — R002 Palpitations: Secondary | ICD-10-CM | POA: Diagnosis not present

## 2019-04-14 MED ORDER — METHIMAZOLE 5 MG PO TABS: 2.5000 mg | ORAL_TABLET | Freq: Every day | ORAL | 2 refills | Status: DC

## 2019-04-14 NOTE — Progress Notes (Signed)
Subjective:    Patient ID: Makayla Jackson, female    DOB: 1965/09/15, 53 y.o.   MRN: [id number]  HPI Pt returns for f/u of MNG (dx'ed 2013; TSH is slightly low; she has never been on therapy for this, but she has had multiple bxs and cyst drainage procedures; she had parathyroidectomy in 2017; last [place] in 2020 showed no change, and no f/u was advised).  She has slight palpitations in the chest, and assoc swelling at the ant neck.   Past Medical History:  Diagnosis Date  . Arthritis   . Asthma   . Bowel obstruction ([place]) 08/2016  . Diverticulitis   . Miscarriage   . Multiple thyroid nodules   . Osteopenia   . Parathyroid tumor   . Pneumonia   . SBO (small bowel obstruction) ([place])   . Thyroid disease     Past Surgical History:  Procedure Laterality Date  . COLON SURGERY  2014   Diverticulosis resection, after diverticulitis  . FINE NEEDLE ASPIRATION     Thyroid Nodule  . KNEE ARTHROSCOPY Right   . NASAL SINUS SURGERY  1998  . PARATHYROIDECTOMY  12/2014  . PARTIAL HYSTERECTOMY  2010  . TONSILLECTOMY      Social History   Socioeconomic History  . Marital status: Married    Spouse name: Not on file  . Number of children: 3  . Years of education: Not on file  . Highest education level: Not on file  Occupational History  . Occupation: Product manager: [employer]  Social Needs  . Financial resource strain: Not on file  . Food insecurity    Worry: Not on file    Inability: Not on file  . Transportation needs    Medical: Not on file    Non-medical: Not on file  Tobacco Use  . Smoking status: Never Smoker  . Smokeless tobacco: Never Used  Substance and Sexual Activity  . Alcohol use: Yes    Alcohol/week: 1.0 standard drinks    Types: 1 Glasses of wine per week    Comment: occasional glass of wine   . Drug use: No  . Sexual activity: Not on file  Lifestyle  . Physical activity    Days per week: Not on file    Minutes per session: Not on file   . Stress: Not on file  Relationships  . Social Herbalist on phone: Not on file    Gets together: Not on file    Attends religious service: Not on file    Active member of club or organization: Not on file    Attends meetings of clubs or organizations: Not on file    Relationship status: Not on file  . Intimate partner violence    Fear of current or ex partner: Not on file    Emotionally abused: Not on file    Physically abused: Not on file    Forced sexual activity: Not on file  Other Topics Concern  . Not on file  Social History Narrative   The patient is married.  Her husband is retiring as Occupational hygienist of [employer] after serving in [place].   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 acetaminophen (TYLENOL) 325 MG  tablet Take 650 mg by mouth every 6 (six) hours.    . ALPRAZolam (XANAX) 0.25 MG tablet TAKE 0.5 to 1 TABLET(0.25 MG) BY MOUTH qhs AS NEEDED FOR SLEEP OR ANXIETY 30 tablet 1  . Azelastine-Fluticasone 137-50 MCG/ACT SUSP 1 spray by Nasal route daily. In each nostril    . cetirizine (ZYRTEC) 10 MG tablet Take 10 mg by mouth daily.    . fluticasone (FLOVENT HFA) 110 MCG/ACT inhaler Inhale 2 puffs into the lungs 2 (two) times daily. 1 Inhaler 3  . SUMAtriptan (IMITREX) 50 MG tablet Take 1 tablet (50 mg total) by mouth daily as needed for Migraine (may repeat in 2 hours). 10 tablet 3  . FLUoxetine (PROZAC) 20 MG tablet Take 1 tablet (20 mg total) by mouth daily. (Patient not taking: Reported on 04/14/2019) 30 tablet 1   No current facility-administered medications on file prior to visit.     Allergies  Allergen Reactions  . Other Anaphylaxis and Hives    Bee stings  . Shellfish Allergy Swelling  . Penicillins Hives  . Sulfa Antibiotics Hives  . Prednisone     Pt stated, "It  made me feel slightly agitated. I could not sleep well"    Family History  Problem Relation Age of Onset  . Arthritis Mother   . Arthritis/Rheumatoid Mother   . Asthma Mother   . Heart disease Mother   . Hypertension Mother   . Stroke Mother   . Vision loss Mother   . Thyroid disease Mother   . Colon polyps Mother   . Thyroid cancer Mother   . Arthritis Father   . Heart disease Father   . Hypertension Father   . Colon polyps Father   . Diverticulitis Father        with colon surgery  . Arthritis/Rheumatoid Maternal Aunt   . Arthritis/Rheumatoid Maternal Uncle   . Diabetes Maternal Grandmother   . Lung cancer Maternal Grandfather   . Cancer Paternal Grandfather        type unknown  . Colon cancer Cousin   . Thyroid cancer Cousin   . Breast cancer Cousin     BP 118/80   Pulse 78   Temp (!) 97.5 F (36.4 C) (Temporal)   Wt 158 lb 3.2 oz (71.8 kg)   SpO2 99%   BMI 26.74 kg/m   Review of Systems Denies leg swelling and tremor    Objective:   Physical Exam VITAL SIGNS:  See vs page GENERAL: no distress Neck: a healed scar is present.  I can feel a nodule at the right thyroid bed, approx 2 cm   Lab Results  Component Value Date   TSH 0.291 (L) 04/07/2019   T4TOTAL 6.8 04/07/2019      Assessment & Plan:  MNG, partially palpable.  No further surveillance [place] are needed Hyperthyroidism, mild, due to the goiter.  Palpitations.  Uncertain if due to hyperthyroidism.  We discussed.  She wants a trial of thionamide rx--fine with me.    Patient Instructions  I have sent a prescription to your pharmacy, for a pill to slow the thyroid. If ever you have fever while taking methimazole, stop it and call us, even if the reason is obvious, because of the risk of a rare side-effect.    Please come back for a follow-up appointment in 1-2 months.

## 2019-04-14 NOTE — Patient Instructions (Addendum)
I have sent a prescription to your pharmacy, for a pill to slow the thyroid. If ever you have fever while taking methimazole, stop it and call us, even if the reason is obvious, because of the risk of a rare side-effect.    Please come back for a follow-up appointment in 1-2 months.

## 2019-04-14 NOTE — Telephone Encounter (Signed)
[employer] called patient's insurance declined approving for patient

## 2019-04-16 NOTE — Telephone Encounter (Signed)
Called pt. No answer. Left message for pt to return call.  

## 2019-04-23 NOTE — Telephone Encounter (Signed)
Indigent and underinsured number given to patient for [employer] [phone] opt 1. Patient says she will call this number to see if she qualifies for assistance to get [employer] monitor.

## 2019-05-12 ENCOUNTER — Other Ambulatory Visit: Payer: Self-pay

## 2019-05-14 ENCOUNTER — Other Ambulatory Visit: Payer: Self-pay

## 2019-05-14 ENCOUNTER — Encounter: Payer: Self-pay | Admitting: Endocrinology

## 2019-05-14 ENCOUNTER — Ambulatory Visit (INDEPENDENT_AMBULATORY_CARE_PROVIDER_SITE_OTHER): Payer: BC Managed Care – PPO | Admitting: Endocrinology

## 2019-05-14 DIAGNOSIS — M2550 Pain in unspecified joint: Secondary | ICD-10-CM | POA: Insufficient documentation

## 2019-05-14 DIAGNOSIS — E0590 Thyrotoxicosis, unspecified without thyrotoxic crisis or storm: Secondary | ICD-10-CM | POA: Diagnosis not present

## 2019-05-14 DIAGNOSIS — M25542 Pain in joints of left hand: Secondary | ICD-10-CM | POA: Diagnosis not present

## 2019-05-14 DIAGNOSIS — M25541 Pain in joints of right hand: Secondary | ICD-10-CM

## 2019-05-14 DIAGNOSIS — Z23 Encounter for immunization: Secondary | ICD-10-CM

## 2019-05-14 HISTORY — DX: Thyrotoxicosis, unspecified without thyrotoxic crisis or storm: E05.90

## 2019-05-14 NOTE — Progress Notes (Signed)
Subjective:    Patient ID: Makayla Jackson, female    DOB: 1965/12/27, 53 y.o.   MRN: [id number]  HPI Pt returns for f/u of MNG (dx'ed 2013; TSH is slightly low; she has had multiple bxs and cyst drainage procedures; she had parathyroidectomy in 2017; last [place] in 2020 showed no change, and no f/u US was advised; tapazole was rx'ed in 2020).   She has slight swelling of the joints of the hands (MCP's), but no assoc fever.   Past Medical History:  Diagnosis Date  . Arthritis   . Asthma   . Bowel obstruction ([place]) 08/2016  . Diverticulitis   . Miscarriage   . Multiple thyroid nodules   . Osteopenia   . Parathyroid tumor   . Pneumonia   . SBO (small bowel obstruction) ([place])   . Thyroid disease     Past Surgical History:  Procedure Laterality Date  . COLON SURGERY  2014   Diverticulosis resection, after diverticulitis  . FINE NEEDLE ASPIRATION     Thyroid Nodule  . KNEE ARTHROSCOPY Right   . NASAL SINUS SURGERY  1998  . PARATHYROIDECTOMY  12/2014  . PARTIAL HYSTERECTOMY  2010  . TONSILLECTOMY      Social History   Socioeconomic History  . Marital status: Married    Spouse name: Not on file  . Number of children: 3  . Years of education: Not on file  . Highest education level: Not on file  Occupational History  . Occupation: Product manager: [employer]  Social Needs  . Financial resource strain: Not on file  . Food insecurity    Worry: Not on file    Inability: Not on file  . Transportation needs    Medical: Not on file    Non-medical: Not on file  Tobacco Use  . Smoking status: Never Smoker  . Smokeless tobacco: Never Used  Substance and Sexual Activity  . Alcohol use: Yes    Alcohol/week: 1.0 standard drinks    Types: 1 Glasses of wine per week    Comment: occasional glass of wine   . Drug use: No  . Sexual activity: Not on file  Lifestyle  . Physical activity    Days per week: Not on file    Minutes per session: Not on file  . Stress:  Not on file  Relationships  . Social Herbalist on phone: Not on file    Gets together: Not on file    Attends religious service: Not on file    Active member of club or organization: Not on file    Attends meetings of clubs or organizations: Not on file    Relationship status: Not on file  . Intimate partner violence    Fear of current or ex partner: Not on file    Emotionally abused: Not on file    Physically abused: Not on file    Forced sexual activity: Not on file  Other Topics Concern  . Not on file  Social History Narrative   The patient is married.  Her husband is retiring as Occupational hygienist of [employer] after serving in [place].   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 acetaminophen (TYLENOL) 325 MG tablet  Take 650 mg by mouth every 6 (six) hours.    . ALPRAZolam (XANAX) 0.25 MG tablet TAKE 0.5 to 1 TABLET(0.25 MG) BY MOUTH qhs AS NEEDED FOR SLEEP OR ANXIETY 30 tablet 1  . Azelastine-Fluticasone 137-50 MCG/ACT SUSP 1 spray by Nasal route daily. In each nostril    . cetirizine (ZYRTEC) 10 MG tablet Take 10 mg by mouth daily.    Marland Kitchen FLUoxetine (PROZAC) 20 MG tablet Take 1 tablet (20 mg total) by mouth daily. 30 tablet 1  . fluticasone (FLOVENT HFA) 110 MCG/ACT inhaler Inhale 2 puffs into the lungs 2 (two) times daily. 1 Inhaler 3  . methimazole (TAPAZOLE) 5 MG tablet Take 0.5 tablets (2.5 mg total) by mouth daily. 15 tablet 2  . SUMAtriptan (IMITREX) 50 MG tablet Take 1 tablet (50 mg total) by mouth daily as needed for Migraine (may repeat in 2 hours). 10 tablet 3   No current facility-administered medications on file prior to visit.     Allergies  Allergen Reactions  . Other Anaphylaxis and Hives    Bee stings  . Shellfish Allergy Swelling  . Penicillins Hives  . Sulfa  Antibiotics Hives  . Prednisone     Pt stated, "It made me feel slightly agitated. I could not sleep well"    Family History  Problem Relation Age of Onset  . Arthritis Mother   . Arthritis/Rheumatoid Mother   . Asthma Mother   . Heart disease Mother   . Hypertension Mother   . Stroke Mother   . Vision loss Mother   . Thyroid disease Mother   . Colon polyps Mother   . Thyroid cancer Mother   . Arthritis Father   . Heart disease Father   . Hypertension Father   . Colon polyps Father   . Diverticulitis Father        with colon surgery  . Arthritis/Rheumatoid Maternal Aunt   . Arthritis/Rheumatoid Maternal Uncle   . Diabetes Maternal Grandmother   . Lung cancer Maternal Grandfather   . Cancer Paternal Grandfather        type unknown  . Colon cancer Cousin   . Thyroid cancer Cousin   . Breast cancer Cousin     BP 124/74 (BP Location: Left Arm, Patient Position: Sitting, Cuff Size: Normal)   Pulse 82   Ht 5' 4.5" (1.638 m)   Wt 157 lb 9.6 oz (71.5 kg)   SpO2 98%   BMI 26.63 kg/m    Review of Systems Since on tapazole, palpitations are less, but she has more headache and constipation.     Objective:   Physical Exam VITAL SIGNS:  See vs page GENERAL: no distress Neck: a healed scar is present.  I can feel slight fullness at the right thyroid area, but I can't tell details.   Right hand: slight swelling at the right thenar area  Lab Results  Component Value Date   PTH 28 10/01/2017   CALCIUM 9.5 04/07/2019   Lab Results  Component Value Date   TSH 0.47 05/14/2019   T4TOTAL 6.8 04/07/2019      Assessment & Plan:  Hyperthyroidism, well-controlled.   Arthralgias, new.   Patient Instructions  Blood tests are requested for you today.  We'll let you know about the results.  If ever you have fever while taking methimazole, stop it and call us, even if the reason is obvious, because of the risk of a rare side-effect.   Please come back for a follow-up  appointment in 3 months.

## 2019-05-14 NOTE — Patient Instructions (Addendum)
Blood tests are requested for you today.  We'll let you know about the results.  If ever you have fever while taking methimazole, stop it and call us, even if the reason is obvious, because of the risk of a rare side-effect. Please come back for a follow-up appointment in 3 months.   

## 2019-05-15 LAB — CBC WITH DIFFERENTIAL/PLATELET
Basophils Absolute: 0.1 10*3/uL (ref 0.0–0.1)
Basophils Relative: 1.2 % (ref 0.0–3.0)
Eosinophils Absolute: 0.1 10*3/uL (ref 0.0–0.7)
Eosinophils Relative: 2 % (ref 0.0–5.0)
HCT: 43.4 % (ref 36.0–46.0)
Hemoglobin: 14.5 g/dL (ref 12.0–15.0)
Lymphocytes Relative: 39.7 % (ref 12.0–46.0)
Lymphs Abs: 2 10*3/uL (ref 0.7–4.0)
MCHC: 33.3 g/dL (ref 30.0–36.0)
MCV: 89.9 fl (ref 78.0–100.0)
Monocytes Absolute: 0.6 10*3/uL (ref 0.1–1.0)
Monocytes Relative: 12.5 % — ABNORMAL HIGH (ref 3.0–12.0)
Neutro Abs: 2.3 10*3/uL (ref 1.4–7.7)
Neutrophils Relative %: 44.6 % (ref 43.0–77.0)
Platelets: 263 10*3/uL (ref 150.0–400.0)
RBC: 4.83 Mil/uL (ref 3.87–5.11)
RDW: 13.2 % (ref 11.5–15.5)
WBC: 5.1 10*3/uL (ref 4.0–10.5)

## 2019-05-15 LAB — T4, FREE: Free T4: 0.84 ng/dL (ref 0.60–1.60)

## 2019-05-15 LAB — SEDIMENTATION RATE: Sed Rate: 13 mm/hr (ref 0–30)

## 2019-05-15 LAB — TSH: TSH: 0.47 u[IU]/mL (ref 0.35–4.50)

## 2019-05-26 ENCOUNTER — Ambulatory Visit (INDEPENDENT_AMBULATORY_CARE_PROVIDER_SITE_OTHER): Payer: BC Managed Care – PPO | Admitting: Nurse Practitioner

## 2019-05-26 ENCOUNTER — Encounter: Payer: Self-pay | Admitting: Nurse Practitioner

## 2019-05-26 DIAGNOSIS — J040 Acute laryngitis: Secondary | ICD-10-CM

## 2019-05-26 DIAGNOSIS — J0101 Acute recurrent maxillary sinusitis: Secondary | ICD-10-CM | POA: Diagnosis not present

## 2019-05-26 MED ORDER — PREDNISONE 20 MG PO TABS: ORAL_TABLET | ORAL | 0 refills | Status: DC

## 2019-05-26 MED ORDER — CEFDINIR 300 MG PO CAPS: 300.0000 mg | ORAL_CAPSULE | Freq: Two times a day (BID) | ORAL | 0 refills | Status: DC

## 2019-05-26 NOTE — Progress Notes (Signed)
Virtual Visit via telephone Note Due to COVID-19 pandemic this visit was conducted virtually. This visit type was conducted due to national recommendations for restrictions regarding the COVID-19 Pandemic (e.g. social distancing, sheltering in place) in an effort to limit this patient's exposure and mitigate transmission in our community. All issues noted in this document were discussed and addressed.  A physical exam was not performed with this format.  I connected with Makayla Jackson on 05/26/19 at 1:25 by telephone and verified that I am speaking with the correct person using two identifiers. Makayla Jackson is currently located at home and no one is currently with her during visit. The provider, Mary-Margaret Hassell Done, FNP is located in their office at time of visit.  I discussed the limitations, risks, security and privacy concerns of performing an evaluation and management service by telephone and the availability of in person appointments. I also discussed with the patient that there may be a patient responsible charge related to this service. The patient expressed understanding and agreed to proceed.   History and Present Illness:   Chief Complaint: URI   HPI Patient calls in c/o sinus congestion, bil ear pain and can hardly talk. Started yesterday. She denies fever or chills   Review of Systems  Constitutional: Negative for chills and fever.  HENT: Positive for congestion, ear pain and sinus pain. Negative for sore throat.   Respiratory: Positive for cough (slight).   Cardiovascular: Negative.   Genitourinary: Negative.   Neurological: Negative for headaches.  Psychiatric/Behavioral: Negative.   All other systems reviewed and are negative.    Observations/Objective: Alert and oriented- answers all questions appropriately mild distress Voice very hoarse   Assessment and Plan: Terrill Mohr in today with chief complaint of URI   1. Acute recurrent maxillary sinusitis 2.  Laryngitis 1. Take meds as prescribed 2. Use a cool mist humidifier especially during the winter months and when heat has been humid. 3. Use saline nose sprays frequently 4. Saline irrigations of the nose can be very helpful if done frequently.  * 4X daily for 1 week*  * Use of a nettie pot can be helpful with this. Follow directions with this* 5. Drink plenty of fluids 6. Keep thermostat turn down low 7.For any cough or congestion  Use plain Mucinex- regular strength or max strength is fine   * Children- consult with Pharmacist for dosing 8. For fever or aces or pains- take tylenol or ibuprofen appropriate for age and weight.  * for fevers greater than 101 orally you may alternate ibuprofen and tylenol every  3 hours.   Meds ordered this encounter  Medications  . cefdinir (OMNICEF) 300 MG capsule    Sig: Take 1 capsule (300 mg total) by mouth 2 (two) times daily. 1 po BID    Dispense:  20 capsule    Refill:  0    Order Specific Question:   Supervising Provider    Answer:   Caryl Pina A [id number]  . predniSONE (DELTASONE) 20 MG tablet    Sig: 2 po at sametime daily for 5 days    Dispense:  10 tablet    Refill:  0    Order Specific Question:   Supervising Provider    Answer:   Caryl Pina A [id number]      Follow Up Instructions: prn    I discussed the assessment and treatment plan with the patient. The patient was provided an opportunity to ask questions and all were  answered. The patient agreed with the plan and demonstrated an understanding of the instructions.   The patient was advised to call back or seek an in-person evaluation if the symptoms worsen or if the condition fails to improve as anticipated.  The above assessment and management plan was discussed with the patient. The patient verbalized understanding of and has agreed to the management plan. Patient is aware to call the clinic if symptoms persist or worsen. Patient is aware when to return to the  clinic for a follow-up visit. Patient educated on when it is appropriate to go to the emergency department.   Time call ended:  1:39  I provided 14 minutes of non-face-to-face time during this encounter.    Mary-Margaret Hassell Done, FNP

## 2019-08-19 ENCOUNTER — Ambulatory Visit: Payer: BC Managed Care – PPO | Admitting: Endocrinology

## 2019-09-14 ENCOUNTER — Ambulatory Visit: Payer: BC Managed Care – PPO

## 2019-09-14 ENCOUNTER — Ambulatory Visit (INDEPENDENT_AMBULATORY_CARE_PROVIDER_SITE_OTHER): Payer: BC Managed Care – PPO | Admitting: Family Medicine

## 2019-09-14 VITALS — Temp 99.7°F

## 2019-09-14 DIAGNOSIS — Z7189 Other specified counseling: Secondary | ICD-10-CM | POA: Diagnosis not present

## 2019-09-14 DIAGNOSIS — J0110 Acute frontal sinusitis, unspecified: Secondary | ICD-10-CM

## 2019-09-14 MED ORDER — CEFDINIR 300 MG PO CAPS: 300.0000 mg | ORAL_CAPSULE | Freq: Two times a day (BID) | ORAL | 0 refills | Status: DC

## 2019-09-14 MED ORDER — PREDNISONE 20 MG PO TABS: ORAL_TABLET | ORAL | 0 refills | Status: DC

## 2019-09-14 NOTE — Progress Notes (Signed)
Telephone visit  Subjective: CC: flu like illness PCP: Janora Norlander, DO [id number] Makayla Jackson is a 54 y.o. female calls for telephone consult today. Patient provides verbal consent for consult held via phone.  Due to COVID-19 pandemic this visit was conducted virtually. This visit type was conducted due to national recommendations for restrictions regarding the COVID-19 Pandemic (e.g. social distancing, sheltering in place) in an effort to limit this patient's exposure and mitigate transmission in our community. All issues noted in this document were discussed and addressed.  A physical exam was not performed with this format.   Location of patient: home Location of provider: Working remotely from home Others present for call: husband  1. Flu like illness Developed a bad headache yesterday.  She has had a few day history of congestion and sinus pressure.  Yesterday she developed severe diarrhea ( >10 episodes).  She reports associated fever (onset yesterday. TMax 99.74F) and chills.  She has had myalgia of the spine.  She went to [facility] and did a rapid COVID test that was negative.  No nausea, vomiting.  She is tolerating fluids.   ROS: Per HPI  Allergies  Allergen Reactions  . Other Anaphylaxis and Hives    Bee stings  . Shellfish Allergy Swelling  . Penicillins Hives  . Sulfa Antibiotics Hives  . Prednisone     Pt stated, "It made me feel slightly agitated. I could not sleep well"   Past Medical History:  Diagnosis Date  . Arthritis   . Asthma   . Bowel obstruction ([place]) 08/2016  . Diverticulitis   . Miscarriage   . Multiple thyroid nodules   . Osteopenia   . Parathyroid tumor   . Pneumonia   . SBO (small bowel obstruction) ([place])   . Thyroid disease     Current Outpatient Medications:  .  acetaminophen (TYLENOL) 325 MG tablet, Take 650 mg by mouth every 6 (six) hours., Disp: , Rfl:  .  ALPRAZolam (XANAX) 0.25 MG tablet, TAKE 0.5 to 1 TABLET(0.25 MG) BY MOUTH qhs  AS NEEDED FOR SLEEP OR ANXIETY, Disp: 30 tablet, Rfl: 1 .  Azelastine-Fluticasone 137-50 MCG/ACT SUSP, 1 spray by Nasal route daily. In each nostril, Disp: , Rfl:  .  cetirizine (ZYRTEC) 10 MG tablet, Take 10 mg by mouth daily., Disp: , Rfl:  .  FLUoxetine (PROZAC) 20 MG tablet, Take 1 tablet (20 mg total) by mouth daily., Disp: 30 tablet, Rfl: 1 .  fluticasone (FLOVENT HFA) 110 MCG/ACT inhaler, Inhale 2 puffs into the lungs 2 (two) times daily., Disp: 1 Inhaler, Rfl: 3 .  methimazole (TAPAZOLE) 5 MG tablet, Take 0.5 tablets (2.5 mg total) by mouth daily., Disp: 15 tablet, Rfl: 2 .  SUMAtriptan (IMITREX) 50 MG tablet, Take 1 tablet (50 mg total) by mouth daily as needed for Migraine (may repeat in 2 hours)., Disp: 10 tablet, Rfl: 3  Assessment/ Plan: 54 y.o. female   1. Acute frontal sinusitis, recurrence not specified Possibly superimposed on a viral infection.  I have sent in empiric antibiotics and steroids to help with this.  Okay to continue OTC therapies. - cefdinir (OMNICEF) 300 MG capsule; Take 1 capsule (300 mg total) by mouth 2 (two) times daily. 1 po BID  Dispense: 20 capsule; Refill: 0 - predniSONE (DELTASONE) 20 MG tablet; 2 po at sametime daily for 5 days  Dispense: 10 tablet; Refill: 0  2. Advice given about COVID-19 virus by telephone I have recommended that she go ahead and  get rechecked for COVID-19 as I do question if she had a false negative on the rapid test.  We discussed consideration for eval at the urgent care to get flu swab, strep swab and Covid swab done at the same time.  Additionally, she would get an actual hands on evaluation.  We considered respiratory clinic but unfortunately they do not collect flu swabs there.  I have recommended that she continue isolation until she receives her Covid results.  Would also recommend that her husband isolate if possible until these results are available.  In the interim, push fluids, rest, Tylenol as needed.   Start time:  1:59pm End time: 2:21pm  Total time spent on patient care (including telephone call/ virtual visit): 30 minutes  [place], [place] [phone]

## 2019-09-14 NOTE — Patient Instructions (Signed)
Prevent the Spread of COVID-19 if You Are Sick If you are sick with COVID-19 or think you might have COVID-19, follow the steps below to care for yourself and to help protect other people in your home and community. Stay home except to get medical care.  Stay home. Most people with COVID-19 have mild illness and are able to recover at home without medical care. Do not leave your home, except to get medical care. Do not visit public areas.  Take care of yourself. Get rest and stay hydrated. Take over-the-counter medicines, such as acetaminophen, to help you feel better.  Stay in touch with your doctor. Call before you get medical care. Be sure to get care if you have trouble breathing, or have any other emergency warning signs, or if you think it is an emergency.  Avoid public transportation, ride-sharing, or taxis. Separate yourself from other people and pets in your home.  As much as possible, stay in a specific room and away from other people and pets in your home. Also, you should use a separate bathroom, if available. If you need to be around other people or animals in or outside of the home, wear a mask. ? See COVID-19 and Animals if you have questions about pets:www.cdc.gov/coronavirus/2019-ncov/faq.html#COVID19animals. ? Additional guidance is available for those living in close quarters. (https://www.cdc.gov/coronavirus/2019-ncov/daily-life-coping/living-in-close-quarters.html) and shared housing (https://www.cdc.gov/coronavirus/2019-ncov/daily-life-coping/shared-housing/index.html). Monitor your symptoms.  Symptoms of COVID-19 include fever, cough, and shortness of breath but other symptoms may be present as well.  Follow care instructions from your healthcare provider and local health department. Your local health authorities will give instructions on checking your symptoms and reporting information. When to Seek Emergency Medical Attention Look for emergency warning signs* for  COVID-19. If someone is showing any of these signs, seek emergency medical care immediately:  Trouble breathing  Persistent pain or pressure in the chest  New confusion  Bluish lips or face  Inability to wake or stay awake *This list is not all possible symptoms. Please call your medical provider for any other symptoms that are severe or concerning to you. Call 911 or call ahead to your local emergency facility: Notify the operator that you are seeking care for someone who has or may have COVID-19. Call ahead before visiting your doctor.  Call ahead. Many medical visits for routine care are being postponed or done by phone or telemedicine.  If you have a medical appointment that cannot be postponed, call your doctor's office, and tell them you have or may have COVID-19. If you are sick, wear a mask over your nose and mouth.  You should wear a mask over your nose and mouth if you must be around other people or animals, including pets (even at home).  You don't need to wear the mask if you are alone. If you can't put on a mask (because of trouble breathing for example), cover your coughs and sneezes in some other way. Try to stay at least 6 feet away from other people. This will help protect the people around you.  Masks should not be placed on young children under age 2 years, anyone who has trouble breathing, or anyone who is not able to remove the mask without help. Note: During the COVID-19 pandemic, medical grade facemasks are reserved for healthcare workers and some first responders. You may need to make a mask using a scarf or bandana. Cover your coughs and sneezes.  Cover your mouth and nose with a tissue when you cough or sneeze.    Throw used tissues in a lined trash can.  Immediately wash your hands with soap and water for at least 20 seconds. If soap and water are not available, clean your hands with an alcohol-based hand sanitizer that contains at least 60% alcohol. Clean  your hands often.  Wash your hands often with soap and water for at least 20 seconds. This is especially important after blowing your nose, coughing, or sneezing; going to the bathroom; and before eating or preparing food.  Use hand sanitizer if soap and water are not available. Use an alcohol-based hand sanitizer with at least 60% alcohol, covering all surfaces of your hands and rubbing them together until they feel dry.  Soap and water are the best option, especially if your hands are visibly dirty.  Avoid touching your eyes, nose, and mouth with unwashed hands. Avoid sharing personal household items.  Do not share dishes, drinking glasses, cups, eating utensils, towels, or bedding with other people in your home.  Wash these items thoroughly after using them with soap and water or put them in the dishwasher. Clean all "high-touch" surfaces everyday.  Clean and disinfect high-touch surfaces in your "sick room" and bathroom. Let someone else clean and disinfect surfaces in common areas, but not your bedroom and bathroom.  If a caregiver or other person needs to clean and disinfect a sick person's bedroom or bathroom, they should do so on an as-needed basis. The caregiver/other person should wear a mask and wait as long as possible after the sick person has used the bathroom. High-touch surfaces include phones, remote controls, counters, tabletops, doorknobs, bathroom fixtures, toilets, keyboards, tablets, and bedside tables.  Clean and disinfect areas that may have blood, stool, or body fluids on them.  Use household cleaners and disinfectants. Clean the area or item with soap and water or another detergent if it is dirty. Then use a household disinfectant. ? Be sure to follow the instructions on the label to ensure safe and effective use of the product. Many products recommend keeping the surface wet for several minutes to ensure germs are killed. Many also recommend precautions such as  wearing gloves and making sure you have good ventilation during use of the product. ? Most EPA-registered household disinfectants should be effective. When you can be around others after you had or likely had COVID-19 When you can be around others (end home isolation) depends on different factors for different situations.  I think or know I had COVID-19, and I had symptoms ? You can be with others after  24 hours with no fever AND  Symptoms improved AND  10 days since symptoms first appeared ? Depending on your healthcare provider's advice and availability of testing, you might get tested to see if you still have COVID-19. If you will be tested, you can be around others when you have no fever, symptoms have improved, and you receive two negative test results in a row, at least 24 hours apart.  I tested positive for COVID-19 but had no symptoms ? If you continue to have no symptoms, you can be with others after:  10 days have passed since test ? Depending on your healthcare provider's advice and availability of testing, you might get tested to see if you still have COVID-19. If you will be tested, you can be around others after you receive two negative test results in a row, at least 24 hours apart. ? If you develop symptoms after testing positive, follow the guidance above for "  I think or know I had COVID, and I had symptoms." cdc.gov/coronavirus 03/17/2019 This information is not intended to replace advice given to you by your health care provider. Make sure you discuss any questions you have with your health care provider. Document Revised: 04/02/2019 Document Reviewed: 02/03/2019 Elsevier Patient Education  2020 Elsevier Inc.  

## 2019-09-15 ENCOUNTER — Ambulatory Visit: Payer: BC Managed Care – PPO | Admitting: Endocrinology

## 2019-10-02 ENCOUNTER — Other Ambulatory Visit: Payer: Self-pay

## 2019-10-06 ENCOUNTER — Ambulatory Visit (INDEPENDENT_AMBULATORY_CARE_PROVIDER_SITE_OTHER): Payer: BC Managed Care – PPO | Admitting: Endocrinology

## 2019-10-06 ENCOUNTER — Encounter: Payer: Self-pay | Admitting: Endocrinology

## 2019-10-06 ENCOUNTER — Other Ambulatory Visit: Payer: Self-pay

## 2019-10-06 VITALS — BP 104/70 | HR 82 | Ht 64.5 in | Wt 155.6 lb

## 2019-10-06 DIAGNOSIS — E0590 Thyrotoxicosis, unspecified without thyrotoxic crisis or storm: Secondary | ICD-10-CM | POA: Diagnosis not present

## 2019-10-06 NOTE — Patient Instructions (Signed)
Blood tests are requested for you today.  We'll let you know about the results.  If ever you have fever while taking methimazole, stop it and call us, even if the reason is obvious, because of the risk of a rare side-effect.   It is best to never miss the medication.  However, if you do miss it, next best is to double up the next time.   If the thyroid swelling bother you, I would be happy to recheck the ultrasound Please come back for a follow-up appointment in 4-5 months.

## 2019-10-06 NOTE — Progress Notes (Signed)
Subjective:    Patient ID: Makayla Jackson, female    DOB: May 14, 1966, 54 y.o.   MRN: [id number]  HPI Pt returns for f/u of MNG (dx'ed 2013; TSH is slightly low; she has had multiple bxs and cyst drainage procedures; she had parathyroidectomy in 2017; last [place] in 2020 showed no change, and no f/u US was advised; tapazole was rx'ed in 2020).    Past Medical History:  Diagnosis Date  . Arthritis   . Asthma   . Bowel obstruction ([place]) 08/2016  . Diverticulitis   . Miscarriage   . Multiple thyroid nodules   . Osteopenia   . Parathyroid tumor   . Pneumonia   . SBO (small bowel obstruction) ([place])   . Thyroid disease     Past Surgical History:  Procedure Laterality Date  . COLON SURGERY  2014   Diverticulosis resection, after diverticulitis  . FINE NEEDLE ASPIRATION     Thyroid Nodule  . KNEE ARTHROSCOPY Right   . NASAL SINUS SURGERY  1998  . PARATHYROIDECTOMY  12/2014  . PARTIAL HYSTERECTOMY  2010  . TONSILLECTOMY      Social History   Socioeconomic History  . Marital status: Married    Spouse name: Not on file  . Number of children: 3  . Years of education: Not on file  . Highest education level: Not on file  Occupational History  . Occupation: Product manager: [employer]  Tobacco Use  . Smoking status: Never Smoker  . Smokeless tobacco: Never Used  Substance and Sexual Activity  . Alcohol use: Yes    Alcohol/week: 1.0 standard drinks    Types: 1 Glasses of wine per week    Comment: occasional glass of wine   . Drug use: No  . Sexual activity: Not on file  Other Topics Concern  . Not on file  Social History Narrative   The patient is married.  Her husband is retiring as Occupational hygienist of [employer] after serving in [place].   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[place] 2 glasses of wine daily, no tobacco no drug use   Social Determinants of Radio broadcast assistant Strain:   . Difficulty of Paying  Living Expenses: Not on file  Food Insecurity:   . Worried About Charity fundraiser in the Last Year: Not on file  . Ran Out of Food in the Last Year: Not on file  Transportation Needs:   . Lack of Transportation (Medical): Not on file  . Lack of Transportation (Non-Medical): Not on file  Physical Activity:   . Days of Exercise per Week: Not on file  . Minutes of Exercise per Session: Not on file  Stress:   . Feeling of Stress : Not on file  Social Connections:   . Frequency of Communication with Friends and Family: Not on file  . Frequency of Social Gatherings with Friends and Family: Not on file  . Attends Religious Services: Not on file  . Active Member of Clubs or Organizations: Not on file  . Attends Archivist Meetings: Not on file  . Marital Status: Not on file  Intimate Partner Violence:   . Fear of Current or Ex-Partner: Not on file  . Emotionally Abused: Not on file  . Physically Abused: Not on file  . Sexually Abused: Not on file    Current Outpatient Medications on File Prior  to Visit  Medication Sig Dispense Refill  . acetaminophen (TYLENOL) 325 MG tablet Take 650 mg by mouth every 6 (six) hours.    . ALPRAZolam (XANAX) 0.25 MG tablet TAKE 0.5 to 1 TABLET(0.25 MG) BY MOUTH qhs AS NEEDED FOR SLEEP OR ANXIETY 30 tablet 1  . Azelastine-Fluticasone 137-50 MCG/ACT SUSP 1 spray by Nasal route daily. In each nostril    . cetirizine (ZYRTEC) 10 MG tablet Take 10 mg by mouth daily.    Marland Kitchen FLUoxetine (PROZAC) 20 MG tablet Take 1 tablet (20 mg total) by mouth daily. 30 tablet 1  . fluticasone (FLOVENT HFA) 110 MCG/ACT inhaler Inhale 2 puffs into the lungs 2 (two) times daily. 1 Inhaler 3  . methimazole (TAPAZOLE) 5 MG tablet Take 0.5 tablets (2.5 mg total) by mouth daily. 15 tablet 2  . SUMAtriptan (IMITREX) 50 MG tablet Take 1 tablet (50 mg total) by mouth daily as needed for Migraine (may repeat in 2 hours). 10 tablet 3   No current facility-administered medications  on file prior to visit.    Allergies  Allergen Reactions  . Other Anaphylaxis and Hives    Bee stings  . Shellfish Allergy Swelling  . Penicillins Hives  . Sulfa Antibiotics Hives  . Prednisone     Pt stated, "It made me feel slightly agitated. I could not sleep well"    Family History  Problem Relation Age of Onset  . Arthritis Mother   . Arthritis/Rheumatoid Mother   . Asthma Mother   . Heart disease Mother   . Hypertension Mother   . Stroke Mother   . Vision loss Mother   . Thyroid disease Mother   . Colon polyps Mother   . Thyroid cancer Mother   . Arthritis Father   . Heart disease Father   . Hypertension Father   . Colon polyps Father   . Diverticulitis Father        with colon surgery  . Arthritis/Rheumatoid Maternal Aunt   . Arthritis/Rheumatoid Maternal Uncle   . Diabetes Maternal Grandmother   . Lung cancer Maternal Grandfather   . Cancer Paternal Grandfather        type unknown  . Colon cancer Cousin   . Thyroid cancer Cousin   . Breast cancer Cousin     BP 104/70 (BP Location: Left Arm, Patient Position: Sitting, Cuff Size: Normal)   Pulse 82   Ht 5' 4.5" (1.638 m)   Wt 155 lb 9.6 oz (70.6 kg)   SpO2 98%   BMI 26.30 kg/m    Review of Systems Denies fever.      Objective:   Physical Exam VITAL SIGNS:  See vs page GENERAL: no distress NECK: right thyroid fullness is again noted.       Lab Results  Component Value Date   TSH 0.48 10/06/2019   T4TOTAL 6.8 04/07/2019      Assessment & Plan:  MNG: clinically stable Low-normal TSH: I told pt she will develop hyperthyroidism with time.  Patient Instructions  Blood tests are requested for you today.  We'll let you know about the results.  If ever you have fever while taking methimazole, stop it and call us, even if the reason is obvious, because of the risk of a rare side-effect.   It is best to never miss the medication.  However, if you do miss it, next best is to double up the next  time.   If the thyroid swelling bother you, I would be  happy to recheck the ultrasound Please come back for a follow-up appointment in 4-5 months.

## 2019-10-07 LAB — TSH: TSH: 0.48 u[IU]/mL (ref 0.35–4.50)

## 2019-10-07 LAB — T4, FREE: Free T4: 0.78 ng/dL (ref 0.60–1.60)

## 2019-10-21 ENCOUNTER — Telehealth: Payer: Self-pay | Admitting: Internal Medicine

## 2019-10-21 ENCOUNTER — Other Ambulatory Visit (INDEPENDENT_AMBULATORY_CARE_PROVIDER_SITE_OTHER): Payer: BC Managed Care – PPO

## 2019-10-21 ENCOUNTER — Ambulatory Visit (INDEPENDENT_AMBULATORY_CARE_PROVIDER_SITE_OTHER)
Admission: RE | Admit: 2019-10-21 | Discharge: 2019-10-21 | Disposition: A | Discharge status: Home / Self Care | Payer: BC Managed Care – PPO | Source: Ambulatory Visit | Attending: Internal Medicine | Admitting: Internal Medicine

## 2019-10-21 ENCOUNTER — Other Ambulatory Visit: Payer: Self-pay | Admitting: *Deleted

## 2019-10-21 DIAGNOSIS — R197 Diarrhea, unspecified: Secondary | ICD-10-CM

## 2019-10-21 DIAGNOSIS — R112 Nausea with vomiting, unspecified: Secondary | ICD-10-CM

## 2019-10-21 DIAGNOSIS — R1011 Right upper quadrant pain: Secondary | ICD-10-CM

## 2019-10-21 LAB — COMPREHENSIVE METABOLIC PANEL
ALT: 15 U/L (ref 0–35)
AST: 20 U/L (ref 0–37)
Albumin: 4.2 g/dL (ref 3.5–5.2)
Alkaline Phosphatase: 56 U/L (ref 39–117)
BUN: 10 mg/dL (ref 6–23)
CO2: 29 mEq/L (ref 19–32)
Calcium: 9.4 mg/dL (ref 8.4–10.5)
Chloride: 103 mEq/L (ref 96–112)
Creatinine, Ser: 0.79 mg/dL (ref 0.40–1.20)
GFR: 76.36 mL/min (ref >60.00)
Glucose, Bld: 109 mg/dL — ABNORMAL HIGH (ref 70–99)
Potassium: 3.3 mEq/L — ABNORMAL LOW (ref 3.5–5.1)
Sodium: 139 mEq/L (ref 135–145)
Total Bilirubin: 0.6 mg/dL (ref 0.2–1.2)
Total Protein: 7.1 g/dL (ref 6.0–8.3)

## 2019-10-21 LAB — LIPASE: Lipase: 42 U/L (ref 11.0–59.0)

## 2019-10-21 LAB — CBC WITH DIFFERENTIAL/PLATELET
Basophils Absolute: 0 10*3/uL (ref 0.0–0.1)
Basophils Relative: 0.3 % (ref 0.0–3.0)
Eosinophils Absolute: 0.1 10*3/uL (ref 0.0–0.7)
Eosinophils Relative: 1 % (ref 0.0–5.0)
HCT: 43.8 % (ref 36.0–46.0)
Hemoglobin: 14.8 g/dL (ref 12.0–15.0)
Lymphocytes Relative: 13.6 % (ref 12.0–46.0)
Lymphs Abs: 1.2 10*3/uL (ref 0.7–4.0)
MCHC: 33.7 g/dL (ref 30.0–36.0)
MCV: 88.8 fl (ref 78.0–100.0)
Monocytes Absolute: 0.9 10*3/uL (ref 0.1–1.0)
Monocytes Relative: 10.1 % (ref 3.0–12.0)
Neutro Abs: 6.4 10*3/uL (ref 1.4–7.7)
Neutrophils Relative %: 75 % (ref 43.0–77.0)
Platelets: 252 10*3/uL (ref 150.0–400.0)
RBC: 4.93 Mil/uL (ref 3.87–5.11)
RDW: 13.2 % (ref 11.5–15.5)
WBC: 8.6 10*3/uL (ref 4.0–10.5)

## 2019-10-21 MED ORDER — ONDANSETRON 4 MG PO TBDP: 4.0000 mg | ORAL_TABLET | Freq: Four times a day (QID) | ORAL | 0 refills | Status: DC | PRN

## 2019-10-21 NOTE — Telephone Encounter (Signed)
We can off her dissolvable ondansetron 4 mg 1 every 6 hrs prn # 40 no refills  Clear liquid diet and advance as tolerated  If she is able to come today would do CBC, CMET, lipase and DG abdomen 2 view  Dx nausea and vomiting and abd pain

## 2019-10-21 NOTE — Telephone Encounter (Signed)
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 No V/D since 3 am. She has a history of bowel obstruction and is convinced her abd, N/V/D, and bloating is possible due to a PBO. When asking the patient when she'd been in contact with children, she emphasized that her "stomach bug" was not due to recent in person teaching because her abd cramps are "high in my stomach and not lower." She has been scheduled to see JZ tomorrow at 2:00 pm, last OV with Dr. Carlean Purl 06/2018. Please advise?

## 2019-10-21 NOTE — Telephone Encounter (Signed)
Zofran prescription sent to the patients pharmacy and written by physician.   Notified to have only clears and advance as tolerated.   Orders placed for labs and xray, she confirmed sh will be in today to complete.

## 2019-10-21 NOTE — Telephone Encounter (Signed)
Pt reported she has been experiencing diarrhea, N/V and bloating.  She has had a bowel obstruction in the past and believes that she has a partial bowel obstruction.  Pt stated that she had been vomiting all night until bile came up.  Please call back to discuss.  Previous OV was 06/12/2018.

## 2019-10-22 ENCOUNTER — Ambulatory Visit (INDEPENDENT_AMBULATORY_CARE_PROVIDER_SITE_OTHER): Payer: BC Managed Care – PPO | Admitting: Gastroenterology

## 2019-10-22 ENCOUNTER — Encounter: Payer: Self-pay | Admitting: Gastroenterology

## 2019-10-22 VITALS — BP 118/70 | HR 76 | Temp 98.5°F | Ht 64.5 in | Wt 152.0 lb

## 2019-10-22 DIAGNOSIS — R112 Nausea with vomiting, unspecified: Secondary | ICD-10-CM | POA: Diagnosis not present

## 2019-10-22 DIAGNOSIS — R1013 Epigastric pain: Secondary | ICD-10-CM | POA: Diagnosis not present

## 2019-10-22 DIAGNOSIS — Z8719 Personal history of other diseases of the digestive system: Secondary | ICD-10-CM | POA: Diagnosis not present

## 2019-10-22 NOTE — Patient Instructions (Signed)
If you are age 53 or older, your body mass index should be between 23-30. Your Body mass index is 25.69 kg/m. If this is out of the aforementioned range listed, please consider follow up with your Primary Care Provider.  If you are age 21 or younger, your body mass index should be between 19-25. Your Body mass index is 25.69 kg/m. If this is out of the aformentioned range listed, please consider follow up with your Primary Care Provider.   Start Clinical cytogeneticist probiotic daily.   Call back with recurrent symptoms and slowly increase diet.

## 2019-10-22 NOTE — Progress Notes (Signed)
10/22/2019 KACE ASEBEDO [id number] 06/30/1966   HISTORY OF PRESENT ILLNESS: This is a pleasant 54 year old female who is a patient of Dr. Celesta Aver.  She is here today to follow-up on complaints of nausea, vomiting and abdominal pain.  She tells me that she has had a history of small bowel obstruction in the past.  She says that 2 days ago she had sudden onset of epigastric abdominal pain with distention, nausea, and vomiting.  She says that it was very reminiscent of her previous obstruction as she never has vomiting to the degree that she had with her previous obstruction and also that she had the other day when the symptoms occurred.  She says that she vomited 7 or 8 times Tuesday night.  She says that usually when she has a gastroenteritis she has cramping and discomfort in her lower abdomen with diarrhea.  After some time she did then began to have some loose stools on Tuesday evening.  Yesterday she was feeling better but had called our office and an abdominal x-ray was performed as well as some labs.  Abdominal x-ray was normal.  CBC, CMP, and lipase were all unremarkable except for a very mild hyponatremia with potassium at 3.3.  Nonetheless, she is feeling better today.  Ate very small amount this morning.  Says that she is passing flatus.  While she is here she also mentions that she tends to alternate between loose stools and constipation.  She says that she has tried Benefiber in the past and takes it when she begins to have some loose stools, but then after taking it for a couple of days she tends to get constipated from it.  She does have a history of IBS.  Last colonoscopy was in 2014 without polyps, only diverticulosis.  This is prior to her elective resection because of recurrent severe diverticulitis.  Recall 2024.  As stated above she does have history of bowel obstruction that has resolved with conservative measures.   Past Medical History:  Diagnosis Date  . Arthritis     . Asthma   . Bowel obstruction ([place]) 08/2016  . Diverticulitis   . Miscarriage   . Multiple thyroid nodules   . Osteopenia   . Parathyroid tumor   . Pneumonia   . SBO (small bowel obstruction) ([place])   . Thyroid disease    Past Surgical History:  Procedure Laterality Date  . COLON SURGERY  2014   Diverticulosis resection, after diverticulitis  . FINE NEEDLE ASPIRATION     Thyroid Nodule  . KNEE ARTHROSCOPY Right   . NASAL SINUS SURGERY  1998  . PARATHYROIDECTOMY  12/2014  . PARTIAL HYSTERECTOMY  2010  . TONSILLECTOMY      reports that she has never smoked. She has never used smokeless tobacco. She reports current alcohol use of about 1.0 standard drinks of alcohol per week. She reports that she does not use drugs. family history includes Arthritis in her father and mother; Arthritis/Rheumatoid in her maternal aunt, maternal uncle, and mother; Asthma in her mother; Breast cancer in her cousin; Cancer in her paternal grandfather; Colon cancer in her cousin; Colon polyps in her father and mother; Diabetes in her maternal grandmother; Diverticulitis in her father; Heart disease in her father and mother; Hypertension in her father and mother; Lung cancer in her maternal grandfather; Stroke in her mother; Thyroid cancer in her cousin and mother; Thyroid disease in her mother; Vision loss in her mother. Allergies  Allergen  Reactions  . Other Anaphylaxis and Hives    Bee stings  . Shellfish Allergy Swelling  . Penicillins Hives  . Sulfa Antibiotics Hives  . Prednisone     Pt stated, "It made me feel slightly agitated. I could not sleep well"      Outpatient Encounter Medications as of 10/22/2019  Medication Sig  . acetaminophen (TYLENOL) 325 MG tablet Take 650 mg by mouth every 6 (six) hours.  . ALPRAZolam (XANAX) 0.25 MG tablet TAKE 0.5 to 1 TABLET(0.25 MG) BY MOUTH qhs AS NEEDED FOR SLEEP OR ANXIETY  . Azelastine-Fluticasone 137-50 MCG/ACT SUSP 1 spray by Nasal route daily. In each  nostril  . cetirizine (ZYRTEC) 10 MG tablet Take 10 mg by mouth daily.  . fluticasone (FLOVENT HFA) 110 MCG/ACT inhaler Inhale 2 puffs into the lungs 2 (two) times daily.  . ondansetron (ZOFRAN ODT) 4 MG disintegrating tablet Take 1 tablet (4 mg total) by mouth every 6 (six) hours as needed for nausea or vomiting.  . SUMAtriptan (IMITREX) 50 MG tablet Take 1 tablet (50 mg total) by mouth daily as needed for Migraine (may repeat in 2 hours).  . [DISCONTINUED] FLUoxetine (PROZAC) 20 MG tablet Take 1 tablet (20 mg total) by mouth daily.  . [DISCONTINUED] methimazole (TAPAZOLE) 5 MG tablet Take 0.5 tablets (2.5 mg total) by mouth daily.   No facility-administered encounter medications on file as of 10/22/2019.     REVIEW OF SYSTEMS  : All other systems reviewed and negative except where noted in the History of Present Illness.   PHYSICAL EXAM: BP 118/70   Pulse 76   Temp 98.5 F (36.9 C)   Ht 5' 4.5" (1.638 m)   Wt 152 lb (68.9 kg)   BMI 25.69 kg/m  General: Well developed white female in no acute distress; appears well. Head: Normocephalic and atraumatic Eyes:  Sclerae anicteric, conjunctiva pink. Ears: Normal auditory acuity  Lungs: Clear throughout to auscultation; no increased WOB. Heart: Regular rate and rhythm; no M/R/G. Abdomen: Soft, non-distended.  BS present.  Minimal upper abdominal TTP. Musculoskeletal: Symmetrical with no gross deformities  Skin: No lesions on visible extremities Extremities: No edema  Neurological: Alert oriented x 4, grossly non-focal Psychological:  Alert and cooperative. Normal mood and affect  ASSESSMENT AND PLAN: *54 year old female with sudden onset nausea, vomiting, and epigastric abdominal pain a couple of days ago.  Was reminiscent of previous small bowel obstruction.  Abdominal x-ray and labs yesterday looked okay and she is feeling better today.  Passing flatus and had liquid stools yesterday.  We discussed possibility of maybe the fact that  she had a transient partial small bowel obstruction versus a gastroenteritis.  She just feels that it was more reminiscent of her bowel obstruction previously than any type of gastroenteritis that she is ever had in the past.  Nonetheless, it appears that symptoms have resolved and imaging and labs are okay.  She was advised to slowly advance her diet, trying to avoid very fibrous foods for the next week or 2. *Alternating constipation diarrhea: Sounds like she walks a very thin line with these.  Says that she takes fiber supplements when her stools become a little bit more loose to create bulk, but then after taking it for a few days it makes her constipated.  I think we are going to have a hard time trying to find anything else that is going to help her without pushing her more so one way or the other.  Advised  that she could try a daily probiotic such as align or Florastor.   CC:  Janora Norlander, DO

## 2019-10-23 ENCOUNTER — Encounter: Payer: Self-pay | Admitting: Gastroenterology

## 2019-10-23 DIAGNOSIS — R1013 Epigastric pain: Secondary | ICD-10-CM | POA: Insufficient documentation

## 2019-10-23 DIAGNOSIS — R112 Nausea with vomiting, unspecified: Secondary | ICD-10-CM | POA: Insufficient documentation

## 2019-11-27 ENCOUNTER — Ambulatory Visit (INDEPENDENT_AMBULATORY_CARE_PROVIDER_SITE_OTHER): Payer: BC Managed Care – PPO | Admitting: Family Medicine

## 2019-11-27 ENCOUNTER — Encounter: Payer: Self-pay | Admitting: Family Medicine

## 2019-11-27 DIAGNOSIS — J019 Acute sinusitis, unspecified: Secondary | ICD-10-CM

## 2019-11-27 MED ORDER — CEFDINIR 300 MG PO CAPS: 300.0000 mg | ORAL_CAPSULE | Freq: Two times a day (BID) | ORAL | 0 refills | Status: DC

## 2019-11-27 NOTE — Progress Notes (Signed)
Virtual Visit via Telephone Note  I connected with Makayla Jackson on 11/27/19 at 3:14 PM by telephone and verified that I am speaking with the correct person using two identifiers. Makayla Jackson is currently located at home and nobody is currently with her during this visit. The provider, Loman Brooklyn, FNP is located in their home at time of visit.  I discussed the limitations, risks, security and privacy concerns of performing an evaluation and management service by telephone and the availability of in person appointments. I also discussed with the patient that there may be a patient responsible charge related to this service. The patient expressed understanding and agreed to proceed.  Subjective: PCP: Janora Norlander, DO  Chief Complaint  Patient presents with  . Allergies   Patient complains of cough, head congestion, runny nose, sneezing, facial pain/pressure and postnasal drainage. Onset of symptoms was >1 week ago, gradually worsening since that time. She is drinking plenty of fluids. Evaluation to date: none. Treatment to date: antihistamines and nasal steroids. She has a history of allergies and sinusitis. She does not smoke.   ROS: Per HPI  Current Outpatient Medications:  .  acetaminophen (TYLENOL) 325 MG tablet, Take 650 mg by mouth every 6 (six) hours., Disp: , Rfl:  .  ALPRAZolam (XANAX) 0.25 MG tablet, TAKE 0.5 to 1 TABLET(0.25 MG) BY MOUTH qhs AS NEEDED FOR SLEEP OR ANXIETY, Disp: 30 tablet, Rfl: 1 .  Azelastine-Fluticasone 137-50 MCG/ACT SUSP, 1 spray by Nasal route daily. In each nostril, Disp: , Rfl:  .  cetirizine (ZYRTEC) 10 MG tablet, Take 10 mg by mouth daily., Disp: , Rfl:  .  fluticasone (FLOVENT HFA) 110 MCG/ACT inhaler, Inhale 2 puffs into the lungs 2 (two) times daily., Disp: 1 Inhaler, Rfl: 3 .  ondansetron (ZOFRAN ODT) 4 MG disintegrating tablet, Take 1 tablet (4 mg total) by mouth every 6 (six) hours as needed for nausea or vomiting., Disp: 40 tablet,  Rfl: 0 .  SUMAtriptan (IMITREX) 50 MG tablet, Take 1 tablet (50 mg total) by mouth daily as needed for Migraine (may repeat in 2 hours)., Disp: 10 tablet, Rfl: 3  Allergies  Allergen Reactions  . Other Anaphylaxis and Hives    Bee stings  . Shellfish Allergy Swelling  . Penicillins Hives  . Sulfa Antibiotics Hives  . Prednisone     Pt stated, "It made me feel slightly agitated. I could not sleep well"   Past Medical History:  Diagnosis Date  . Arthritis   . Asthma   . Bowel obstruction ([place]) 08/2016  . Diverticulitis   . Miscarriage   . Multiple thyroid nodules   . Osteopenia   . Parathyroid tumor   . Pneumonia   . SBO (small bowel obstruction) ([place])   . Thyroid disease     Observations/Objective: A&O  No respiratory distress or wheezing audible over the phone Mood, judgement, and thought processes all WNL   Assessment and Plan: 1. Acute non-recurrent sinusitis, unspecified location - cefdinir (OMNICEF) 300 MG capsule; Take 1 capsule (300 mg total) by mouth 2 (two) times daily. 1 po BID  Dispense: 20 capsule; Refill: 0   Follow Up Instructions:  I discussed the assessment and treatment plan with the patient. The patient was provided an opportunity to ask questions and all were answered. The patient agreed with the plan and demonstrated an understanding of the instructions.   The patient was advised to call back or seek an in-person evaluation if the  symptoms worsen or if the condition fails to improve as anticipated.  The above assessment and management plan was discussed with the patient. The patient verbalized understanding of and has agreed to the management plan. Patient is aware to call the clinic if symptoms persist or worsen. Patient is aware when to return to the clinic for a follow-up visit. Patient educated on when it is appropriate to go to the emergency department.   Time call ended: 3:21 PM  I provided 9 minutes of non-face-to-face time during this  encounter.  Hendricks Limes, MSN, APRN, FNP-C [facility] 11/27/19

## 2020-02-09 ENCOUNTER — Ambulatory Visit: Payer: BC Managed Care – PPO | Admitting: Family Medicine

## 2020-02-09 ENCOUNTER — Other Ambulatory Visit: Payer: Self-pay

## 2020-02-09 ENCOUNTER — Encounter: Payer: Self-pay | Admitting: Family Medicine

## 2020-02-09 VITALS — BP 107/67 | HR 66 | Temp 97.8°F | Ht 64.5 in | Wt 152.8 lb

## 2020-02-09 DIAGNOSIS — F419 Anxiety disorder, unspecified: Secondary | ICD-10-CM | POA: Diagnosis not present

## 2020-02-09 DIAGNOSIS — E0590 Thyrotoxicosis, unspecified without thyrotoxic crisis or storm: Secondary | ICD-10-CM | POA: Diagnosis not present

## 2020-02-09 DIAGNOSIS — Z23 Encounter for immunization: Secondary | ICD-10-CM | POA: Diagnosis not present

## 2020-02-09 DIAGNOSIS — R5383 Other fatigue: Secondary | ICD-10-CM

## 2020-02-09 DIAGNOSIS — Z79899 Other long term (current) drug therapy: Secondary | ICD-10-CM

## 2020-02-09 DIAGNOSIS — R300 Dysuria: Secondary | ICD-10-CM

## 2020-02-09 DIAGNOSIS — E892 Postprocedural hypoparathyroidism: Secondary | ICD-10-CM

## 2020-02-09 DIAGNOSIS — Z9009 Acquired absence of other part of head and neck: Secondary | ICD-10-CM

## 2020-02-09 DIAGNOSIS — M2550 Pain in unspecified joint: Secondary | ICD-10-CM

## 2020-02-09 DIAGNOSIS — Z8269 Family history of other diseases of the musculoskeletal system and connective tissue: Secondary | ICD-10-CM

## 2020-02-09 DIAGNOSIS — G43809 Other migraine, not intractable, without status migrainosus: Secondary | ICD-10-CM

## 2020-02-09 LAB — MICROSCOPIC EXAMINATION
Bacteria, UA: NONE SEEN
RBC, Urine: NONE SEEN /hpf (ref 0–2)
Renal Epithel, UA: NONE SEEN /hpf

## 2020-02-09 LAB — BAYER DCA HB A1C WAIVED: HB A1C (BAYER DCA - WAIVED): 5.2 % (ref <7.0)

## 2020-02-09 LAB — URINALYSIS, COMPLETE
Bilirubin, UA: NEGATIVE
Glucose, UA: NEGATIVE
Ketones, UA: NEGATIVE
Leukocytes,UA: NEGATIVE
Nitrite, UA: NEGATIVE
Protein,UA: NEGATIVE
RBC, UA: NEGATIVE
Specific Gravity, UA: 1.015 (ref 1.005–1.030)
Urobilinogen, Ur: 0.2 mg/dL (ref 0.2–1.0)
pH, UA: 7 (ref 5.0–7.5)

## 2020-02-09 MED ORDER — SUMATRIPTAN SUCCINATE 50 MG PO TABS: ORAL_TABLET | ORAL | 3 refills | Status: DC

## 2020-02-09 MED ORDER — ALPRAZOLAM 0.25 MG PO TABS: ORAL_TABLET | ORAL | 1 refills | Status: DC

## 2020-02-09 NOTE — Progress Notes (Signed)
Subjective: CC: 30mfollow up, panic/ palpitations. PCP: [name] DO HYOK:HTXHFB Makayla Jackson a 54y.o. female presenting to clinic today for:  1.  Panic/ hyperthyroidism MedHx significant for hyperthyroidism/ thyroid nodule, followed by Dr ELoanne Drilling FamHx significant for afib in her parents, 1 of which had a stroke as result of atrial fibrillation and the other an MI.  Patient reports that she has been totally out of her alprazolam for greater than 1 month now.  She only uses this very rarely, never more than 2-3 times per week and usually only half a tablet each time.  She does not report any hallucinations, memory changes, falls.  She never started the Prozac because she did not want to take a daily medication.  She does report some increased fatigue has been ongoing for about a month but again this has not been related to the medication as she has been out.  She denies any snoring.  No observed apneic spells.  She was recently checked up by her endocrinologist but no medication changes were made.  She does have a history of parathyroidectomy but only had one taken out.  She notes that the last time she felt like this her parathyroid was acting up.  2.  Dysuria Patient reports some mild dysuria and low back pain that it grows across the entire low back for about 1 week now.  No observed hematuria, fevers, nausea, vomiting.  She has been pushing fluids and cranberry juice.  3.  Migraine headaches Patient reports intermittent migraine headaches occurring 1-2 times per month and sometimes 0/month.  These are relieved by Imitrex.  Last migraine was about 1 month ago.  She needs a refill.  4.  Polyarthralgia Patient reports polyarthralgia, particularly in the MCPs of bilateral hands.  She has pain in both knees and in bilateral feet.  She has had some back pain as above over the last week or so.  There is a strong family history of rheumatoid arthritis in her mother, who also suffers from  OA.  She reports some fatigue as above.  Symptoms are refractory to OTC analgesics.  She has previously had agitation and difficulty sleeping with prednisone.   ROS: Per HPI  Allergies  Allergen Reactions  . Other Anaphylaxis and Hives    Bee stings  . Shellfish Allergy Swelling  . Penicillins Hives  . Sulfa Antibiotics Hives  . Prednisone     Pt stated, "It made me feel slightly agitated. I could not sleep well"   Past Medical History:  Diagnosis Date  . Arthritis   . Asthma   . Bowel obstruction ([place] 08/2016  . Diverticulitis   . Miscarriage   . Multiple thyroid nodules   . Osteopenia   . Parathyroid tumor   . Pneumonia   . SBO (small bowel obstruction) ([place]   . Thyroid disease     Current Outpatient Medications:  .  acetaminophen (TYLENOL) 325 MG tablet, Take 650 mg by mouth every 6 (six) hours., Disp: , Rfl:  .  ALPRAZolam (XANAX) 0.25 MG tablet, TAKE 0.5 to 1 TABLET(0.25 MG) BY MOUTH qhs AS NEEDED FOR SLEEP OR ANXIETY, Disp: 30 tablet, Rfl: 1 .  Azelastine-Fluticasone 137-50 MCG/ACT SUSP, 1 spray by Nasal route daily. In each nostril, Disp: , Rfl:  .  cefdinir (OMNICEF) 300 MG capsule, Take 1 capsule (300 mg total) by mouth 2 (two) times daily. 1 po BID, Disp: 20 capsule, Rfl: 0 .  cetirizine (ZYRTEC) 10 MG  tablet, Take 10 mg by mouth daily., Disp: , Rfl:  .  fluticasone (FLOVENT HFA) 110 MCG/ACT inhaler, Inhale 2 puffs into the lungs 2 (two) times daily., Disp: 1 Inhaler, Rfl: 3 .  ondansetron (ZOFRAN ODT) 4 MG disintegrating tablet, Take 1 tablet (4 mg total) by mouth every 6 (six) hours as needed for nausea or vomiting., Disp: 40 tablet, Rfl: 0 .  SUMAtriptan (IMITREX) 50 MG tablet, Take 1 tablet (50 mg total) by mouth daily as needed for Migraine (may repeat in 2 hours)., Disp: 10 tablet, Rfl: 3 Social History   Socioeconomic History  . Marital status: Married    Spouse name: Not on file  . Number of children: 3  . Years of education: Not on file  . Highest  education level: Not on file  Occupational History  . Occupation: Product manager: [employer]  Tobacco Use  . Smoking status: Never Smoker  . Smokeless tobacco: Never Used  Vaping Use  . Vaping Use: Never used  Substance and Sexual Activity  . Alcohol use: Yes    Alcohol/week: 1.0 standard drink    Types: 1 Glasses of wine per week    Comment: occasional glass of wine   . Drug use: No  . Sexual activity: Not on file  Other Topics Concern  . Not on file  Social History Narrative   The patient is married.  Her husband is retiring as Occupational hygienist of [employer] after serving in [place].   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 Difficulty of Paying Living Expenses:   Food Insecurity:   . Worried About Charity fundraiser in the Last Year:   . Arboriculturist in the Last Year:   Transportation Needs:   . Film/video editor (Medical):   Marland Kitchen Lack of Transportation (Non-Medical):   Physical Activity:   . Days of Exercise per Week:   . Minutes of Exercise per Session:   Stress:   . Feeling of Stress :   Social Connections:   . Frequency of Communication with Friends and Family:   . Frequency of Social Gatherings with Friends and Family:   . Attends Religious Services:   . Active Member of Clubs or Organizations:   . Attends Archivist Meetings:   Marland Kitchen Marital Status:   Intimate Partner Violence:   . Fear of Current or Ex-Partner:   . Emotionally Abused:   Marland Kitchen Physically Abused:   . Sexually Abused:    Family History  Problem Relation Age of Onset  . Arthritis Mother   . Arthritis/Rheumatoid Mother   . Asthma Mother   . Heart disease Mother   . Hypertension Mother   . Stroke Mother   . Vision loss Mother   . Thyroid disease Mother   . Colon polyps Mother   . Thyroid cancer Mother   .  Arthritis Father   . Heart disease Father   . Hypertension Father   . Colon polyps Father   . Diverticulitis Father        with colon surgery  . Arthritis/Rheumatoid Maternal Aunt   . Arthritis/Rheumatoid Maternal Uncle   . Diabetes Maternal Grandmother   . Lung cancer Maternal Grandfather   . Cancer Paternal Grandfather  type unknown  . Colon cancer Cousin   . Thyroid cancer Cousin   . Breast cancer Cousin     Objective: Office vital signs reviewed. BP 107/67   Pulse 66   Temp 97.8 F (36.6 C)   Ht 5' 4.5" (1.638 m)   Wt 152 lb 12.8 oz (69.3 kg)   SpO2 97%   BMI 25.82 kg/m   Physical Examination:  General: Awake, alert, well nourished, No acute distress HEENT: Normal; no goiter or exophthalmos; well-healed postsurgical scar noted on the left Cardio: regular rate and rhythm, S1S2 heard, no murmurs appreciated Pulm: clear to auscultation bilaterally, no wheezes, rhonchi or rales; normal work of breathing on room air Extremities: warm, well perfused, No edema, cyanosis or clubbing; +2 pulses bilaterally MSK: Mild arthritic changes noted in bilateral thumbs but no gross joint deformity, swollen joints or warm joints. Skin: Normal temperature Neuro: No tremor Psych: Mood stable.  Affect appropriate.  Speech normal.  Does not appear to be responding to internal stimuli. Depression screen [facility] 2/9 02/09/2020 04/07/2019 09/30/2018  Decreased Interest 0 0 0  Down, Depressed, Hopeless 0 1 0  PHQ - 2 Score 0 1 0  Altered sleeping 1 2 -  Tired, decreased energy 1 1 -  Change in appetite 0 0 -  Feeling bad or failure about yourself  0 0 -  Trouble concentrating 0 0 -  Moving slowly or fidgety/restless 0 0 -  Suicidal thoughts 0 0 -  PHQ-9 Score 2 4 -  Difficult doing work/chores Not difficult at all - -   GAD 7 : Generalized Anxiety Score 02/09/2020 04/07/2019  Nervous, Anxious, on Edge 1 3  Control/stop worrying 0 2  Worry too much - different things 1 2  Trouble relaxing 1  2  Restless 2 2  Easily annoyed or irritable 0 1  Afraid - awful might happen 0 0  Total GAD 7 Score 5 12  Anxiety Difficulty Not difficult at all Somewhat difficult   Assessment/ Plan: 54 y.o. female   1. Anxiety Overall this is stable with as needed use of alprazolam.  The national narcotic database was reviewed and there were no red flags.  UDS and CSA were updated as per office policy - ToxASSURE Select 13 (MW), Urine - ALPRAZolam (XANAX) 0.25 MG tablet; TAKE 0.5 to 1 TABLET(0.25 MG) BY MOUTH qhs AS NEEDED FOR SLEEP OR ANXIETY  Dispense: 30 tablet; Refill: 1  2. Controlled substance agreement signed - ToxASSURE Select 13 (MW), Urine  3. Hyperthyroidism I reviewed her last endocrinology note.  We will repeat thyroid panel, add nonfasting lipid panel, metabolic panel.  DEXA scan ordered and we will schedule as soon as possible - Thyroid Panel With TSH - Lipid Panel - CMP14+EGFR - DG [facility] DEXA; Future - VITAMIN D 25 Hydroxy (Vit-D Deficiency, Fractures)  4. Fatigue, unspecified type Uncertain etiology.  Check the above.  A1c normal. No diabetes - Bayer DCA Hb A1c Waived  5. History of parathyroidectomy - DG [facility] DEXA; Future - VITAMIN D 25 Hydroxy (Vit-D Deficiency, Fractures)  6. Polyarthralgia Given family history of rheumatoid arthritis, will evaluate for possible autoimmune disorder.  In the interim, may need to consider oral NSAID given intolerance to corticosteroids. - ANA w/Reflex if Positive - C-reactive protein - Sedimentation Rate - Rheumatoid factor  7. Family history of rheumatic joint disease - ANA w/Reflex if Positive - C-reactive protein - Sedimentation Rate - Rheumatoid factor  8. Other migraine without status migrainosus, not intractable  Stable - SUMAtriptan (IMITREX) 50 MG tablet; Take 1 tablet (50 mg total) by mouth daily as needed for Migraine (may repeat in 2 hours).  Dispense: 10 tablet; Refill: 3  9. Dysuria Evaluate for possible  infection - Urinalysis, Complete  10. Need for Tdap vaccination Administered during today's visit - Tdap vaccine greater than or equal to 7yo IM   No orders of the defined types were placed in this encounter.  No orders of the defined types were placed in this encounter.    Janora Norlander, DO [place] [phone]

## 2020-02-09 NOTE — Patient Instructions (Signed)
Schedule mammogram at checkout. I am glad to perform a full physical with pelvic (not necessarily a pap smear since you've had a partial hysterectomy).  Schedule this at checkout.  You had labs performed today.  You will be contacted with the results of the labs once they are available, usually in the next 3 business days for routine lab work.  If you have an active my chart account, they will be released to your MyChart.  If you prefer to have these labs released to you via telephone, please let us know.  If you had a pap smear or biopsy performed, expect to be contacted in about 7-10 days.

## 2020-02-10 LAB — CMP14+EGFR
ALT: 15 IU/L (ref 0–32)
AST: 20 IU/L (ref 0–40)
Albumin/Globulin Ratio: 2.3 — ABNORMAL HIGH (ref 1.2–2.2)
Albumin: 4.5 g/dL (ref 3.8–4.9)
Alkaline Phosphatase: 70 IU/L (ref 48–121)
BUN/Creatinine Ratio: 14 (ref 9–23)
BUN: 11 mg/dL (ref 6–24)
Bilirubin Total: 0.2 mg/dL (ref 0.0–1.2)
CO2: 24 mmol/L (ref 20–29)
Calcium: 9.5 mg/dL (ref 8.7–10.2)
Chloride: 104 mmol/L (ref 96–106)
Creatinine, Ser: 0.81 mg/dL (ref 0.57–1.00)
GFR calc Af Amer: 97 mL/min/{1.73_m2} (ref >59)
GFR calc non Af Amer: 84 mL/min/{1.73_m2} (ref >59)
Globulin, Total: 2 g/dL (ref 1.5–4.5)
Glucose: 86 mg/dL (ref 65–99)
Potassium: 4.2 mmol/L (ref 3.5–5.2)
Sodium: 140 mmol/L (ref 134–144)
Total Protein: 6.5 g/dL (ref 6.0–8.5)

## 2020-02-10 LAB — THYROID PANEL WITH TSH
Free Thyroxine Index: 1.5 (ref 1.2–4.9)
T3 Uptake Ratio: 28 % (ref 24–39)
T4, Total: 5.4 ug/dL (ref 4.5–12.0)
TSH: 0.361 u[IU]/mL — ABNORMAL LOW (ref 0.450–4.500)

## 2020-02-10 LAB — LIPID PANEL
Chol/HDL Ratio: 2.2 ratio (ref 0.0–4.4)
Cholesterol, Total: 203 mg/dL — ABNORMAL HIGH (ref 100–199)
HDL: 93 mg/dL (ref >39)
LDL Chol Calc (NIH): 97 mg/dL (ref 0–99)
Triglycerides: 75 mg/dL (ref 0–149)
VLDL Cholesterol Cal: 13 mg/dL (ref 5–40)

## 2020-02-10 LAB — RHEUMATOID FACTOR: Rheumatoid fact SerPl-aCnc: 10.2 IU/mL (ref 0.0–13.9)

## 2020-02-10 LAB — SEDIMENTATION RATE: Sed Rate: 14 mm/hr (ref 0–40)

## 2020-02-10 LAB — ANA W/REFLEX IF POSITIVE: Anti Nuclear Antibody (ANA): NEGATIVE

## 2020-02-10 LAB — VITAMIN D 25 HYDROXY (VIT D DEFICIENCY, FRACTURES): Vit D, 25-Hydroxy: 50.6 ng/mL (ref 30.0–100.0)

## 2020-02-10 LAB — C-REACTIVE PROTEIN: CRP: 2 mg/L (ref 0–10)

## 2020-02-11 LAB — TOXASSURE SELECT 13 (MW), URINE

## 2020-02-12 ENCOUNTER — Other Ambulatory Visit: Payer: Self-pay | Admitting: *Deleted

## 2020-02-12 DIAGNOSIS — E0590 Thyrotoxicosis, unspecified without thyrotoxic crisis or storm: Secondary | ICD-10-CM

## 2020-02-16 ENCOUNTER — Other Ambulatory Visit: Payer: Self-pay | Admitting: Family Medicine

## 2020-02-17 ENCOUNTER — Other Ambulatory Visit: Payer: Self-pay | Admitting: Family Medicine

## 2020-02-17 DIAGNOSIS — Z1231 Encounter for screening mammogram for malignant neoplasm of breast: Secondary | ICD-10-CM

## 2020-03-02 ENCOUNTER — Encounter: Payer: Self-pay | Admitting: Endocrinology

## 2020-03-02 ENCOUNTER — Ambulatory Visit (INDEPENDENT_AMBULATORY_CARE_PROVIDER_SITE_OTHER): Payer: BC Managed Care – PPO | Admitting: Endocrinology

## 2020-03-02 ENCOUNTER — Ambulatory Visit
Admission: RE | Admit: 2020-03-02 | Discharge: 2020-03-02 | Disposition: A | Discharge status: Home / Self Care | Payer: BC Managed Care – PPO | Source: Ambulatory Visit | Attending: Family Medicine | Admitting: Family Medicine

## 2020-03-02 ENCOUNTER — Other Ambulatory Visit: Payer: Self-pay

## 2020-03-02 VITALS — BP 120/80 | HR 75 | Ht 64.5 in | Wt 154.4 lb

## 2020-03-02 DIAGNOSIS — E0590 Thyrotoxicosis, unspecified without thyrotoxic crisis or storm: Secondary | ICD-10-CM

## 2020-03-02 DIAGNOSIS — Z1231 Encounter for screening mammogram for malignant neoplasm of breast: Secondary | ICD-10-CM

## 2020-03-02 LAB — TSH: TSH: 0.56 u[IU]/mL (ref 0.35–4.50)

## 2020-03-02 LAB — T4, FREE: Free T4: 0.78 ng/dL (ref 0.60–1.60)

## 2020-03-02 NOTE — Patient Instructions (Signed)
Blood tests are requested for you today.  We'll let you know about the results.  Please come back for a follow-up appointment in 3 months.   

## 2020-03-02 NOTE — Progress Notes (Signed)
Subjective:    Patient ID: Makayla Jackson, female    DOB: 1965-09-14, 54 y.o.   MRN: [id number]  HPI Pt returns for f/u of MNG (dx'ed 2013; TSH is slightly low; she has had multiple bxs and cyst drainage procedures; she had parathyroidectomy in 2017; last [place] in 2020 showed no change, and no f/u US was advised; tapazole was rx'ed in 2020).  She stopped tapazole 1-2 mos ago.  pt states she feels well in general, except for fatigue.   Past Medical History:  Diagnosis Date   Arthritis    Asthma    Bowel obstruction ([place]) 08/2016   Diverticulitis    Miscarriage    Multiple thyroid nodules    Osteopenia    Parathyroid tumor    Pneumonia    SBO (small bowel obstruction) ([place])    Thyroid disease     Past Surgical History:  Procedure Laterality Date   ABDOMINAL HYSTERECTOMY     COLON SURGERY  2014   Diverticulosis resection, after diverticulitis   FINE NEEDLE ASPIRATION     Thyroid Nodule   KNEE ARTHROSCOPY Right    NASAL SINUS SURGERY  1998   PARATHYROIDECTOMY  12/2014   PARTIAL HYSTERECTOMY  2010   TONSILLECTOMY      Social History   Socioeconomic History   Marital status: Married    Spouse name: Not on file   Number of children: 3   Years of education: Not on file   Highest education level: Not on file  Occupational History   Occupation: Product manager: [employer]  Tobacco Use   Smoking status: Never Smoker   Smokeless tobacco: Never Used  Scientific laboratory technician Use: Never used  Substance and Sexual Activity   Alcohol use: Yes    Alcohol/week: 1.0 standard drink    Types: 1 Glasses of wine per week    Comment: occasional glass of wine    Drug use: No   Sexual activity: Not on file  Other Topics Concern   Not on file  Social History Narrative   The patient is married.  Her husband is retiring as Occupational hygienist of [employer] after serving in [place].   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, no tobacco no drug use   Social Determinants of Radio broadcast assistant Strain:    Difficulty of Paying Living Expenses:   Food Insecurity:    Worried About Charity fundraiser in the Last Year:    Arboriculturist in the Last Year:   Transportation Needs:    Film/video editor (Medical):    Lack of Transportation (Non-Medical):   Physical Activity:    Days of Exercise per Week:    Minutes of Exercise per Session:   Stress:    Feeling of Stress :   Social Connections:    Frequency of Communication with Friends and Family:    Frequency of Social Gatherings with Friends and Family:    Attends Religious Services:    Active Member of Clubs or Organizations:    Attends Archivist Meetings:    Marital Status:   Intimate Partner Violence:    Fear of Current or Ex-Partner:    Emotionally Abused:    Physically Abused:    Sexually Abused:     Current Outpatient Medications on File Prior to Visit  Medication  Sig Dispense Refill   acetaminophen (TYLENOL) 325 MG tablet Take 650 mg by mouth every 6 (six) hours.     ALPRAZolam (XANAX) 0.25 MG tablet TAKE 0.5 to 1 TABLET(0.25 MG) BY MOUTH qhs AS NEEDED FOR SLEEP OR ANXIETY 30 tablet 1   Azelastine-Fluticasone 137-50 MCG/ACT SUSP 1 spray by Nasal route daily. In each nostril     cetirizine (ZYRTEC) 10 MG tablet Take 10 mg by mouth daily.     fluticasone (FLOVENT HFA) 110 MCG/ACT inhaler Inhale 2 puffs into the lungs 2 (two) times daily. 1 Inhaler 3   SUMAtriptan (IMITREX) 50 MG tablet Take 1 tablet (50 mg total) by mouth daily as needed for Migraine (may repeat in 2 hours). 10 tablet 3   No current facility-administered medications on file prior to visit.    Allergies  Allergen Reactions   Other Anaphylaxis and Hives    Bee stings   Shellfish Allergy Swelling   Penicillins Hives   Sulfa Antibiotics Hives   Prednisone     Pt stated, "It  made me feel slightly agitated. I could not sleep well"    Family History  Problem Relation Age of Onset   Arthritis Mother    Arthritis/Rheumatoid Mother    Asthma Mother    Heart disease Mother    Hypertension Mother    Stroke Mother    Vision loss Mother    Thyroid disease Mother    Colon polyps Mother    Thyroid cancer Mother    Arthritis Father    Heart disease Father    Hypertension Father    Colon polyps Father    Diverticulitis Father        with colon surgery   Arthritis/Rheumatoid Maternal Aunt    Arthritis/Rheumatoid Maternal Uncle    Diabetes Maternal Grandmother    Lung cancer Maternal Grandfather    Cancer Paternal Grandfather        type unknown   Colon cancer Cousin    Thyroid cancer Cousin    Breast cancer Cousin     BP 120/80    Pulse 75    Ht 5' 4.5" (1.638 m)    Wt 154 lb 6.4 oz (70 kg)    SpO2 99%    BMI 26.09 kg/m    Review of Systems     Objective:   Physical Exam VITAL SIGNS:  See vs page GENERAL: no distress NECK: There is no palpable thyroid enlargement.  No thyroid nodule is palpable.  No palpable lymphadenopathy at the anterior neck.    Lab Results  Component Value Date   TSH 0.56 03/02/2020   T4TOTAL 5.4 02/09/2020        Assessment & Plan:  Hyperthyroidism: better, but it will probably become abnormal again with time. You can hold off on the medication for now Please come back for a follow-up appointment in 3 months

## 2020-03-03 ENCOUNTER — Other Ambulatory Visit: Payer: Self-pay

## 2020-04-25 ENCOUNTER — Other Ambulatory Visit: Payer: Self-pay

## 2020-04-25 ENCOUNTER — Encounter: Payer: Self-pay | Admitting: Family Medicine

## 2020-04-25 ENCOUNTER — Ambulatory Visit (INDEPENDENT_AMBULATORY_CARE_PROVIDER_SITE_OTHER): Payer: BC Managed Care – PPO | Admitting: Family Medicine

## 2020-04-25 DIAGNOSIS — J0100 Acute maxillary sinusitis, unspecified: Secondary | ICD-10-CM | POA: Diagnosis not present

## 2020-04-25 MED ORDER — DOXYCYCLINE HYCLATE 100 MG PO TABS: 100.0000 mg | ORAL_TABLET | Freq: Two times a day (BID) | ORAL | 0 refills | Status: AC

## 2020-04-25 MED ORDER — PREDNISONE 10 MG (21) PO TBPK: ORAL_TABLET | ORAL | 0 refills | Status: DC

## 2020-04-25 NOTE — Progress Notes (Signed)
Virtual Visit via Telephone Note  I connected with Makayla Jackson on 04/25/20 at 1:42 PM by telephone and verified that I am speaking with the correct person using two identifiers. Makayla Jackson is currently located at home and nobody is currently with her during this visit. The provider, Loman Brooklyn, FNP is located in their office at time of visit.  I discussed the limitations, risks, security and privacy concerns of performing an evaluation and management service by telephone and the availability of in person appointments. I also discussed with the patient that there may be a patient responsible charge related to this service. The patient expressed understanding and agreed to proceed.  Subjective: PCP: Janora Norlander, DO  Chief Complaint  Patient presents with   URI   Patient complains of head congestion, headache, sneezing, sore throat, ear pain/pressure, facial pain/pressure, fever and postnasal drainage. Onset of symptoms was 3 days ago, gradually worsening since that time. She is drinking plenty of fluids. Evaluation to date: patient had a COVID-19 test this AM. Treatment to date: antihistamines and nasal steroids. She has a history of asthma. She does not smoke. She is fully vaccinated against COVID-19.    ROS: Per HPI  Current Outpatient Medications:    acetaminophen (TYLENOL) 325 MG tablet, Take 650 mg by mouth every 6 (six) hours., Disp: , Rfl:    ALPRAZolam (XANAX) 0.25 MG tablet, TAKE 0.5 to 1 TABLET(0.25 MG) BY MOUTH qhs AS NEEDED FOR SLEEP OR ANXIETY, Disp: 30 tablet, Rfl: 1   Azelastine-Fluticasone 137-50 MCG/ACT SUSP, 1 spray by Nasal route daily. In each nostril, Disp: , Rfl:    cetirizine (ZYRTEC) 10 MG tablet, Take 10 mg by mouth daily., Disp: , Rfl:    fluticasone (FLOVENT HFA) 110 MCG/ACT inhaler, Inhale 2 puffs into the lungs 2 (two) times daily., Disp: 1 Inhaler, Rfl: 3   SUMAtriptan (IMITREX) 50 MG tablet, Take 1 tablet (50 mg total) by mouth daily  as needed for Migraine (may repeat in 2 hours)., Disp: 10 tablet, Rfl: 3  Allergies  Allergen Reactions   Other Anaphylaxis and Hives    Bee stings   Shellfish Allergy Swelling   Penicillins Hives   Sulfa Antibiotics Hives   Prednisone     Pt stated, "It made me feel slightly agitated. I could not sleep well"   Past Medical History:  Diagnosis Date   Arthritis    Asthma    Bowel obstruction ([place]) 08/2016   Diverticulitis    Miscarriage    Multiple thyroid nodules    Osteopenia    Parathyroid tumor    Pneumonia    SBO (small bowel obstruction) ([facility])    Thyroid disease     Observations/Objective: A&O  No respiratory distress or wheezing audible over the phone Mood, judgement, and thought processes all WNL  Assessment and Plan: 1. Acute non-recurrent maxillary sinusitis - Discussed symptom management and symptoms that should prompt her to go straight to the ER. Patient is awaiting COVID-19 test results.  - predniSONE (STERAPRED UNI-PAK 21 TAB) 10 MG (21) TBPK tablet; As directed x 6 days  Dispense: 21 tablet; Refill: 0 - doxycycline (VIBRA-TABS) 100 MG tablet; Take 1 tablet (100 mg total) by mouth 2 (two) times daily for 7 days. 1 po bid  Dispense: 14 tablet; Refill: 0   Follow Up Instructions:  I discussed the assessment and treatment plan with the patient. The patient was provided an opportunity to ask questions and all were answered. The  patient agreed with the plan and demonstrated an understanding of the instructions.   The patient was advised to call back or seek an in-person evaluation if the symptoms worsen or if the condition fails to improve as anticipated.  The above assessment and management plan was discussed with the patient. The patient verbalized understanding of and has agreed to the management plan. Patient is aware to call the clinic if symptoms persist or worsen. Patient is aware when to return to the clinic for a follow-up visit. Patient  educated on when it is appropriate to go to the emergency department.   Time call ended: 1:53 PM  I provided 13 minutes of non-face-to-face time during this encounter.  Hendricks Limes, MSN, APRN, FNP-C [facility] 04/25/20

## 2020-06-08 ENCOUNTER — Ambulatory Visit: Payer: BC Managed Care – PPO | Admitting: Endocrinology

## 2020-06-09 ENCOUNTER — Ambulatory Visit (INDEPENDENT_AMBULATORY_CARE_PROVIDER_SITE_OTHER): Payer: BC Managed Care – PPO | Admitting: Nurse Practitioner

## 2020-06-09 DIAGNOSIS — J0101 Acute recurrent maxillary sinusitis: Secondary | ICD-10-CM | POA: Diagnosis not present

## 2020-06-09 MED ORDER — DOXYCYCLINE HYCLATE 100 MG PO TABS
100.0000 mg | ORAL_TABLET | Freq: Two times a day (BID) | ORAL | 0 refills | Status: DC
Start: 2020-06-09 — End: 2020-08-11

## 2020-06-09 NOTE — Progress Notes (Signed)
Virtual Visit via telephone Note Due to COVID-19 pandemic this visit was conducted virtually. This visit type was conducted due to national recommendations for restrictions regarding the COVID-19 Pandemic (e.g. social distancing, sheltering in place) in an effort to limit this patient's exposure and mitigate transmission in our community. All issues noted in this document were discussed and addressed.  A physical exam was not performed with this format.  I connected with Makayla Jackson on 06/09/20 at 12:10 by telephone and verified that I am speaking with the correct person using two identifiers. Makayla Jackson is currently located at home and none is currently with her during visit. The provider, Mary-Margaret Hassell Done, FNP is located in their office at time of visit.  I discussed the limitations, risks, security and privacy concerns of performing an evaluation and management service by telephone and the availability of in person appointments. I also discussed with the patient that there may be a patient responsible charge related to this service. The patient expressed understanding and agreed to proceed.   History and Present Illness:   Chief Complaint: Sinusitis   HPI Patient calls in today c/o lots of facial pressure, teeth her and lots of congestion. Started severel days ago. She has been using OTC meds without relief. Has low grade fever. Had covid test 2 days ago and was negative. She has been vaccinated.   Review of Systems  Constitutional: Positive for chills, fever and malaise/fatigue.  HENT: Positive for congestion and sinus pain. Negative for sore throat.   Respiratory: Positive for cough.   Musculoskeletal: Negative for myalgias.  Neurological: Positive for headaches. Negative for dizziness.     Observations/Objective: Alert and oriented- answers all questions appropriately No distress Voice hoarse  Assessment and Plan: [name] in today with chief complaint of  Sinusitis   1. Acute recurrent maxillary sinusitis 1. Take meds as prescribed 2. Use a cool mist humidifier especially during the winter months and when heat has been humid. 3. Use saline nose sprays frequently 4. Saline irrigations of the nose can be very helpful if done frequently.  * 4X daily for 1 week*  * Use of a nettie pot can be helpful with this. Follow directions with this* 5. Drink plenty of fluids 6. Keep thermostat turn down low 7.For any cough or congestion  Use plain Mucinex- regular strength or max strength is fine   * Children- consult with Pharmacist for dosing 8. For fever or aces or pains- take tylenol or ibuprofen appropriate for age and weight.  * for fevers greater than 101 orally you may alternate ibuprofen and tylenol every  3 hours.   Meds ordered this encounter  Medications  . doxycycline (VIBRA-TABS) 100 MG tablet    Sig: Take 1 tablet (100 mg total) by mouth 2 (two) times daily. 1 po bid    Dispense:  14 tablet    Refill:  0    Order Specific Question:   Supervising Provider    Answer:   Caryl Pina A [[id number]]        Follow Up Instructions: prn    I discussed the assessment and treatment plan with the patient. The patient was provided an opportunity to ask questions and all were answered. The patient agreed with the plan and demonstrated an understanding of the instructions.   The patient was advised to call back or seek an in-person evaluation if the symptoms worsen or if the condition fails to improve as anticipated.  The above  assessment and management plan was discussed with the patient. The patient verbalized understanding of and has agreed to the management plan. Patient is aware to call the clinic if symptoms persist or worsen. Patient is aware when to return to the clinic for a follow-up visit. Patient educated on when it is appropriate to go to the emergency department.   Time call ended:  1:07  I provided 17 minutes of  non-face-to-face time during this encounter.    Mary-Margaret Hassell Done, FNP

## 2020-06-16 ENCOUNTER — Ambulatory Visit (INDEPENDENT_AMBULATORY_CARE_PROVIDER_SITE_OTHER): Payer: BC Managed Care – PPO | Admitting: Endocrinology

## 2020-06-16 ENCOUNTER — Other Ambulatory Visit: Payer: Self-pay

## 2020-06-16 ENCOUNTER — Encounter: Payer: Self-pay | Admitting: Endocrinology

## 2020-06-16 VITALS — BP 118/72 | HR 76 | Ht 64.0 in | Wt 154.0 lb

## 2020-06-16 DIAGNOSIS — E0590 Thyrotoxicosis, unspecified without thyrotoxic crisis or storm: Secondary | ICD-10-CM

## 2020-06-16 NOTE — Progress Notes (Signed)
Subjective:    Patient ID: Makayla Jackson, female    DOB: [birth year], 54 y.o.   MRN: [id number]  HPI Pt returns for f/u of MNG (dx'ed 2013; TSH is slightly low; she has had multiple bxs and cyst drainage procedures; she had parathyroidectomy in 2017; last [place] in 2020 showed no change, and no f/u US was advised; tapazole was rx'ed in 2020; she decided to stop tapazole 6/21.  pt states she feels well in general.  Pt says if TSH is slightly low, she would like to defer rx for now.   Past Medical History:  Diagnosis Date  . Arthritis   . Asthma   . Bowel obstruction ([place]) 08/2016  . Diverticulitis   . Miscarriage   . Multiple thyroid nodules   . Osteopenia   . Parathyroid tumor   . Pneumonia   . SBO (small bowel obstruction) ([place])   . Thyroid disease     Past Surgical History:  Procedure Laterality Date  . ABDOMINAL HYSTERECTOMY    . COLON SURGERY  2014   Diverticulosis resection, after diverticulitis  . FINE NEEDLE ASPIRATION     Thyroid Nodule  . KNEE ARTHROSCOPY Right   . NASAL SINUS SURGERY  1998  . PARATHYROIDECTOMY  12/2014  . PARTIAL HYSTERECTOMY  2010  . TONSILLECTOMY      Social History   Socioeconomic History  . Marital status: Married    Spouse name: Not on file  . Number of children: 3  . Years of education: Not on file  . Highest education level: Not on file  Occupational History  . Occupation: Product manager: [employer]  Tobacco Use  . Smoking status: Never Smoker  . Smokeless tobacco: Never Used  Vaping Use  . Vaping Use: Never used  Substance and Sexual Activity  . Alcohol use: Yes    Alcohol/week: 1.0 standard drink    Types: 1 Glasses of wine per week    Comment: occasional glass of wine   . Drug use: No  . Sexual activity: Not on file  Other Topics Concern  . Not on file  Social History Narrative   The patient is married.  Her husband is retiring as Occupational hygienist of [employer] after serving in [place].    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 Difficulty of Paying Living Expenses: Not on file  Food Insecurity:   . Worried About Charity fundraiser in the Last Year: Not on file  . Ran Out of Food in the Last Year: Not on file  Transportation Needs:   . Lack of Transportation (Medical): Not on file  . Lack of Transportation (Non-Medical): Not on file  Physical Activity:   . Days of Exercise per Week: Not on file  . Minutes of Exercise per Session: Not on file  Stress:   . Feeling of Stress : Not on file  Social Connections:   . Frequency of Communication with Friends and Family: Not on file  . Frequency of Social Gatherings with Friends and Family: Not on file  . Attends Religious Services: Not on file  . Active Member of Clubs or Organizations: Not on file  . Attends Archivist Meetings: Not on file  . Marital Status: Not on file  Intimate Partner Violence:   . Fear of Current or Ex-Partner: Not on file  . Emotionally Abused: Not on file  . Physically Abused: Not on file  . Sexually Abused: Not on file    Current Outpatient Medications on File Prior to Visit  Medication Sig Dispense Refill  . acetaminophen (TYLENOL) 325 MG tablet Take 650 mg by mouth every 6 (six) hours.    . ALPRAZolam (XANAX) 0.25 MG tablet TAKE 0.5 to 1 TABLET(0.25 MG) BY MOUTH qhs AS NEEDED FOR SLEEP OR ANXIETY 30 tablet 1  . Azelastine-Fluticasone 137-50 MCG/ACT SUSP 1 spray by Nasal route daily. In each nostril    . cetirizine (ZYRTEC) 10 MG tablet Take 10 mg by mouth daily.    Marland Kitchen doxycycline (VIBRA-TABS) 100 MG tablet Take 1 tablet (100 mg total) by mouth 2 (two) times daily. 1 po bid 14 tablet 0  . fluticasone (FLOVENT HFA) 110 MCG/ACT inhaler Inhale 2 puffs into the lungs 2 (two) times daily. 1 Inhaler 3  . predniSONE (STERAPRED UNI-PAK 21 TAB) 10 MG  (21) TBPK tablet As directed x 6 days 21 tablet 0  . SUMAtriptan (IMITREX) 50 MG tablet Take 1 tablet (50 mg total) by mouth daily as needed for Migraine (may repeat in 2 hours). 10 tablet 3   No current facility-administered medications on file prior to visit.    Allergies  Allergen Reactions  . Other Anaphylaxis and Hives    Bee stings  . Shellfish Allergy Swelling  . Penicillins Hives  . Sulfa Antibiotics Hives  . Prednisone     Pt stated, "It made me feel slightly agitated. I could not sleep well"    Family History  Problem Relation Age of Onset  . Arthritis Mother   . Arthritis/Rheumatoid Mother   . Asthma Mother   . Heart disease Mother   . Hypertension Mother   . Stroke Mother   . Vision loss Mother   . Thyroid disease Mother   . Colon polyps Mother   . Thyroid cancer Mother   . Arthritis Father   . Heart disease Father   . Hypertension Father   . Colon polyps Father   . Diverticulitis Father        with colon surgery  . Arthritis/Rheumatoid Maternal Aunt   . Arthritis/Rheumatoid Maternal Uncle   . Diabetes Maternal Grandmother   . Lung cancer Maternal Grandfather   . Cancer Paternal Grandfather        type unknown  . Colon cancer Cousin   . Thyroid cancer Cousin   . Breast cancer Cousin     BP 118/72   Pulse 76   Ht 5\' 4"  (1.626 m)   Wt 154 lb (69.9 kg)   SpO2 98%   BMI 26.43 kg/m    Review of Systems     Objective:   Physical Exam VITAL SIGNS:  See vs page GENERAL: no distress NECK: Thyroid is enlarged, but I can't tell details.  No thyroid nodule is palpable.  No palpable lymphadenopathy at the anterior neck.    Lab Results  Component Value Date   PTH 28 10/01/2017   CALCIUM 9.5 02/09/2020   Lab Results  Component Value Date   TSH 0.29 (L) 06/16/2020   T4TOTAL 5.4 02/09/2020      Assessment & Plan:  Hyperthyroidism: uncontrolled.   I told pt we can hold off on the medication. I'll see you next time.

## 2020-06-16 NOTE — Patient Instructions (Addendum)
Blood tests are requested for you today.  We'll let you know about the results.   Please come back for a follow-up appointment in 4-5 months.

## 2020-06-17 LAB — T4, FREE: Free T4: 0.81 ng/dL (ref 0.60–1.60)

## 2020-06-17 LAB — TSH: TSH: 0.29 u[IU]/mL — ABNORMAL LOW (ref 0.35–4.50)

## 2020-06-18 ENCOUNTER — Telehealth: Payer: Self-pay | Admitting: Endocrinology

## 2020-06-18 NOTE — Telephone Encounter (Signed)
please contact patient: As we predicted, the thyroid is again slightly overactive. We can hold off on the medication. I'll see you next time.

## 2020-06-20 NOTE — Telephone Encounter (Signed)
Spoken to patient and notified Dr Cordelia Pen comments. Verbalized understanding.

## 2020-08-11 ENCOUNTER — Ambulatory Visit (INDEPENDENT_AMBULATORY_CARE_PROVIDER_SITE_OTHER): Payer: BC Managed Care – PPO | Admitting: Family Medicine

## 2020-08-11 ENCOUNTER — Other Ambulatory Visit: Payer: Self-pay

## 2020-08-11 ENCOUNTER — Other Ambulatory Visit: Payer: BC Managed Care – PPO

## 2020-08-11 ENCOUNTER — Encounter: Payer: Self-pay | Admitting: Family Medicine

## 2020-08-11 ENCOUNTER — Ambulatory Visit (INDEPENDENT_AMBULATORY_CARE_PROVIDER_SITE_OTHER): Payer: BC Managed Care – PPO

## 2020-08-11 VITALS — BP 116/80 | HR 80 | Ht 64.0 in | Wt 157.0 lb

## 2020-08-11 DIAGNOSIS — J4520 Mild intermittent asthma, uncomplicated: Secondary | ICD-10-CM

## 2020-08-11 DIAGNOSIS — E0590 Thyrotoxicosis, unspecified without thyrotoxic crisis or storm: Secondary | ICD-10-CM

## 2020-08-11 DIAGNOSIS — F419 Anxiety disorder, unspecified: Secondary | ICD-10-CM | POA: Diagnosis not present

## 2020-08-11 DIAGNOSIS — Z23 Encounter for immunization: Secondary | ICD-10-CM

## 2020-08-11 DIAGNOSIS — Z0001 Encounter for general adult medical examination with abnormal findings: Secondary | ICD-10-CM

## 2020-08-11 DIAGNOSIS — F439 Reaction to severe stress, unspecified: Secondary | ICD-10-CM

## 2020-08-11 DIAGNOSIS — G8929 Other chronic pain: Secondary | ICD-10-CM

## 2020-08-11 DIAGNOSIS — Z780 Asymptomatic menopausal state: Secondary | ICD-10-CM | POA: Diagnosis not present

## 2020-08-11 DIAGNOSIS — M79644 Pain in right finger(s): Secondary | ICD-10-CM

## 2020-08-11 DIAGNOSIS — M25511 Pain in right shoulder: Secondary | ICD-10-CM

## 2020-08-11 DIAGNOSIS — E892 Postprocedural hypoparathyroidism: Secondary | ICD-10-CM

## 2020-08-11 DIAGNOSIS — G43809 Other migraine, not intractable, without status migrainosus: Secondary | ICD-10-CM

## 2020-08-11 DIAGNOSIS — Z0000 Encounter for general adult medical examination without abnormal findings: Secondary | ICD-10-CM

## 2020-08-11 MED ORDER — NURTEC 75 MG PO TBDP: 1.0000 | ORAL_TABLET | Freq: Every day | ORAL | 0 refills | Status: DC | PRN

## 2020-08-11 MED ORDER — SUMATRIPTAN SUCCINATE 50 MG PO TABS: ORAL_TABLET | ORAL | 3 refills | Status: DC

## 2020-08-11 MED ORDER — FLUTICASONE PROPIONATE HFA 110 MCG/ACT IN AERO: INHALATION_SPRAY | RESPIRATORY_TRACT | 3 refills | Status: DC

## 2020-08-11 MED ORDER — ALBUTEROL SULFATE HFA 108 (90 BASE) MCG/ACT IN AERS: 2.0000 | INHALATION_SPRAY | Freq: Four times a day (QID) | RESPIRATORY_TRACT | 0 refills | Status: DC | PRN

## 2020-08-11 MED ORDER — ALPRAZOLAM 0.25 MG PO TABS: ORAL_TABLET | ORAL | 1 refills | Status: DC

## 2020-08-11 MED ORDER — DICLOFENAC SODIUM 75 MG PO TBEC: 75.0000 mg | DELAYED_RELEASE_TABLET | Freq: Two times a day (BID) | ORAL | 0 refills | Status: DC

## 2020-08-11 NOTE — Progress Notes (Signed)
Makayla Jackson is a 55 y.o. female presents to office today for annual physical exam examination.    Concerns today include: 1. right shoulder pain Patient has ongoing right shoulder pain that is waking her up from sleep.  She reports decreased strength in that arm.  She has been seeing a chiropractor and it has helped some but it has not fully resolved and she continues to have difficulty with elevation of the right upper extremity.  She has not seen an orthopedist for this but has seen orthopedics at [facility] in the past for knee issues.  She would be interested in seeing one of them again.  Currently only taking Tylenol or the rare Motrin.  2. thumb pain Patient reports pain at the base of bilateral thumb joints.  The symptoms have been present for a while now.  Her right is more affected than her left and she is right-hand dominant.  She feels like she has decreased grip strength.  Again Tylenol or Motrin as above.  Reports no sensory changes or preceding injury.  3. anxiety disorder Patient reports ongoing use of as needed Xanax at bedtime for sleep and/or anxiety.  She continues to have quite a bit of stress at home with her husband, who is an alcoholic.  She feels that his alcoholism is getting worse and is impacting their relationship quite a bit.  They have not been intimate in years which weighs on her.  4. migraine headaches Patient does use Imitrex intermittently for migraine headaches.  She has never tried Nurtec but would be interested.  5. hypothyroidism She was seen by endocrinology recently.  No new medications but did advise her to get her DEXA scan.  If she is found to have any bone loss they will start pharmacologic intervention  Marital status: married, Substance use: none Diet: fair, Exercise: active Last eye exam: UTD Last dental exam: UTD Last colonoscopy: UTD Last mammogram: UTD Last pap smear: UTD Refills needed today: Xanax Immunizations needed: Flu Vaccine:  Yes  Past Medical History:  Diagnosis Date  . Arthritis   . Asthma   . Bowel obstruction ([place]) 08/2016  . Diverticulitis   . Miscarriage   . Multiple thyroid nodules   . Osteopenia   . Parathyroid tumor   . Pneumonia   . SBO (small bowel obstruction) ([place])   . Thyroid disease    Social History   Socioeconomic History  . Marital status: Married    Spouse name: Not on file  . Number of children: 3  . Years of education: Not on file  . Highest education level: Not on file  Occupational History  . Occupation: Product manager: [employer]  Tobacco Use  . Smoking status: Never Smoker  . Smokeless tobacco: Never Used  Vaping Use  . Vaping Use: Never used  Substance and Sexual Activity  . Alcohol use: Yes    Alcohol/week: 1.0 standard drink    Types: 1 Glasses of wine per week    Comment: occasional glass of wine   . Drug use: No  . Sexual activity: Not on file  Other Topics Concern  . Not on file  Social History Narrative   The patient is married.  Her husband is retiring as Occupational hygienist of [employer] after serving in [place].   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 ABDOMINAL HYSTERECTOMY    . COLON SURGERY  2014   Diverticulosis resection, after diverticulitis  . FINE NEEDLE ASPIRATION     Thyroid Nodule  . KNEE ARTHROSCOPY Right   . NASAL SINUS SURGERY  1998  . PARATHYROIDECTOMY  12/2014  . PARTIAL HYSTERECTOMY  2010  . TONSILLECTOMY     Family History  Problem Relation Age of Onset  . Arthritis Mother   . Arthritis/Rheumatoid  Mother   . Asthma Mother   . Heart disease Mother   . Hypertension Mother   . Stroke Mother   . Vision loss Mother   . Thyroid disease Mother   . Colon polyps Mother   . Thyroid cancer Mother   . Arthritis Father   . Heart disease Father   . Hypertension Father   . Colon polyps Father   . Diverticulitis Father        with colon surgery  . Arthritis/Rheumatoid Maternal Aunt   . Arthritis/Rheumatoid Maternal Uncle   . Diabetes Maternal Grandmother   . Lung cancer Maternal Grandfather   . Cancer Paternal Grandfather        type unknown  . Colon cancer Cousin   . Thyroid cancer Cousin   . Breast cancer Cousin     Current Outpatient Medications:  .  acetaminophen (TYLENOL) 325 MG tablet, Take 650 mg by mouth every 6 (six) hours., Disp: , Rfl:  .  ALPRAZolam (XANAX) 0.25 MG tablet, TAKE 0.5 to 1 TABLET(0.25 MG) BY MOUTH qhs AS NEEDED FOR SLEEP OR ANXIETY, Disp: 30 tablet, Rfl: 1 .  Azelastine-Fluticasone 137-50 MCG/ACT SUSP, 1 spray by Nasal route daily. In each nostril, Disp: , Rfl:  .  cetirizine (ZYRTEC) 10 MG tablet, Take 10 mg by mouth daily., Disp: , Rfl:  .  doxycycline (VIBRA-TABS) 100 MG tablet, Take 1 tablet (100 mg total) by mouth 2 (two) times daily. 1 po bid, Disp: 14 tablet, Rfl: 0 .  fluticasone (FLOVENT HFA) 110 MCG/ACT inhaler, Inhale 2 puffs into the lungs 2 (two) times daily., Disp: 1 Inhaler, Rfl: 3 .  predniSONE (STERAPRED UNI-PAK 21 TAB) 10 MG (21) TBPK tablet, As directed x 6 days, Disp: 21 tablet, Rfl: 0 .  SUMAtriptan (IMITREX) 50 MG tablet, Take 1 tablet (50 mg total) by mouth daily as needed for Migraine (may repeat in 2 hours)., Disp: 10 tablet, Rfl: 3  Allergies  Allergen Reactions  . Other Anaphylaxis and Hives    Bee stings  . Shellfish Allergy Swelling  . Penicillins Hives  . Sulfa Antibiotics Hives  . Prednisone     Pt stated, "It made me feel slightly agitated. I could not sleep well"     ROS: Review of Systems Pertinent items noted in HPI  and remainder of comprehensive ROS otherwise negative.    Physical exam BP 116/80   Pulse 80   Ht 5\' 4"  (1.626 m)   Wt 157 lb (71.2 kg)   SpO2 96%   BMI 26.95 kg/m  General appearance: alert, cooperative, appears stated age and no distress Head: Normocephalic, without obvious abnormality, atraumatic Eyes: negative findings: lids and lashes normal, conjunctivae and sclerae normal, corneas  clear and pupils equal, round, reactive to light and accomodation Ears: normal TM's and external ear canals both ears Nose: Nares normal. Septum midline. Mucosa normal. No drainage or sinus tenderness. Throat: lips, mucosa, and tongue normal; teeth and gums normal Neck: no adenopathy, no carotid bruit, supple, symmetrical, trachea midline and thyroid not enlarged, symmetric, no tenderness/mass/nodules Back: symmetric, no curvature. ROM normal. No CVA tenderness. Lungs: clear to auscultation bilaterally Heart: regular rate and rhythm, S1, S2 normal, no murmur, click, rub or gallop Abdomen: soft, non-tender; bowel sounds normal; no masses,  no organomegaly Extremities: extremities normal, atraumatic, no cyanosis or edema Pulses: 2+ and symmetric Skin: Skin color, texture, turgor normal. No rashes or lesions Lymph nodes: Cervical, supraclavicular, and axillary nodes normal. Neurologic: Alert and oriented X 3, normal strength and tone. Normal symmetric reflexes. Normal coordination and gait MSK:   Right shoulder: She has limited active range of motion AB duction, internal rotation and external rotation.  Painful arc sign present.  Hands: No gross joint swelling, erythema or warmth.  Pain is located at the MCP bilaterally but is not reproducible on exam Psych: Mood is stable, speech is normal, affect is appropriate  Depression screen [facility] 2/9 08/11/2020 08/11/2020 08/11/2020  Decreased Interest 0 0 0  Down, Depressed, Hopeless 0 0 0  PHQ - 2 Score 0 0 0  Altered sleeping 2 2 -  Tired, decreased energy 1 1 -   Change in appetite - - -  Feeling bad or failure about yourself  0 0 -  Trouble concentrating 0 0 -  Moving slowly or fidgety/restless 0 0 -  Suicidal thoughts 0 0 -  PHQ-9 Score 3 3 -  Difficult doing work/chores - - -   GAD 7 : Generalized Anxiety Score 08/11/2020 08/11/2020 02/09/2020 04/07/2019  Nervous, Anxious, on Edge 1 1 1 3   Control/stop worrying 0 0 0 2  Worry too much - different things 0 0 1 2  Trouble relaxing 0 0 1 2  Restless 0 0 2 2  Easily annoyed or irritable 1 1 0 1  Afraid - awful might happen 0 0 0 0  Total GAD 7 Score 2 2 5 12   Anxiety Difficulty - - Not difficult at all Somewhat difficult   Assessment/ Plan: Makayla Jackson here for annual physical exam.   Annual physical exam  Hyperthyroidism  Anxiety - Plan: ALPRAZolam (XANAX) 0.25 MG tablet  Stress at home  Mild intermittent reactive airway disease without complication - Plan: fluticasone (FLOVENT HFA) 110 MCG/ACT inhaler  Other migraine without status migrainosus, not intractable - Plan: SUMAtriptan (IMITREX) 50 MG tablet, Rimegepant Sulfate (NURTEC) 75 MG TBDP  Chronic right shoulder pain - Plan: diclofenac (VOLTAREN) 75 MG EC tablet, Ambulatory referral to Orthopedic Surgery  Pain of right thumb - Plan: diclofenac (VOLTAREN) 75 MG EC tablet, Ambulatory referral to Orthopedic Surgery  Need for immunization against influenza - Plan: Flu Vaccine QUAD 36+ mos IM  She is up-to-date on preventative health care.  DEXA will be obtained today.  If she is found to have any bone loss we will alert her endocrinologist for intervention  I have given her a sample of Nurtec.  I would like to have her try this out.  It has a lower stroke risk and the Imitrex does.  For her shoulder and thumb pain, I have given her diclofenac to use up to twice daily as needed pain.  Avoid other NSAIDs.  Okay to use Tylenol, heat or ice if  needed.  Referral to orthopedics has been placed since her shoulder it does not seem to be  responding to conservative measures.  We discussed that she may benefit from corticosteroid injection.  She continues to have quite a bit of stress at home.  Her medications have been renewed.  The national narcotic database was reviewed and there were no red flags.  Continues to use Xanax sparingly  Additionally, influenza vaccination was administered  Counseled on healthy lifestyle choices, including diet (rich in fruits, vegetables and lean meats and low in salt and simple carbohydrates) and exercise (at least 30 minutes of moderate physical activity daily).  Patient to follow up in 1 year for annual exam or sooner if needed.  Allysha Tryon M. Lajuana Ripple, DO

## 2020-08-11 NOTE — Patient Instructions (Signed)
Use Diclofenac only if needed for pain/ inflammation. Take with food  You have prescribed a nonsteroidal anti-inflammatory drug (NSAID) today. This will help with your pain and inflammation. Please do not take any other NSAIDs (ibuprofen/Motrin/Advil, naproxen/Aleve, meloxicam/Mobic, Voltaren/diclofenac). Please make sure to eat a meal when taking this medication.   Caution:  If you have a history of acid reflux/indigestion, I recommend that you take an antacid (such as Prilosec, Prevacid) daily while on the NSAID.  If you have a history of bleeding disorder, gastric ulcer, are on a blood thinner (like warfarin/Coumadin, Xarelto, Eliquis, etc) please do not take NSAID.  If you have ever had a heart attack, you should not take NSAIDs.   Health Maintenance, Female Adopting a healthy lifestyle and getting preventive care are important in promoting health and wellness. Ask your health care provider about:  The right schedule for you to have regular tests and exams.  Things you can do on your own to prevent diseases and keep yourself healthy. What should I know about diet, weight, and exercise? Eat a healthy diet   Eat a diet that includes plenty of vegetables, fruits, low-fat dairy products, and lean protein.  Do not eat a lot of foods that are high in solid fats, added sugars, or sodium. Maintain a healthy weight Body mass index (BMI) is used to identify weight problems. It estimates body fat based on height and weight. Your health care provider can help determine your BMI and help you achieve or maintain a healthy weight. Get regular exercise Get regular exercise. This is one of the most important things you can do for your health. Most adults should:  Exercise for at least 150 minutes each week. The exercise should increase your heart rate and make you sweat (moderate-intensity exercise).  Do strengthening exercises at least twice a week. This is in addition to the moderate-intensity  exercise.  Spend less time sitting. Even light physical activity can be beneficial. Watch cholesterol and blood lipids Have your blood tested for lipids and cholesterol at 54 years of age, then have this test every 5 years. Have your cholesterol levels checked more often if:  Your lipid or cholesterol levels are high.  You are older than 54 years of age.  You are at high risk for heart disease. What should I know about cancer screening? Depending on your health history and family history, you may need to have cancer screening at various ages. This may include screening for:  Breast cancer.  Cervical cancer.  Colorectal cancer.  Skin cancer.  Lung cancer. What should I know about heart disease, diabetes, and high blood pressure? Blood pressure and heart disease  High blood pressure causes heart disease and increases the risk of stroke. This is more likely to develop in people who have high blood pressure readings, are of African descent, or are overweight.  Have your blood pressure checked: ? Every 3-5 years if you are 78-6 years of age. ? Every year if you are 76 years old or older. Diabetes Have regular diabetes screenings. This checks your fasting blood sugar level. Have the screening done:  Once every three years after age 100 if you are at a normal weight and have a low risk for diabetes.  More often and at a younger age if you are overweight or have a high risk for diabetes. What should I know about preventing infection? Hepatitis B If you have a higher risk for hepatitis B, you should be screened for this  virus. Talk with your health care provider to find out if you are at risk for hepatitis B infection. Hepatitis C Testing is recommended for:  Everyone born from [birth year] through 1964.  Anyone with known risk factors for hepatitis C. Sexually transmitted infections (STIs)  Get screened for STIs, including gonorrhea and chlamydia, if: ? You are sexually active and  are younger than 54 years of age. ? You are older than 54 years of age and your health care provider tells you that you are at risk for this type of infection. ? Your sexual activity has changed since you were last screened, and you are at increased risk for chlamydia or gonorrhea. Ask your health care provider if you are at risk.  Ask your health care provider about whether you are at high risk for HIV. Your health care provider may recommend a prescription medicine to help prevent HIV infection. If you choose to take medicine to prevent HIV, you should first get tested for HIV. You should then be tested every 3 months for as long as you are taking the medicine. Pregnancy  If you are about to stop having your period (premenopausal) and you may become pregnant, seek counseling before you get pregnant.  Take 400 to 800 micrograms (mcg) of folic acid every day if you become pregnant.  Ask for birth control (contraception) if you want to prevent pregnancy. Osteoporosis and menopause Osteoporosis is a disease in which the bones lose minerals and strength with aging. This can result in bone fractures. If you are 23 years old or older, or if you are at risk for osteoporosis and fractures, ask your health care provider if you should:  Be screened for bone loss.  Take a calcium or vitamin D supplement to lower your risk of fractures.  Be given hormone replacement therapy (HRT) to treat symptoms of menopause. Follow these instructions at home: Lifestyle  Do not use any products that contain nicotine or tobacco, such as cigarettes, e-cigarettes, and chewing tobacco. If you need help quitting, ask your health care provider.  Do not use street drugs.  Do not share needles.  Ask your health care provider for help if you need support or information about quitting drugs. Alcohol use  Do not drink alcohol if: ? Your health care provider tells you not to drink. ? You are pregnant, may be pregnant,  or are planning to become pregnant.  If you drink alcohol: ? Limit how much you use to 0-1 drink a day. ? Limit intake if you are breastfeeding.  Be aware of how much alcohol is in your drink. In the [place] one drink equals one 12 oz bottle of beer (355 mL), one 5 oz glass of wine (148 mL), or one 1 oz glass of hard liquor (44 mL). General instructions  Schedule regular health, dental, and eye exams.  Stay current with your vaccines.  Tell your health care provider if: ? You often feel depressed. ? You have ever been abused or do not feel safe at home. Summary  Adopting a healthy lifestyle and getting preventive care are important in promoting health and wellness.  Follow your health care provider's instructions about healthy diet, exercising, and getting tested or screened for diseases.  Follow your health care provider's instructions on monitoring your cholesterol and blood pressure. This information is not intended to replace advice given to you by your health care provider. Make sure you discuss any questions you have with your health care provider. Document  Revised: 07/16/2018 Document Reviewed: 07/16/2018 Elsevier Patient Education  [place].

## 2020-08-12 ENCOUNTER — Encounter: Payer: Self-pay | Admitting: Family Medicine

## 2020-09-21 ENCOUNTER — Ambulatory Visit (INDEPENDENT_AMBULATORY_CARE_PROVIDER_SITE_OTHER): Payer: BC Managed Care – PPO | Admitting: Family Medicine

## 2020-09-21 ENCOUNTER — Telehealth: Payer: BC Managed Care – PPO

## 2020-09-21 DIAGNOSIS — B001 Herpesviral vesicular dermatitis: Secondary | ICD-10-CM | POA: Diagnosis not present

## 2020-09-21 DIAGNOSIS — J019 Acute sinusitis, unspecified: Secondary | ICD-10-CM

## 2020-09-21 DIAGNOSIS — U099 Post covid-19 condition, unspecified: Secondary | ICD-10-CM | POA: Diagnosis not present

## 2020-09-21 DIAGNOSIS — B9689 Other specified bacterial agents as the cause of diseases classified elsewhere: Secondary | ICD-10-CM

## 2020-09-21 MED ORDER — CEFDINIR 300 MG PO CAPS: 300.0000 mg | ORAL_CAPSULE | Freq: Two times a day (BID) | ORAL | 0 refills | Status: DC

## 2020-09-21 MED ORDER — VALACYCLOVIR HCL 1 G PO TABS: 2000.0000 mg | ORAL_TABLET | Freq: Two times a day (BID) | ORAL | 0 refills | Status: DC

## 2020-09-21 NOTE — Progress Notes (Signed)
Telephone visit  Subjective: CC: ear pain PCP: Makayla Norlander, DO [id number] [name] is a 55 y.o. female calls for telephone consult today. Patient provides verbal consent for consult held via phone.  Due to COVID-19 pandemic this visit was conducted virtually. This visit type was conducted due to national recommendations for restrictions regarding the COVID-19 Pandemic (e.g. social distancing, sheltering in place) in an effort to limit this patient's exposure and mitigate transmission in our community. All issues noted in this document were discussed and addressed.  A physical exam was not performed with this format.   Location of patient: home Location of provider: [facility] Others present for call: none  1. URI Patient was positive for COVID about 3.5 weeks ago.  She had mild symptoms (head congestion, wheeze, fatigue).  She has had COVID vaccination.  She had several exposures at school.  She has ongoing sinusitis with thick opaque nasal discharge. She isolated at home for about a week and has gone back to work (except today).  No fevers, shortness of breath, hemoptysis.     ROS: Per HPI  Allergies  Allergen Reactions  . Other Anaphylaxis and Hives    Bee stings  . Shellfish Allergy Swelling  . Penicillins Hives  . Sulfa Antibiotics Hives  . Prednisone     Pt stated, "It made me feel slightly agitated. I could not sleep well"   Past Medical History:  Diagnosis Date  . Arthritis   . Asthma   . Bowel obstruction ([place]) 08/2016  . Diverticulitis   . Miscarriage   . Multiple thyroid nodules   . Osteopenia   . Parathyroid tumor   . Pneumonia   . SBO (small bowel obstruction) ([place])   . Thyroid disease     Current Outpatient Medications:  .  acetaminophen (TYLENOL) 325 MG tablet, Take 650 mg by mouth every 6 (six) hours., Disp: , Rfl:  .  albuterol (VENTOLIN HFA) 108 (90 Base) MCG/ACT inhaler, Inhale 2 puffs into the lungs every 6 (six) hours as needed for wheezing or  shortness of breath., Disp: 8 g, Rfl: 0 .  ALPRAZolam (XANAX) 0.25 MG tablet, TAKE 0.5 to 1 TABLET(0.25 MG) BY MOUTH qhs AS NEEDED FOR SLEEP OR ANXIETY, Disp: 30 tablet, Rfl: 1 .  Azelastine-Fluticasone 137-50 MCG/ACT SUSP, 1 spray by Nasal route daily. In each nostril, Disp: , Rfl:  .  cetirizine (ZYRTEC) 10 MG tablet, Take 10 mg by mouth daily., Disp: , Rfl:  .  diclofenac (VOLTAREN) 75 MG EC tablet, Take 1 tablet (75 mg total) by mouth 2 (two) times daily., Disp: 30 tablet, Rfl: 0 .  fluticasone (FLOVENT HFA) 110 MCG/ACT inhaler, Inhale 2 puffs into the lungs 2 (two) times daily., Disp: 1 each, Rfl: 3 .  Rimegepant Sulfate (NURTEC) 75 MG TBDP, Take 1 tablet by mouth daily as needed (migraine)., Disp: 4 tablet, Rfl: 0 .  SUMAtriptan (IMITREX) 50 MG tablet, Take 1 tablet (50 mg total) by mouth daily as needed for Migraine (may repeat in 2 hours)., Disp: 10 tablet, Rfl: 3  Assessment/ Plan: 55 y.o. female   Acute bacterial sinusitis - Plan: cefdinir (OMNICEF) 300 MG capsule  Fever blister - Plan: valACYclovir (VALTREX) 1000 MG tablet  Post-COVID syndrome  Empiric antibiotics sent given worsening of symptoms and opaque nasal discharge.  Continue probiotic to prevent yeast infection  Valtrex sent and instructions for use given for fever blister treatment  I suspect that ongoing fatigue is post Covid syndrome.  This may take  time to gradually improve.  We will continue to monitor her.  Start time: 10:00am End time: 10:11am  Total time spent on patient care (including telephone call/ virtual visit): 11 minutes  [place], [place] [phone]

## 2020-10-12 ENCOUNTER — Other Ambulatory Visit: Payer: Self-pay

## 2020-10-14 ENCOUNTER — Ambulatory Visit (INDEPENDENT_AMBULATORY_CARE_PROVIDER_SITE_OTHER): Payer: BC Managed Care – PPO | Admitting: Endocrinology

## 2020-10-14 ENCOUNTER — Other Ambulatory Visit: Payer: Self-pay

## 2020-10-14 VITALS — BP 116/76 | HR 89 | Ht 64.0 in | Wt 155.6 lb

## 2020-10-14 DIAGNOSIS — E0590 Thyrotoxicosis, unspecified without thyrotoxic crisis or storm: Secondary | ICD-10-CM

## 2020-10-14 NOTE — Patient Instructions (Addendum)
Blood tests are requested for you today.  We'll let you know about the results.   most of the time, a "lumpy thyroid" will eventually become more overactive.  this is usually a slow process, happening over the span of many years. let's check a thyroid "scan" (a special, but easy and painless type of thyroid x ray).  It works like this: you go to the x-ray department of the hospital to swallow a pill, which contains a miniscule amount of radiation.  You will not notice any symptoms from this.  You will go back to the x-ray department the next day, to lie down in front of a camera.  The results of this will be sent to me.   Based on the results, i hope to order for you a treatment pill of radioactive iodine.  Although it is a larger amount of radiation, you will again notice no symptoms from this.  The pill is gone from your body in a few days (during which you should stay away from other people), but takes several months to work.  Therefore, please return here approximately 6-8 weeks after the treatment.  This treatment has been available for many years, and the only known side-effect is an underactive thyroid.  It is possible that i would eventually prescribe for you a thyroid hormone pill, which is very inexpensive.  You don't have to worry about side-effects of this thyroid hormone pill, because it is the same molecule your thyroid makes.

## 2020-10-14 NOTE — Progress Notes (Signed)
Subjective:    Patient ID: Makayla Jackson, female    DOB: [birth year], 55 y.o.   MRN: [id number]  HPI Pt returns for f/u of MNG (dx'ed 2013; TSH is slightly low; she has had multiple bxs and cyst drainage procedures; she had parathyroidectomy in 2017; last [place] in 2020 showed no change, and no f/u US was advised; tapazole was rx'ed in 2020; she decided to stop tapazole 6/21; she has osteopenia; Pt says if TSH is slightly low, she would like to defer rx for now).  pt states she feels well in general.    Past Medical History:  Diagnosis Date  . Arthritis   . Asthma   . Bowel obstruction ([place]) 08/2016  . Diverticulitis   . Miscarriage   . Multiple thyroid nodules   . Osteopenia   . Parathyroid tumor   . Pneumonia   . SBO (small bowel obstruction) ([place])   . Thyroid disease     Past Surgical History:  Procedure Laterality Date  . ABDOMINAL HYSTERECTOMY    . COLON SURGERY  2014   Diverticulosis resection, after diverticulitis  . FINE NEEDLE ASPIRATION     Thyroid Nodule  . KNEE ARTHROSCOPY Right   . NASAL SINUS SURGERY  1998  . PARATHYROIDECTOMY  12/2014  . PARTIAL HYSTERECTOMY  2010  . TONSILLECTOMY      Social History   Socioeconomic History  . Marital status: Married    Spouse name: Not on file  . Number of children: 3  . Years of education: Not on file  . Highest education level: Not on file  Occupational History  . Occupation: Product manager: [employer]  Tobacco Use  . Smoking status: Never Smoker  . Smokeless tobacco: Never Used  Vaping Use  . Vaping Use: Never used  Substance and Sexual Activity  . Alcohol use: Yes    Alcohol/week: 1.0 standard drink    Types: 1 Glasses of wine per week    Comment: occasional glass of wine   . Drug use: No  . Sexual activity: Not on file  Other Topics Concern  . Not on file  Social History Narrative   The patient is married.  Her husband is retiring as Occupational hygienist of [employer] after  serving in [place].   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[place] 2 glasses of wine daily, no tobacco no drug use   Social Determinants of Radio broadcast assistant Strain: Not on file  Food Insecurity: Not on file  Transportation Needs: Not on file  Physical Activity: Not on file  Stress: Not on file  Social Connections: Not on file  Intimate Partner Violence: Not on file    Current Outpatient Medications on File Prior to Visit  Medication Sig Dispense Refill  . acetaminophen (TYLENOL) 325 MG tablet Take 650 mg by mouth every 6 (six) hours.    Marland Kitchen albuterol (VENTOLIN HFA) 108 (90 Base) MCG/ACT inhaler Inhale 2 puffs into the lungs every 6 (six) hours as needed for wheezing or shortness of breath. 8 g 0  . ALPRAZolam (XANAX) 0.25 MG tablet TAKE 0.5 to 1 TABLET(0.25 MG) BY MOUTH qhs AS NEEDED FOR SLEEP OR ANXIETY 30 tablet 1  . Azelastine-Fluticasone 137-50 MCG/ACT SUSP 1 spray by Nasal route daily. In each nostril    . cefdinir (OMNICEF) 300 MG capsule Take 1 capsule (300 mg total) by mouth 2 (two) times  daily. 1 po BID 20 capsule 0  . cetirizine (ZYRTEC) 10 MG tablet Take 10 mg by mouth daily.    . diclofenac (VOLTAREN) 75 MG EC tablet Take 1 tablet (75 mg total) by mouth 2 (two) times daily. 30 tablet 0  . fluticasone (FLOVENT HFA) 110 MCG/ACT inhaler Inhale 2 puffs into the lungs 2 (two) times daily. 1 each 3  . Rimegepant Sulfate (NURTEC) 75 MG TBDP Take 1 tablet by mouth daily as needed (migraine). 4 tablet 0  . SUMAtriptan (IMITREX) 50 MG tablet Take 1 tablet (50 mg total) by mouth daily as needed for Migraine (may repeat in 2 hours). 10 tablet 3  . valACYclovir (VALTREX) 1000 MG tablet Take 2 tablets (2,000 mg total) by mouth 2 (two) times daily. x1 day per flare. Repeat as needed for each fever blister flare. 20 tablet 0   No current facility-administered medications on file prior to visit.    Allergies  Allergen Reactions  . Other Anaphylaxis and  Hives    Bee stings  . Shellfish Allergy Swelling  . Penicillins Hives  . Sulfa Antibiotics Hives  . Prednisone     Pt stated, "It made me feel slightly agitated. I could not sleep well"    Family History  Problem Relation Age of Onset  . Arthritis Mother   . Arthritis/Rheumatoid Mother   . Asthma Mother   . Heart disease Mother   . Hypertension Mother   . Stroke Mother   . Vision loss Mother   . Thyroid disease Mother   . Colon polyps Mother   . Thyroid cancer Mother   . Arthritis Father   . Heart disease Father   . Hypertension Father   . Colon polyps Father   . Diverticulitis Father        with colon surgery  . Arthritis/Rheumatoid Maternal Aunt   . Arthritis/Rheumatoid Maternal Uncle   . Diabetes Maternal Grandmother   . Lung cancer Maternal Grandfather   . Cancer Paternal Grandfather        type unknown  . Colon cancer Cousin   . Thyroid cancer Cousin   . Breast cancer Cousin     BP 116/76 (BP Location: Right Arm, Patient Position: Sitting, Cuff Size: Normal)   Pulse 89   Ht 5\' 4"  (1.626 m)   Wt 155 lb 9.6 oz (70.6 kg)   SpO2 96%   BMI 26.71 kg/m    Review of Systems     Objective:   Physical Exam VITAL SIGNS:  See vs page GENERAL: no distress NECK: Thyroid is enlarged, but I can't tell details.  No thyroid nodule is palpable.  No palpable lymphadenopathy at the anterior neck.        Assessment & Plan:  MNG, with hyperthyroidism: uncontrolled.   Patient Instructions  Blood tests are requested for you today.  We'll let you know about the results.   most of the time, a "lumpy thyroid" will eventually become more overactive.  this is usually a slow process, happening over the span of many years. let's check a thyroid "scan" (a special, but easy and painless type of thyroid x ray).  It works like this: you go to the x-ray department of the hospital to swallow a pill, which contains a miniscule amount of radiation.  You will not notice any symptoms from  this.  You will go back to the x-ray department the next day, to lie down in front of a camera.  The results of  this will be sent to me.   Based on the results, i hope to order for you a treatment pill of radioactive iodine.  Although it is a larger amount of radiation, you will again notice no symptoms from this.  The pill is gone from your body in a few days (during which you should stay away from other people), but takes several months to work.  Therefore, please return here approximately 6-8 weeks after the treatment.  This treatment has been available for many years, and the only known side-effect is an underactive thyroid.  It is possible that i would eventually prescribe for you a thyroid hormone pill, which is very inexpensive.  You don't have to worry about side-effects of this thyroid hormone pill, because it is the same molecule your thyroid makes.

## 2020-10-17 ENCOUNTER — Other Ambulatory Visit (INDEPENDENT_AMBULATORY_CARE_PROVIDER_SITE_OTHER): Payer: BC Managed Care – PPO

## 2020-10-17 ENCOUNTER — Other Ambulatory Visit: Payer: Self-pay

## 2020-10-17 DIAGNOSIS — E0590 Thyrotoxicosis, unspecified without thyrotoxic crisis or storm: Secondary | ICD-10-CM

## 2020-10-17 LAB — TSH: TSH: 0.4 u[IU]/mL (ref 0.35–4.50)

## 2020-10-17 LAB — T4, FREE: Free T4: 0.73 ng/dL (ref 0.60–1.60)

## 2020-10-18 ENCOUNTER — Other Ambulatory Visit: Payer: BC Managed Care – PPO

## 2020-10-27 ENCOUNTER — Encounter (HOSPITAL_COMMUNITY)
Admission: RE | Admit: 2020-10-27 | Discharge: 2020-10-27 | Disposition: A | Discharge status: Home / Self Care | Payer: BC Managed Care – PPO | Source: Ambulatory Visit | Attending: Endocrinology | Admitting: Endocrinology

## 2020-10-27 DIAGNOSIS — E0590 Thyrotoxicosis, unspecified without thyrotoxic crisis or storm: Secondary | ICD-10-CM | POA: Insufficient documentation

## 2020-10-27 MED ORDER — SODIUM IODIDE I-123 7.4 MBQ CAPS
300.0000 | ORAL_CAPSULE | Freq: Once | ORAL | Status: AC
  Administered 2020-10-27: 300 via ORAL

## 2020-10-28 ENCOUNTER — Encounter (HOSPITAL_COMMUNITY)
Admission: RE | Admit: 2020-10-28 | Discharge: 2020-10-28 | Disposition: A | Discharge status: Home / Self Care | Payer: BC Managed Care – PPO | Source: Ambulatory Visit | Attending: Endocrinology | Admitting: Endocrinology

## 2020-10-29 ENCOUNTER — Other Ambulatory Visit: Payer: Self-pay | Admitting: Endocrinology

## 2020-10-29 DIAGNOSIS — E0590 Thyrotoxicosis, unspecified without thyrotoxic crisis or storm: Secondary | ICD-10-CM

## 2020-11-16 ENCOUNTER — Ambulatory Visit (HOSPITAL_COMMUNITY): Payer: BC Managed Care – PPO

## 2020-11-23 ENCOUNTER — Ambulatory Visit (HOSPITAL_COMMUNITY)
Admission: RE | Admit: 2020-11-23 | Discharge: 2020-11-23 | Disposition: A | Discharge status: Home / Self Care | Payer: BC Managed Care – PPO | Source: Ambulatory Visit | Attending: Endocrinology | Admitting: Endocrinology

## 2020-11-23 ENCOUNTER — Other Ambulatory Visit: Payer: Self-pay

## 2020-11-23 DIAGNOSIS — E0590 Thyrotoxicosis, unspecified without thyrotoxic crisis or storm: Secondary | ICD-10-CM | POA: Insufficient documentation

## 2020-11-23 MED ORDER — SODIUM IODIDE I 131 CAPSULE
28.8000 | Freq: Once | INTRAVENOUS | Status: AC | PRN
  Administered 2020-11-23: 28.8 via ORAL

## 2021-02-07 ENCOUNTER — Other Ambulatory Visit: Payer: Self-pay | Admitting: Nurse Practitioner

## 2021-02-07 ENCOUNTER — Telehealth: Payer: Self-pay

## 2021-02-07 ENCOUNTER — Encounter: Payer: Self-pay | Admitting: Nurse Practitioner

## 2021-02-07 ENCOUNTER — Ambulatory Visit (INDEPENDENT_AMBULATORY_CARE_PROVIDER_SITE_OTHER): Payer: BC Managed Care – PPO | Admitting: Nurse Practitioner

## 2021-02-07 DIAGNOSIS — W57XXXA Bitten or stung by nonvenomous insect and other nonvenomous arthropods, initial encounter: Secondary | ICD-10-CM | POA: Insufficient documentation

## 2021-02-07 DIAGNOSIS — S30861A Insect bite (nonvenomous) of abdominal wall, initial encounter: Secondary | ICD-10-CM

## 2021-02-07 MED ORDER — DOXYCYCLINE HYCLATE 100 MG PO TABS
100.0000 mg | ORAL_TABLET | Freq: Two times a day (BID) | ORAL | 0 refills | Status: DC
Start: 2021-02-07 — End: 2021-02-21

## 2021-02-07 MED ORDER — DOXYCYCLINE HYCLATE 200 MG PO TBEC
200.0000 mg | DELAYED_RELEASE_TABLET | Freq: Once | ORAL | 0 refills | Status: DC
Start: 2021-02-07 — End: 2021-02-07

## 2021-02-07 NOTE — Telephone Encounter (Signed)
Makayla Jackson called stating that they do not have 200mg  extended release.  They do have 100mg  it is not extended release though.  Pharmacy asked if Rx can be changed to Doxy 100mg  BID and pt may take longer if needed.  Ok to send back updated Rx by [facility]

## 2021-02-07 NOTE — Assessment & Plan Note (Addendum)
Doxycycline 200 mg tablet by mouth once, 1% hydrocortisone cream for itch, Tylenol for fever.  [facility] ABS/Western blot lab orders completed for future.

## 2021-02-07 NOTE — Progress Notes (Signed)
   Virtual Visit  Note Due to COVID-19 pandemic this visit was conducted virtually. This visit type was conducted due to national recommendations for restrictions regarding the COVID-19 Pandemic (e.g. social distancing, sheltering in place) in an effort to limit this patient's exposure and mitigate transmission in our community. All issues noted in this document were discussed and addressed.  A physical exam was not performed with this format.  I connected with Makayla Jackson on 02/07/21 at 8:43 AM by telephone and verified that I am speaking with the correct person using two identifiers. Makayla Jackson is currently located at home during visit. The provider, Ivy Lynn, NP is located in their office at time of visit.  I discussed the limitations, risks, security and privacy concerns of performing an evaluation and management service by telephone and the availability of in person appointments. I also discussed with the patient that there may be a patient responsible charge related to this service. The patient expressed understanding and agreed to proceed.   History and Present Illness:  HPI  Patient is a 55 year old female who is seen via televisit for worsening weakness and fatigue, fever and body ache.  Patient reports tick bite in the last week and believes symptoms is associated with tick bite.  No nausea, vomiting, no classic bull's-eye rash patient reports tick was stuck on her skin for less than 24 hours.  Review of Systems  Constitutional:  Positive for malaise/fatigue. Negative for chills.  HENT: Negative.    Eyes: Negative.   Respiratory: Negative.    Skin:  Positive for itching and rash.  All other systems reviewed and are negative.   Observations/Objective: Televisit patient not in distress  Assessment and Plan: Doxycycline 200 mg tablet by mouth once, 1% hydrocortisone cream for itch, Tylenol for fever.  [facility]yme ABS/Western blot lab orders completed for future.  Follow Up  Instructions: Follow-up with worsening or unresolved symptoms    I discussed the assessment and treatment plan with the patient. The patient was provided an opportunity to ask questions and all were answered. The patient agreed with the plan and demonstrated an understanding of the instructions.   The patient was advised to call back or seek an in-person evaluation if the symptoms worsen or if the condition fails to improve as anticipated.  The above assessment and management plan was discussed with the patient. The patient verbalized understanding of and has agreed to the management plan. Patient is aware to call the clinic if symptoms persist or worsen. Patient is aware when to return to the clinic for a follow-up visit. Patient educated on when it is appropriate to go to the emergency department.   Time call ended: 8:50 AM I provided 7 minutes of  non face-to-face time during this encounter.    Ivy Lynn, NP

## 2021-02-21 ENCOUNTER — Encounter: Payer: Self-pay | Admitting: Nurse Practitioner

## 2021-02-21 ENCOUNTER — Ambulatory Visit: Payer: BC Managed Care – PPO | Admitting: Nurse Practitioner

## 2021-02-21 DIAGNOSIS — J069 Acute upper respiratory infection, unspecified: Secondary | ICD-10-CM | POA: Diagnosis not present

## 2021-02-21 DIAGNOSIS — J029 Acute pharyngitis, unspecified: Secondary | ICD-10-CM

## 2021-02-21 MED ORDER — DM-GUAIFENESIN ER 30-600 MG PO TB12: 1.0000 | ORAL_TABLET | Freq: Two times a day (BID) | ORAL | 0 refills | Status: DC

## 2021-02-21 MED ORDER — AZITHROMYCIN 250 MG PO TABS: ORAL_TABLET | ORAL | 0 refills | Status: AC

## 2021-02-21 NOTE — Assessment & Plan Note (Signed)
Azithromycin 250 mg tablet by mouth take 2 tablets day 1, 1 tablet day 2 through 5.  Guaifenesin for cough and congestion.  Increase hydration,

## 2021-02-21 NOTE — Progress Notes (Signed)
   Virtual Visit  Note Due to COVID-19 pandemic this visit was conducted virtually. This visit type was conducted due to national recommendations for restrictions regarding the COVID-19 Pandemic (e.g. social distancing, sheltering in place) in an effort to limit this patient's exposure and mitigate transmission in our community. All issues noted in this document were discussed and addressed.  A physical exam was not performed with this format.  I connected with Makayla Jackson on 02/21/21 at 10:17 am by telephone and verified that I am speaking with the correct person using two identifiers. Makayla Jackson is currently located at home during visit. The provider, Ivy Lynn, NP is located in their office at time of visit.  I discussed the limitations, risks, security and privacy concerns of performing an evaluation and management service by telephone and the availability of in person appointments. I also discussed with the patient that there may be a patient responsible charge related to this service. The patient expressed understanding and agreed to proceed.   History and Present Illness:  Sore Throat  This is a recurrent problem. The current episode started in the past 7 days. The problem has been gradually worsening. Neither side of throat is experiencing more pain than the other. There has been no fever. The pain is moderate. Associated symptoms include congestion and coughing. Pertinent negatives include no headaches or swollen glands. She has had no exposure to strep. She has tried nothing for the symptoms.  URI  This is a recurrent problem. The current episode started in the past 7 days. The problem has been unchanged. There has been no fever. Associated symptoms include congestion, coughing and rhinorrhea. Pertinent negatives include no headaches or swollen glands. She has tried nothing for the symptoms.     Review of Systems  HENT:  Positive for congestion and rhinorrhea.   Respiratory:   Positive for cough.   Neurological:  Negative for headaches.    Observations/Objective: Televisit patient not in distress. Assessment and Plan: Azithromycin 250 mg tablet by mouth take 2 tablets day 1, 1 tablet day 2 through 5.  Guaifenesin for cough and congestion.  Increase hydration,  Follow Up Instructions: Follow-up with worsening unresolved symptoms.    I discussed the assessment and treatment plan with the patient. The patient was provided an opportunity to ask questions and all were answered. The patient agreed with the plan and demonstrated an understanding of the instructions.   The patient was advised to call back or seek an in-person evaluation if the symptoms worsen or if the condition fails to improve as anticipated.  The above assessment and management plan was discussed with the patient. The patient verbalized understanding of and has agreed to the management plan. Patient is aware to call the clinic if symptoms persist or worsen. Patient is aware when to return to the clinic for a follow-up visit. Patient educated on when it is appropriate to go to the emergency department.   Time call ended: 10:27 AM  I provided 10 minutes of  non face-to-face time during this encounter.    Ivy Lynn, NP

## 2021-02-23 IMAGING — US US THYROID
1 series · 13 of 25 positions shown · non-contrast
Comparison: None.

CLINICAL DATA: Goiter. History of previous thyroid nodule biopsy at
an outside institution.

EXAM:
THYROID ULTRASOUND
TECHNIQUE: Ultrasound examination of the thyroid gland and adjacent soft
tissues was performed.

[Series 1: us thyroid · 0.08mm/px · 13 of 54 slices shown]
[im 1/54]
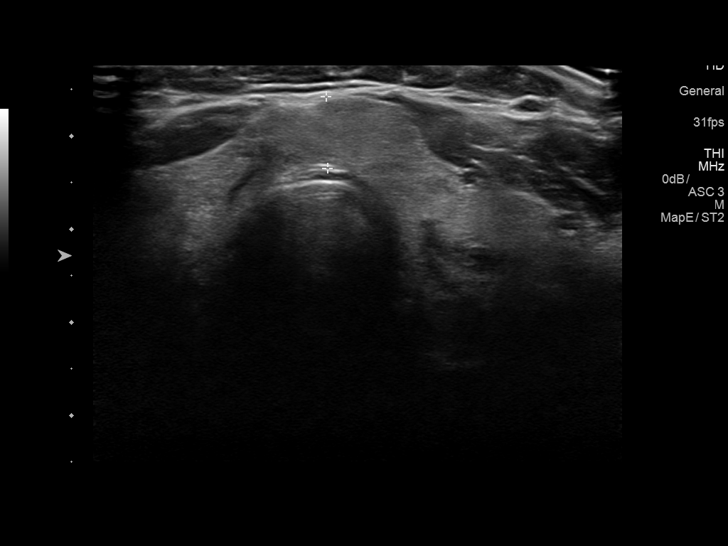
[im 5/54]
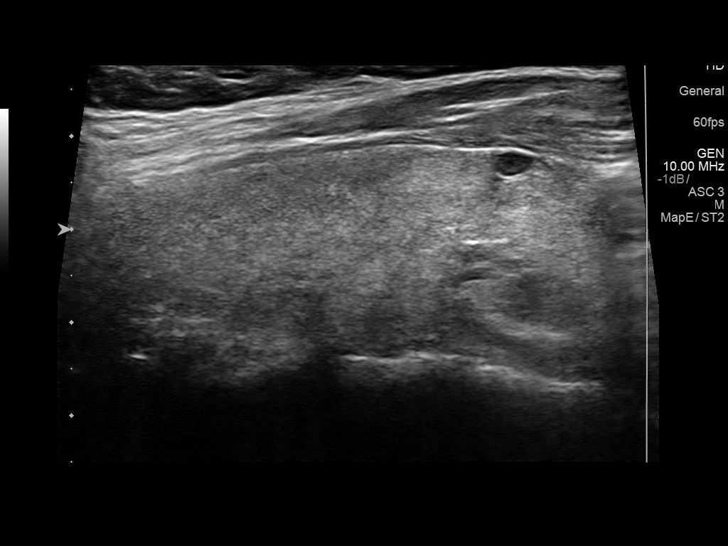
[im 9/54]
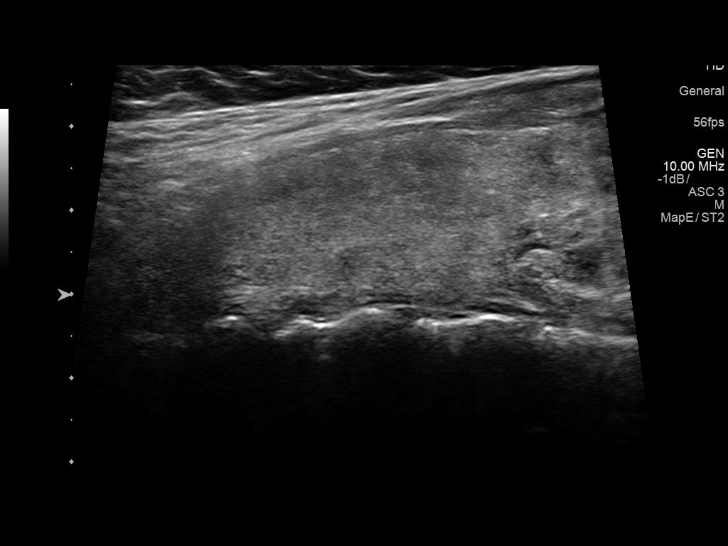
[im 14/54]
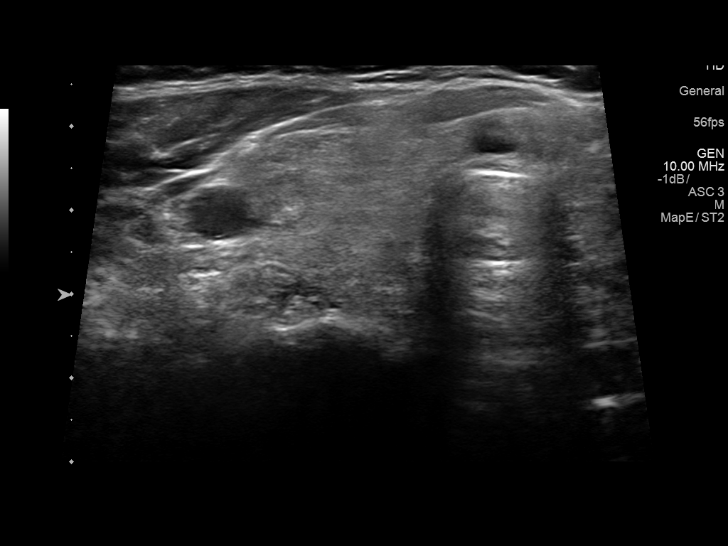
[im 18/54]
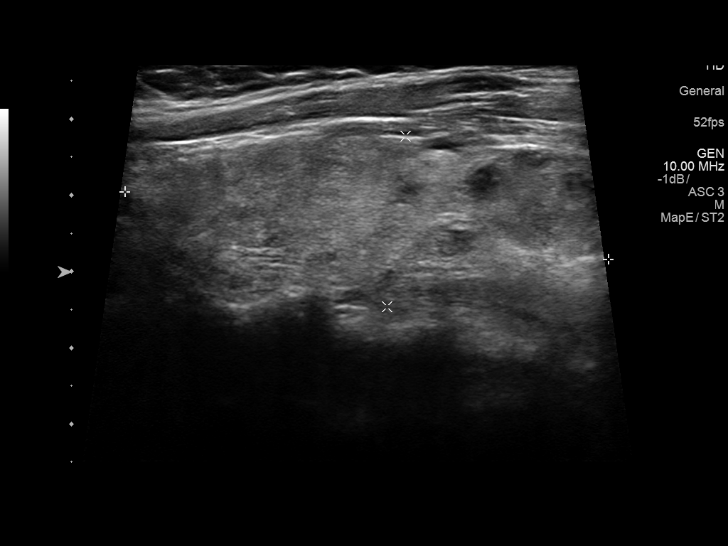
[im 23/54]
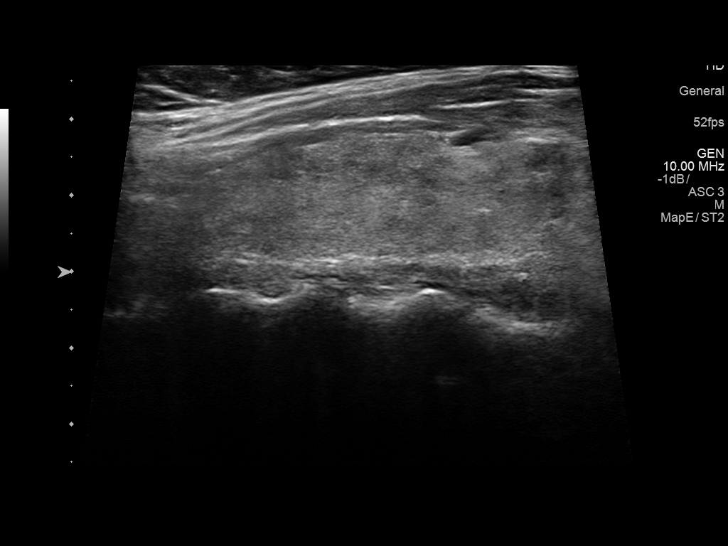
[im 27/54]
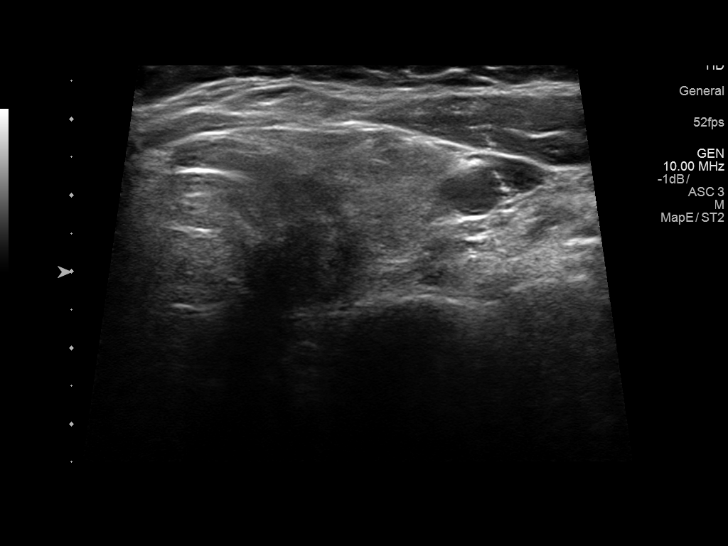
[im 31/54]
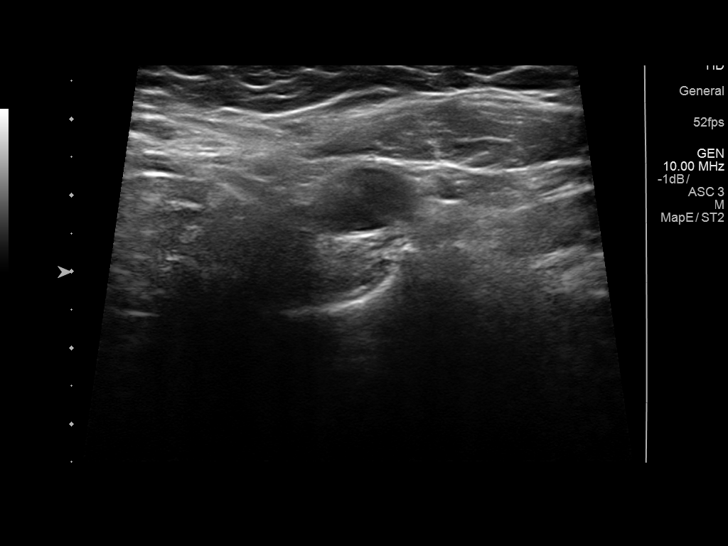
[im 36/54]
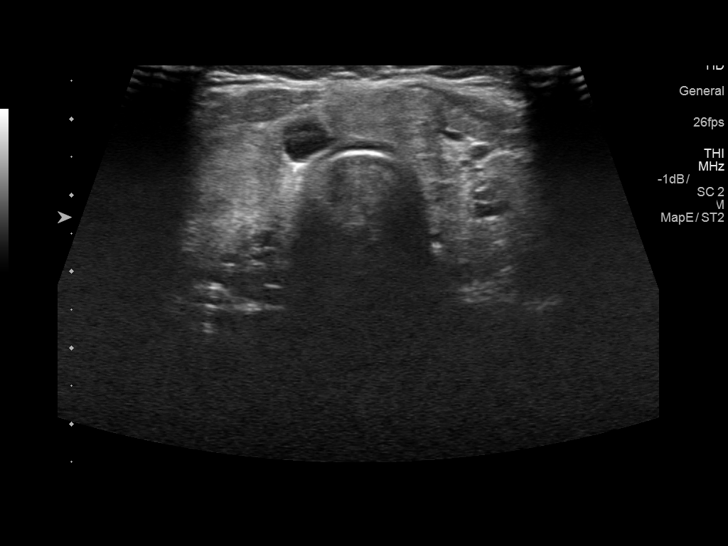
[im 40/54]
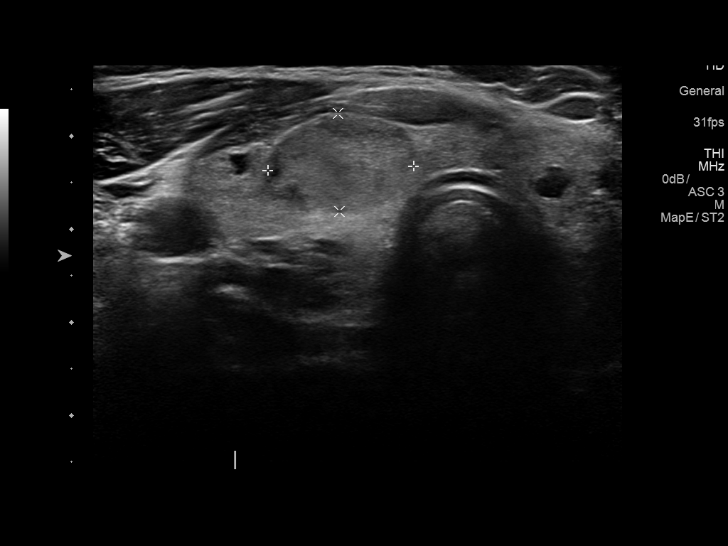
[im 45/54]
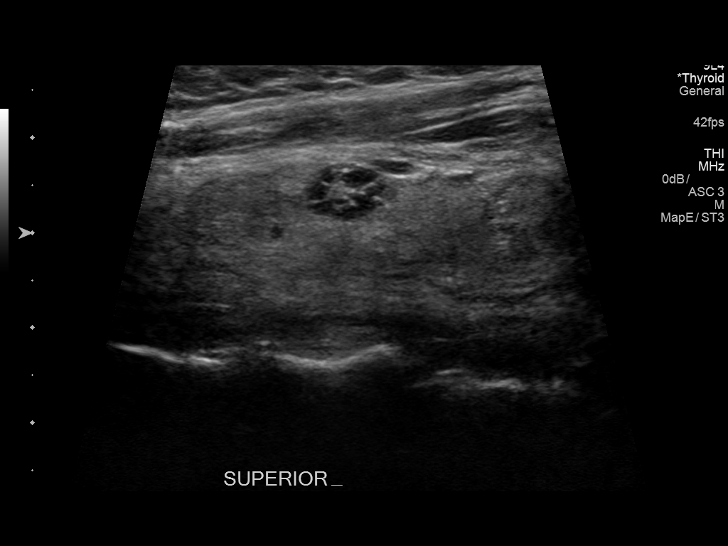
[im 49/54]
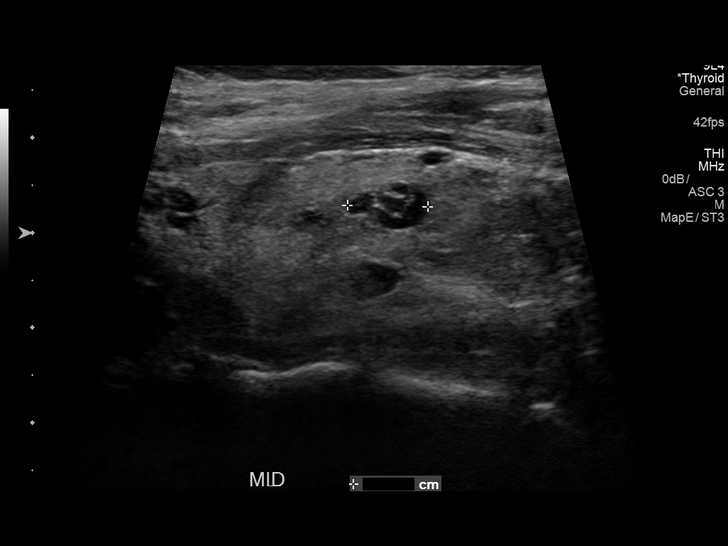
[im 54/54]
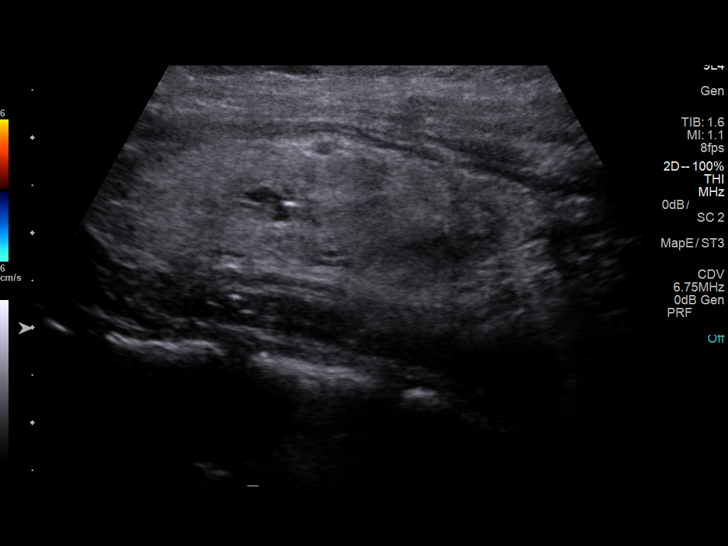

[13 of 25 positions shown; findings below may reference images not displayed]

FINDINGS: Parenchymal Echotexture: Mildly heterogenous

Isthmus: Normal in size measuring 0.8 cm in diameter

Right lobe: Normal in size measuring 5.8 x 2.3 x 2.5 cm

Left lobe: Borderline enlarged measuring 6.4 x 2.3 x 2.2 cm

_________________________________________________________

Estimated total number of nodules >/= 1 cm: 1

Number of spongiform nodules >/=  2 cm not described below (TR1): 0

Number of mixed cystic and solid nodules >/= 1.5 cm not described
below (TR2): 0

_________________________________________________________

There is an approximately 0.9 x 0.6 x 0.5 cm anechoic cyst within
the right-sided the thyroid isthmus (labeled 1), which does not meet
imaging criteria to recommend percutaneous sampling or continued
dedicated follow-up.

_________________________________________________________

Nodule # 2:

Location: Right; Mid

Maximum size: 1.9 cm; Other 2 dimensions: 1.6 x 1.1 cm

Composition: solid/almost completely solid (2)

Echogenicity: isoechoic (1)

Shape: not taller-than-wide (0)

Margins: ill-defined (0)

Echogenic foci: none (0)

ACR TI-RADS total points: 3.

ACR TI-RADS risk category: TR3 (3 points).

ACR TI-RADS recommendations:

*Given size (>/= 1.5 - 2.4 cm) and appearance, a follow-up
ultrasound in 1 year should be considered based on TI-RADS criteria.

_________________________________________________________

There is a 0.9 x 0.6 x 0.5 cm spongiform/benign appearing nodule
within the superior pole the left lobe of the thyroid (labeled 3),
which does not meet imaging criteria to recommend percutaneous
sampling or continued dedicated follow-up.

There is a 0.9 x 0.8 x 0.5 cm spongiform/benign appearing nodule
within the mid aspect the left lobe of the thyroid (labeled 4),
which does not meet imaging criteria to recommend percutaneous
sampling or continued dedicated follow-up.
IMPRESSION: 1. Findings suggestive of multinodular goiter.
2. Nodule #2 meets imaging criteria to recommend a 1 year follow-up
as indicated - however, by report, this patient may have undergone
an ultrasound-guided fine-needle aspiration at an outside
institution. If this dominant nodule has been previously biopsied
with benign diagnosis, dedicated follow-up is not necessary.

The above is in keeping with the ACR TI-RADS recommendations - [HOSPITAL] 7515;[DATE].

## 2021-04-20 ENCOUNTER — Encounter: Payer: Self-pay | Admitting: Family Medicine

## 2021-04-20 ENCOUNTER — Ambulatory Visit: Payer: BC Managed Care – PPO | Admitting: Family Medicine

## 2021-04-20 DIAGNOSIS — J0140 Acute pansinusitis, unspecified: Secondary | ICD-10-CM

## 2021-04-20 MED ORDER — DOXYCYCLINE HYCLATE 100 MG PO TABS: 100.0000 mg | ORAL_TABLET | Freq: Two times a day (BID) | ORAL | 0 refills | Status: AC

## 2021-04-20 NOTE — Progress Notes (Signed)
Virtual Visit via Telephone Note  I connected with Makayla Jackson on 04/20/21 at 3:11 PM by telephone and verified that I am speaking with the correct person using two identifiers. Makayla Jackson is currently located at home and nobody is currently with her during this visit. The provider, Loman Brooklyn, FNP is located in their office at time of visit.  I discussed the limitations, risks, security and privacy concerns of performing an evaluation and management service by telephone and the availability of in person appointments. I also discussed with the patient that there may be a patient responsible charge related to this service. The patient expressed understanding and agreed to proceed.  Subjective: PCP: Janora Norlander, DO  Chief Complaint  Patient presents with   URI   Patient complains of head congestion, headache, sneezing, ear pain/pressure, facial pain/pressure, fever, and postnasal drainage. Onset of symptoms was 1 week ago, unchanged since that time. She is drinking plenty of fluids. Evaluation to date: at home COVID test negative. Treatment to date:  honey, tea, netti-pot . She has a history of asthma. She does not smoke.    ROS: Per HPI  Current Outpatient Medications:    acetaminophen (TYLENOL) 325 MG tablet, Take 650 mg by mouth every 6 (six) hours., Disp: , Rfl:    albuterol (VENTOLIN HFA) 108 (90 Base) MCG/ACT inhaler, Inhale 2 puffs into the lungs every 6 (six) hours as needed for wheezing or shortness of breath., Disp: 8 g, Rfl: 0   ALPRAZolam (XANAX) 0.25 MG tablet, TAKE 0.5 to 1 TABLET(0.25 MG) BY MOUTH qhs AS NEEDED FOR SLEEP OR ANXIETY, Disp: 30 tablet, Rfl: 1   Azelastine-Fluticasone 137-50 MCG/ACT SUSP, 1 spray by Nasal route daily. In each nostril, Disp: , Rfl:    cefdinir (OMNICEF) 300 MG capsule, Take 1 capsule (300 mg total) by mouth 2 (two) times daily. 1 po BID, Disp: 20 capsule, Rfl: 0   cetirizine (ZYRTEC) 10 MG tablet, Take 10 mg by mouth daily.,  Disp: , Rfl:    dextromethorphan-guaiFENesin (MUCINEX DM) 30-600 MG 12hr tablet, Take 1 tablet by mouth 2 (two) times daily., Disp: 30 tablet, Rfl: 0   diclofenac (VOLTAREN) 75 MG EC tablet, Take 1 tablet (75 mg total) by mouth 2 (two) times daily., Disp: 30 tablet, Rfl: 0   fluticasone (FLOVENT HFA) 110 MCG/ACT inhaler, Inhale 2 puffs into the lungs 2 (two) times daily., Disp: 1 each, Rfl: 3   Rimegepant Sulfate (NURTEC) 75 MG TBDP, Take 1 tablet by mouth daily as needed (migraine)., Disp: 4 tablet, Rfl: 0   SUMAtriptan (IMITREX) 50 MG tablet, Take 1 tablet (50 mg total) by mouth daily as needed for Migraine (may repeat in 2 hours)., Disp: 10 tablet, Rfl: 3   valACYclovir (VALTREX) 1000 MG tablet, Take 2 tablets (2,000 mg total) by mouth 2 (two) times daily. x1 day per flare. Repeat as needed for each fever blister flare., Disp: 20 tablet, Rfl: 0  Allergies  Allergen Reactions   Other Anaphylaxis and Hives    Bee stings   Shellfish Allergy Swelling   Penicillins Hives   Sulfa Antibiotics Hives   Prednisone     Pt stated, "It made me feel slightly agitated. I could not sleep well"   Past Medical History:  Diagnosis Date   Arthritis    Asthma    Bowel obstruction ([place]) 08/2016   Diverticulitis    Miscarriage    Multiple thyroid nodules    Osteopenia  Parathyroid tumor    Pneumonia    SBO (small bowel obstruction) ([facility])    Thyroid disease     Observations/Objective: A&O  No respiratory distress or wheezing audible over the phone Mood, judgement, and thought processes all WNL  Assessment and Plan: 1. Acute non-recurrent pansinusitis Continue symptom management. - doxycycline (VIBRA-TABS) 100 MG tablet; Take 1 tablet (100 mg total) by mouth 2 (two) times daily for 7 days.  Dispense: 14 tablet; Refill: 0   Follow Up Instructions:  I discussed the assessment and treatment plan with the patient. The patient was provided an opportunity to ask questions and all were answered.  The patient agreed with the plan and demonstrated an understanding of the instructions.   The patient was advised to call back or seek an in-person evaluation if the symptoms worsen or if the condition fails to improve as anticipated.  The above assessment and management plan was discussed with the patient. The patient verbalized understanding of and has agreed to the management plan. Patient is aware to call the clinic if symptoms persist or worsen. Patient is aware when to return to the clinic for a follow-up visit. Patient educated on when it is appropriate to go to the emergency department.   Time call ended: 3:22 PM  I provided 11 minutes of non-face-to-face time during this encounter.  Hendricks Limes, MSN, APRN, FNP-C [facility] 04/20/21

## 2021-07-05 ENCOUNTER — Encounter: Payer: Self-pay | Admitting: Family Medicine

## 2021-07-05 ENCOUNTER — Ambulatory Visit (INDEPENDENT_AMBULATORY_CARE_PROVIDER_SITE_OTHER): Payer: BC Managed Care – PPO | Admitting: Family Medicine

## 2021-07-05 DIAGNOSIS — J0100 Acute maxillary sinusitis, unspecified: Secondary | ICD-10-CM | POA: Diagnosis not present

## 2021-07-05 MED ORDER — AZITHROMYCIN 250 MG PO TABS: ORAL_TABLET | ORAL | 0 refills | Status: DC

## 2021-07-05 NOTE — Progress Notes (Signed)
   Virtual Visit  Note Due to COVID-19 pandemic this visit was conducted virtually. This visit type was conducted due to national recommendations for restrictions regarding the COVID-19 Pandemic (e.g. social distancing, sheltering in place) in an effort to limit this patient's exposure and mitigate transmission in our community. All issues noted in this document were discussed and addressed.  A physical exam was not performed with this format.  I connected with Makayla Jackson on 07/05/21 at [phone] by telephone and verified that I am speaking with the correct person using two identifiers. Makayla Jackson is currently located at home and no one is currently with her during the visit. The provider, Gwenlyn Perking, FNP is located in their office at time of visit.  I discussed the limitations, risks, security and privacy concerns of performing an evaluation and management service by telephone and the availability of in person appointments. I also discussed with the patient that there may be a patient responsible charge related to this service. The patient expressed understanding and agreed to proceed.  CC: sinusitis  History and Present Illness:  HPI Makayla Jackson reports congestion and postnasal drip x 10 days. She has had a daily headache. Reports maxillary tenderness for 5 days. She denies fever. She has been taking OTC decongestants without little improvement.     ROS As per HPI.   Observations/Objective: Alert and oriented x 3. Able to speak in full sentences without difficulty.    Assessment and Plan: Makayla Jackson was seen today for sinusitis.  Diagnoses and all orders for this visit:  Acute non-recurrent maxillary sinusitis Zpak as below. Has been treated with doxycyline in the last 90 days. Allergic to PCN. Discussed symptomatic care and return precautions.  -     azithromycin (ZITHROMAX Z-PAK) 250 MG tablet; As directed    Follow Up Instructions: As needed.     I discussed the assessment and  treatment plan with the patient. The patient was provided an opportunity to ask questions and all were answered. The patient agreed with the plan and demonstrated an understanding of the instructions.   The patient was advised to call back or seek an in-person evaluation if the symptoms worsen or if the condition fails to improve as anticipated.  The above assessment and management plan was discussed with the patient. The patient verbalized understanding of and has agreed to the management plan. Patient is aware to call the clinic if symptoms persist or worsen. Patient is aware when to return to the clinic for a follow-up visit. Patient educated on when it is appropriate to go to the emergency department.   Time call ended:  [phone]  I provided 11 minutes of  non face-to-face time during this encounter.    Gwenlyn Perking, FNP

## 2021-08-14 ENCOUNTER — Ambulatory Visit (INDEPENDENT_AMBULATORY_CARE_PROVIDER_SITE_OTHER): Payer: BC Managed Care – PPO | Admitting: Nurse Practitioner

## 2021-08-14 ENCOUNTER — Encounter: Payer: Self-pay | Admitting: Nurse Practitioner

## 2021-08-14 VITALS — BP 129/80 | HR 76 | Temp 98.1°F | Resp 20 | Ht 64.0 in | Wt 160.0 lb

## 2021-08-14 DIAGNOSIS — J0110 Acute frontal sinusitis, unspecified: Secondary | ICD-10-CM | POA: Insufficient documentation

## 2021-08-14 DIAGNOSIS — H10023 Other mucopurulent conjunctivitis, bilateral: Secondary | ICD-10-CM

## 2021-08-14 DIAGNOSIS — H109 Unspecified conjunctivitis: Secondary | ICD-10-CM | POA: Insufficient documentation

## 2021-08-14 MED ORDER — BACITRACIN-POLYMYXIN B 500-10000 UNIT/GM OP OINT: 1.0000 "application " | TOPICAL_OINTMENT | Freq: Two times a day (BID) | OPHTHALMIC | 1 refills | Status: DC

## 2021-08-14 MED ORDER — DOXYCYCLINE HYCLATE 100 MG PO TABS: 100.0000 mg | ORAL_TABLET | Freq: Two times a day (BID) | ORAL | 0 refills | Status: DC

## 2021-08-14 NOTE — Patient Instructions (Signed)
Bacterial Conjunctivitis, Adult Bacterial conjunctivitis is an infection of your conjunctiva. This is the clear membrane that covers the white part of your eye and the inner part of your eyelid. This infection can make your eye: Red or pink. Itchy or irritated. This condition spreads easily from person to person (is contagious) and from one eye to the other eye. What are the causes? This condition is caused by germs (bacteria). You may get the infection if you come into close contact with: A person who has the infection. Items that have germs on them (are contaminated), such as face towels, contact lens solution, or eye makeup. What increases the risk? You are more likely to get this condition if: You have contact with people who have the infection. You wear contact lenses. You have a sinus infection. You have had a recent eye injury or surgery. You have a weak body defense system (immune system). You have dry eyes. What are the signs or symptoms?  Thick, yellowish discharge from the eye. Tearing or watery eyes. Itchy eyes. Burning feeling in your eyes. Eye redness. Swollen eyelids. Blurred vision. How is this treated?  Antibiotic eye drops or ointment. Antibiotic medicine taken by mouth. This is used for infections that do not get better with drops or ointment or that last more than 10 days. Cool, wet cloths placed on the eyes. Artificial tears used 2-6 times a day. Follow these instructions at home: Medicines Take or apply your antibiotic medicine as told by your doctor. Do not stop using it even if you start to feel better. Take or apply over-the-counter and prescription medicines only as told by your doctor. Do not touch your eyelid with the eye-drop bottle or the ointment tube. Managing discomfort Wipe any fluid from your eye with a warm, wet washcloth or a cotton ball. Place a clean, cool, wet cloth on your eye. Do this for 10-20 minutes, 3-4 times a day. General  instructions Do not wear contacts until the infection is gone. Wear glasses until your doctor says it is okay to wear contacts again. Do not wear eye makeup until the infection is gone. Throw away old eye makeup. Change or wash your pillowcase every day. Do not share towels or washcloths. Wash your hands often with soap and water for at least 20 seconds and especially before touching your face or eyes. Use paper towels to dry your hands. Do not touch or rub your eyes. Do not drive or use heavy machinery if your vision is blurred. Contact a doctor if: You have a fever. You do not get better after 10 days. Get help right away if: You have a fever and your symptoms get worse all of a sudden. You have very bad pain when you move your eye. Your face: Hurts. Is red. Is swollen. You have sudden loss of vision. Summary Bacterial conjunctivitis is an infection of your conjunctiva. This infection spreads easily from person to person. Wash your hands often with soap and water for at least 20 seconds and especially before touching your face or eyes. Use paper towels to dry your hands. Take or apply your antibiotic medicine as told by your doctor. Contact a doctor if you have a fever or you do not get better after 10 days. This information is not intended to replace advice given to you by your health care provider. Make sure you discuss any questions you have with your health care provider. Document Revised: 11/02/2020 Document Reviewed: 11/02/2020 Elsevier Patient Education  [place]. Sinusitis, Adult Sinusitis is soreness and swelling (inflammation) of your sinuses. Sinuses are hollow spaces in the bones around your face. They are located: Around your eyes. In the middle of your forehead. Behind your nose. In your cheekbones. Your sinuses and nasal passages are lined with a fluid called mucus. Mucus drains out of your sinuses. Swelling can trap mucus in your sinuses. This lets germs  (bacteria, virus, or fungus) grow, which leads to infection. Most of the time, this condition is caused by a virus. What are the causes? This condition is caused by: Allergies. Asthma. Germs. Things that block your nose or sinuses. Growths in the nose (nasal polyps). Chemicals or irritants in the air. Fungus (rare). What increases the risk? You are more likely to develop this condition if: You have a weak body defense system (immune system). You do a lot of swimming or diving. You use nasal sprays too much. You smoke. What are the signs or symptoms? The main symptoms of this condition are pain and a feeling of pressure around the sinuses. Other symptoms include: Stuffy nose (congestion). Runny nose (drainage). Swelling and warmth in the sinuses. Headache. Toothache. A cough that may get worse at night. Mucus that collects in the throat or the back of the nose (postnasal drip). Being unable to smell and taste. Being very tired (fatigue). A fever. Sore throat. Bad breath. How is this diagnosed? This condition is diagnosed based on: Your symptoms. Your medical history. A physical exam. Tests to find out if your condition is short-term (acute) or long-term (chronic). Your doctor may: Check your nose for growths (polyps). Check your sinuses using a tool that has a light (endoscope). Check for allergies or germs. Do imaging tests, such as an MRI or CT scan. How is this treated? Treatment for this condition depends on the cause and whether it is short-term or long-term. If caused by a virus, your symptoms should go away on their own within 10 days. You may be given medicines to relieve symptoms. They include: Medicines that shrink swollen tissue in the nose. Medicines that treat allergies (antihistamines). A spray that treats swelling of the nostrils.  Rinses that help get rid of thick mucus in your nose (nasal saline washes). If caused by bacteria, your doctor may wait to see  if you will get better without treatment. You may be given antibiotic medicine if you have: A very bad infection. A weak body defense system. If caused by growths in the nose, you may need to have surgery. Follow these instructions at home: Medicines Take, use, or apply over-the-counter and prescription medicines only as told by your doctor. These may include nasal sprays. If you were prescribed an antibiotic medicine, take it as told by your doctor. Do not stop taking the antibiotic even if you start to feel better. Hydrate and humidify  Drink enough water to keep your pee (urine) pale yellow. Use a cool mist humidifier to keep the humidity level in your home above 50%. Breathe in steam for 10-15 minutes, 3-4 times a day, or as told by your doctor. You can do this in the bathroom while a hot shower is running. Try not to spend time in cool or dry air. Rest Rest as much as you can. Sleep with your head raised (elevated). Make sure you get enough sleep each night. General instructions  Put a warm, moist washcloth on your face 3-4 times a day, or as often as told by your doctor. This  will help with discomfort. Wash your hands often with soap and water. If there is no soap and water, use hand sanitizer. Do not smoke. Avoid being around people who are smoking (secondhand smoke). Keep all follow-up visits as told by your doctor. This is important. Contact a doctor if: You have a fever. Your symptoms get worse. Your symptoms do not get better within 10 days. Get help right away if: You have a very bad headache. You cannot stop throwing up (vomiting). You have very bad pain or swelling around your face or eyes. You have trouble seeing. You feel confused. Your neck is stiff. You have trouble breathing. Summary Sinusitis is swelling of your sinuses. Sinuses are hollow spaces in the bones around your face. This condition is caused by tissues in your nose that become inflamed or swollen.  This traps germs. These can lead to infection. If you were prescribed an antibiotic medicine, take it as told by your doctor. Do not stop taking it even if you start to feel better. Keep all follow-up visits as told by your doctor. This is important. This information is not intended to replace advice given to you by your health care provider. Make sure you discuss any questions you have with your health care provider. Document Revised: 12/23/2017 Document Reviewed: 12/23/2017 Elsevier Patient Education  2022 [employer].

## 2021-08-14 NOTE — Progress Notes (Signed)
Acute Office Visit  Subjective:    Patient ID: Makayla Jackson, female    DOB: 03-Feb-1966, 56 y.o.   MRN: [id number]  Chief Complaint  Patient presents with   Sinus Problem   Eyes red and itching    Sinus Problem This is a new problem. The current episode started in the past 7 days. The problem is unchanged. There has been no fever. The pain is moderate. Associated symptoms include congestion, headaches and sinus pressure. Pertinent negatives include no chills, coughing or ear pain. Past treatments include nothing.  Conjunctivitis  The current episode started 2 days ago. The onset was gradual. The problem has been unchanged. The problem is moderate. Nothing relieves the symptoms. Nothing aggravates the symptoms. Associated symptoms include congestion, ear discharge, headaches and eye redness. Pertinent negatives include no fever, no photophobia, no nausea, no ear pain, no cough and no rash.    Past Medical History:  Diagnosis Date   Arthritis    Asthma    Bowel obstruction ([place]) 08/2016   Diverticulitis    Miscarriage    Multiple thyroid nodules    Osteopenia    Parathyroid tumor    Pneumonia    SBO (small bowel obstruction) ([place])    Thyroid disease     Past Surgical History:  Procedure Laterality Date   ABDOMINAL HYSTERECTOMY     COLON SURGERY  2014   Diverticulosis resection, after diverticulitis   FINE NEEDLE ASPIRATION     Thyroid Nodule   KNEE ARTHROSCOPY Right    NASAL SINUS SURGERY  1998   PARATHYROIDECTOMY  12/2014   PARTIAL HYSTERECTOMY  2010   TONSILLECTOMY      Family History  Problem Relation Age of Onset   Arthritis Mother    Arthritis/Rheumatoid Mother    Asthma Mother    Heart disease Mother    Hypertension Mother    Stroke Mother    Vision loss Mother    Thyroid disease Mother    Colon polyps Mother    Thyroid cancer Mother    Arthritis Father    Heart disease Father    Hypertension Father    Colon polyps Father    Diverticulitis Father         with colon surgery   Arthritis/Rheumatoid Maternal Aunt    Arthritis/Rheumatoid Maternal Uncle    Diabetes Maternal Grandmother    Lung cancer Maternal Grandfather    Cancer Paternal Grandfather        type unknown   Colon cancer Cousin    Thyroid cancer Cousin    Breast cancer Cousin     Social History   Socioeconomic History   Marital status: Married    Spouse name: Not on file   Number of children: 3   Years of education: Not on file   Highest education level: Not on file  Occupational History   Occupation: Product manager: [employer]  Tobacco Use   Smoking status: Never   Smokeless tobacco: Never  Vaping Use   Vaping Use: Never used  Substance and Sexual Activity   Alcohol use: Yes    Alcohol/week: 1.0 standard drink    Types: 1 Glasses of wine per week    Comment: occasional glass of wine    Drug use: No   Sexual activity: Not on file  Other Topics Concern   Not on file  Social History Narrative   The patient is married.  Her husband is retiring as Neurosurgeon of  [employer] of [employer] after serving in [place].   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     albuterol (VENTOLIN HFA) 108 (90 Base) MCG/ACT inhaler Inhale 2 puffs into the lungs every 6 (six) hours as needed for wheezing or shortness of breath. 8 g 0   cetirizine (ZYRTEC) 10 MG tablet Take 10 mg by mouth daily.     fluticasone (FLOVENT HFA) 110 MCG/ACT inhaler Inhale 2 puffs  into the lungs 2 (two) times daily. 1 each 3   Rimegepant Sulfate (NURTEC) 75 MG TBDP Take 1 tablet by mouth daily as needed (migraine). 4 tablet 0   SUMAtriptan (IMITREX) 50 MG tablet Take 1 tablet (50 mg total) by mouth daily as needed for Migraine (may repeat in 2 hours). 10 tablet 3   valACYclovir (VALTREX) 1000 MG tablet Take 2 tablets (2,000 mg total) by mouth 2 (two) times daily. x1 day per flare. Repeat as needed for each fever blister flare. 20 tablet 0   Azelastine-Fluticasone 137-50 MCG/ACT SUSP 1 spray by Nasal route daily. In each nostril     ALPRAZolam (XANAX) 0.25 MG tablet TAKE 0.5 to 1 TABLET(0.25 MG) BY MOUTH qhs AS NEEDED FOR SLEEP OR ANXIETY (Patient not taking: Reported on 08/14/2021) 30 tablet 1   azithromycin (ZITHROMAX Z-PAK) 250 MG tablet As directed 6 tablet 0   cefdinir (OMNICEF) 300 MG capsule Take 1 capsule (300 mg total) by mouth 2 (two) times daily. 1 po BID 20 capsule 0   dextromethorphan-guaiFENesin (MUCINEX DM) 30-600 MG 12hr tablet Take 1 tablet by mouth 2 (two) times daily. 30 tablet 0   diclofenac (VOLTAREN) 75 MG EC tablet Take 1 tablet (75 mg total) by mouth 2 (two) times daily. 30 tablet 0   No facility-administered medications prior to visit.    Allergies  Allergen Reactions   Other Anaphylaxis and Hives    Bee stings   Shellfish Allergy Swelling   Penicillins Hives   Sulfa Antibiotics Hives   Prednisone     Pt stated, "It made me feel slightly agitated. I could not sleep well"    Review of Systems  Constitutional:  Negative for chills and fever.  HENT:  Positive for congestion, ear discharge and sinus pressure. Negative for ear pain.   Eyes:  Positive for redness. Negative for photophobia.  Respiratory:  Negative for cough.   Gastrointestinal:  Negative for nausea.  Skin:  Negative for rash.  Neurological:  Positive for headaches.  All other systems reviewed and are negative.     Objective:    Physical Exam Vitals and nursing note  reviewed.  Constitutional:      Appearance: Normal appearance.  HENT:     Head: Normocephalic.     Right Ear: Ear canal and external ear normal.     Left Ear: Ear canal and external ear normal.     Nose: Congestion present.     Mouth/Throat:  Mouth: Mucous membranes are moist.     Pharynx: Oropharynx is clear.  Eyes:     Conjunctiva/sclera: Conjunctivae normal.  Cardiovascular:     Rate and Rhythm: Normal rate and regular rhythm.     Pulses: Normal pulses.     Heart sounds: Normal heart sounds.  Pulmonary:     Effort: Pulmonary effort is normal.     Breath sounds: Normal breath sounds.  Abdominal:     General: Bowel sounds are normal.  Musculoskeletal:        General: Normal range of motion.  Skin:    General: Skin is warm.     Findings: No rash.  Neurological:     Mental Status: She is alert and oriented to person, place, and time.  Psychiatric:        Behavior: Behavior normal.    BP 129/80    Pulse 76    Temp 98.1 F (36.7 C) (Oral)    Resp 20    Ht 5\' 4"  (1.626 m)    Wt 160 lb (72.6 kg)    SpO2 99%    BMI 27.46 kg/m  Wt Readings from Last 3 Encounters:  08/14/21 160 lb (72.6 kg)  10/14/20 155 lb 9.6 oz (70.6 kg)  08/11/20 157 lb (71.2 kg)    Health Maintenance Due  Topic Date Due   Pneumococcal Vaccine 4-90 Years old (1 - PCV) Never done   Zoster Vaccines- Shingrix (1 of 2) Never done   COVID-19 Vaccine (3 - Moderna risk series) 02/09/2020    There are no preventive care reminders to display for this patient.   Lab Results  Component Value Date   TSH 0.40 10/17/2020   Lab Results  Component Value Date   WBC 8.6 10/21/2019   HGB 14.8 10/21/2019   HCT 43.8 10/21/2019   MCV 88.8 10/21/2019   PLT 252.0 10/21/2019   Lab Results  Component Value Date   NA 140 02/09/2020   K 4.2 02/09/2020   CO2 24 02/09/2020   GLUCOSE 86 02/09/2020   BUN 11 02/09/2020   CREATININE 0.81 02/09/2020   BILITOT 0.2 02/09/2020   ALKPHOS 70 02/09/2020   AST 20  02/09/2020   ALT 15 02/09/2020   PROT 6.5 02/09/2020   ALBUMIN 4.5 02/09/2020   CALCIUM 9.5 02/09/2020   GFR 76.36 10/21/2019   Lab Results  Component Value Date   CHOL 203 (H) 02/09/2020   Lab Results  Component Value Date   HDL 93 02/09/2020   Lab Results  Component Value Date   LDLCALC 97 02/09/2020   Lab Results  Component Value Date   TRIG 75 02/09/2020   Lab Results  Component Value Date   CHOLHDL 2.2 02/09/2020   Lab Results  Component Value Date   HGBA1C 5.2 02/09/2020       Assessment & Plan:    Patient presents for sinusitis and conjunctivitis.  Take medication as prescribed - Use a cool mist humidifier  -Use saline nose sprays frequently -Force fluids -For fever or aches or pains- take Tylenol or ibuprofen. -If symptoms do not improve, she may need to be COVID tested to rule this out Follow up with worsening unresolved symptoms  Doxycycline 100 mg tablet by mouth twice daily for 7 days. -Polysporin ophthalmic ointment  Problem List Items Addressed This Visit       Respiratory   Subacute frontal sinusitis - Primary   Relevant Medications   doxycycline (VIBRA-TABS) 100 MG tablet  Other   Conjunctivitis   Relevant Medications   bacitracin-polymyxin b (POLYSPORIN) ophthalmic ointment     Meds ordered this encounter  Medications   doxycycline (VIBRA-TABS) 100 MG tablet    Sig: Take 1 tablet (100 mg total) by mouth 2 (two) times daily.    Dispense:  14 tablet    Refill:  0    Order Specific Question:   Supervising Provider    Answer:   Jeneen Rinks   bacitracin-polymyxin b (POLYSPORIN) ophthalmic ointment    Sig: Place 1 application into both eyes every 12 (twelve) hours. apply to eye every 12 hours while awake    Dispense:  3.5 g    Refill:  1    Order Specific Question:   Supervising Provider    Answer:   Claretta Fraise [[id number]]     Ivy Lynn, NP

## 2021-09-05 ENCOUNTER — Encounter: Payer: Self-pay | Admitting: Family Medicine

## 2021-09-05 ENCOUNTER — Ambulatory Visit (INDEPENDENT_AMBULATORY_CARE_PROVIDER_SITE_OTHER): Payer: BC Managed Care – PPO | Admitting: Family Medicine

## 2021-09-05 VITALS — BP 122/81 | HR 69 | Temp 97.0°F | Ht 64.0 in | Wt 160.6 lb

## 2021-09-05 DIAGNOSIS — Z0000 Encounter for general adult medical examination without abnormal findings: Secondary | ICD-10-CM | POA: Diagnosis not present

## 2021-09-05 DIAGNOSIS — Z79899 Other long term (current) drug therapy: Secondary | ICD-10-CM

## 2021-09-05 DIAGNOSIS — E7800 Pure hypercholesterolemia, unspecified: Secondary | ICD-10-CM | POA: Diagnosis not present

## 2021-09-05 DIAGNOSIS — E663 Overweight: Secondary | ICD-10-CM | POA: Diagnosis not present

## 2021-09-05 DIAGNOSIS — E0590 Thyrotoxicosis, unspecified without thyrotoxic crisis or storm: Secondary | ICD-10-CM

## 2021-09-05 DIAGNOSIS — B001 Herpesviral vesicular dermatitis: Secondary | ICD-10-CM

## 2021-09-05 DIAGNOSIS — J4520 Mild intermittent asthma, uncomplicated: Secondary | ICD-10-CM

## 2021-09-05 DIAGNOSIS — F419 Anxiety disorder, unspecified: Secondary | ICD-10-CM | POA: Diagnosis not present

## 2021-09-05 DIAGNOSIS — Z713 Dietary counseling and surveillance: Secondary | ICD-10-CM | POA: Diagnosis not present

## 2021-09-05 MED ORDER — VALACYCLOVIR HCL 1 G PO TABS: 2000.0000 mg | ORAL_TABLET | Freq: Two times a day (BID) | ORAL | 0 refills | Status: DC

## 2021-09-05 MED ORDER — ALBUTEROL SULFATE HFA 108 (90 BASE) MCG/ACT IN AERS: 2.0000 | INHALATION_SPRAY | Freq: Four times a day (QID) | RESPIRATORY_TRACT | 0 refills | Status: DC | PRN

## 2021-09-05 MED ORDER — ALPRAZOLAM 0.25 MG PO TABS: ORAL_TABLET | ORAL | 1 refills | Status: DC

## 2021-09-05 MED ORDER — FLUTICASONE PROPIONATE HFA 110 MCG/ACT IN AERO: INHALATION_SPRAY | RESPIRATORY_TRACT | 3 refills | Status: DC

## 2021-09-05 MED ORDER — WEGOVY 0.5 MG/0.5ML ~~LOC~~ SOAJ: 0.5000 mg | SUBCUTANEOUS | 1 refills | Status: DC

## 2021-09-05 NOTE — Progress Notes (Signed)
Makayla Jackson is a 56 y.o. female presents to office today for annual physical exam examination.    Concerns today include: 1.  Overweight Patient reports that she continues to struggle with being overweight.  She is try to make modifications to her lifestyle but has been relatively unsuccessful as of late and unfortunately has not been able to get off her extra pounds.  She reports that she believes she has been in quite some time, in fact that she was pregnant.  She has history of radioactive iodine treatment for hyperthyroidism.  She has not had her thyroid levels checked in a while and she would like to get this done.  No known history of medullary thyroid cancer or multiple endocrine type II neoplasia.  2.  Panic disorder Patient continues to struggle with some issues and stressors at home.  She uses the alprazolam extremely sparingly but would like to have a prescription on hand.  Denies any excessive daytime sedation, falls, respiratory depression.  Marital status: married, Substance use: Rare wine Diet: Typical American.  Exercise: No structured Last eye exam: Needs Last colonoscopy: Up-to-date next due October 2024 Last mammogram: Up-to-date Last pap smear: N/A.  Patient has history of partial hysterectomy.  Unsure if she has cervical cuff Refills needed today: All Immunizations needed: Would like to hold off on shingles vaccination Immunization History  Administered Date(s) Administered   Hepatitis B 05/14/1995, 06/18/1995, 03/11/1996   Influenza, Seasonal, Injecte, Preservative Fre 08/20/2012, 07/20/2015, 08/17/2016   Influenza,inj,Quad PF,6+ Mos 05/07/2018, 05/14/2019, 08/11/2020   Influenza,inj,Quad PF,6-35 Mos 07/20/2015, 08/17/2016   Influenza,trivalent, recombinat, inj, PF 08/20/2012   Moderna Sars-Covid-2 Vaccination 12/10/2019, 01/12/2020   PPD Test 03/07/2018   Tdap 02/09/2020     Past Medical History:  Diagnosis Date   Arthritis    Asthma    Bowel  obstruction ([place]) 08/2016   Diverticulitis    Miscarriage    Multiple thyroid nodules    Osteopenia    Parathyroid tumor    Pneumonia    SBO (small bowel obstruction) ([facility])    Thyroid disease    Social History   Socioeconomic History   Marital status: Married    Spouse name: Not on file   Number of children: 3   Years of education: Not on file   Highest education level: Not on file  Occupational History   Occupation: Product manager: [employer]  Tobacco Use   Smoking status: Never   Smokeless tobacco: Never  Vaping Use   Vaping Use: Never used  Substance and Sexual Activity   Alcohol use: Yes    Alcohol/week: 1.0 standard drink    Types: 1 Glasses of wine per week    Comment: occasional glass of wine    Drug use: No   Sexual activity: Not on file  Other Topics Concern   Not on file  Social History Narrative   The patient is married.  Her husband is retiring as Occupational hygienist of [employer] after serving in [place].   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, no tobacco no drug use   Social Determinants of Radio broadcast assistant Strain: Not on file  Food Insecurity: Not on file  Transportation Needs: Not on file  Physical Activity: Not on file  Stress: Not on file  Social Connections: Not on file  Intimate Partner Violence: Not on file  Past Surgical History:  Procedure Laterality Date   ABDOMINAL HYSTERECTOMY     COLON SURGERY  2014   Diverticulosis resection, after diverticulitis   FINE NEEDLE ASPIRATION     Thyroid Nodule   KNEE ARTHROSCOPY Right    NASAL SINUS SURGERY  1998   PARATHYROIDECTOMY  12/2014   PARTIAL HYSTERECTOMY  2010   TONSILLECTOMY     Family History  Problem Relation Age of Onset   Arthritis Mother    Arthritis/Rheumatoid Mother    Asthma Mother    Heart disease Mother    Hypertension Mother    Stroke Mother    Vision loss Mother     Thyroid disease Mother    Colon polyps Mother    Thyroid cancer Mother    Arthritis Father    Heart disease Father    Hypertension Father    Colon polyps Father    Diverticulitis Father        with colon surgery   Arthritis/Rheumatoid Maternal Aunt    Arthritis/Rheumatoid Maternal Uncle    Diabetes Maternal Grandmother    Lung cancer Maternal Grandfather    Cancer Paternal Grandfather        type unknown   Colon cancer Cousin    Thyroid cancer Cousin    Breast cancer Cousin     Current Outpatient Medications:    acetaminophen (TYLENOL) 325 MG tablet, Take 650 mg by mouth every 6 (six) hours., Disp: , Rfl:    albuterol (VENTOLIN HFA) 108 (90 Base) MCG/ACT inhaler, Inhale 2 puffs into the lungs every 6 (six) hours as needed for wheezing or shortness of breath., Disp: 8 g, Rfl: 0   ALPRAZolam (XANAX) 0.25 MG tablet, TAKE 0.5 to 1 TABLET(0.25 MG) BY MOUTH qhs AS NEEDED FOR SLEEP OR ANXIETY, Disp: 30 tablet, Rfl: 1   cetirizine (ZYRTEC) 10 MG tablet, Take 10 mg by mouth daily., Disp: , Rfl:    fluticasone (FLOVENT HFA) 110 MCG/ACT inhaler, Inhale 2 puffs into the lungs 2 (two) times daily., Disp: 1 each, Rfl: 3   Rimegepant Sulfate (NURTEC) 75 MG TBDP, Take 1 tablet by mouth daily as needed (migraine). (Patient not taking: Reported on 09/05/2021), Disp: 4 tablet, Rfl: 0   SUMAtriptan (IMITREX) 50 MG tablet, Take 1 tablet (50 mg total) by mouth daily as needed for Migraine (may repeat in 2 hours). (Patient not taking: Reported on 09/05/2021), Disp: 10 tablet, Rfl: 3   valACYclovir (VALTREX) 1000 MG tablet, Take 2 tablets (2,000 mg total) by mouth 2 (two) times daily. x1 day per flare. Repeat as needed for each fever blister flare. (Patient not taking: Reported on 09/05/2021), Disp: 20 tablet, Rfl: 0  Allergies  Allergen Reactions   Other Anaphylaxis and Hives    Bee stings   Shellfish Allergy Swelling   Penicillins Hives   Sulfa Antibiotics Hives   Prednisone     Pt stated, "It made me  feel slightly agitated. I could not sleep well"     ROS: Review of Systems Pertinent items noted in HPI and remainder of comprehensive ROS otherwise negative.    Physical exam BP 122/81    Pulse 69    Temp (!) 97 F (36.1 C)    Ht _0  (1.626 m)    Wt 160 lb 9.6 oz (72.8 kg)    SpO2 99%    BMI 27.57 kg/m  General appearance: alert, cooperative, appears stated age, and no distress Head: Normocephalic, without obvious abnormality, atraumatic Eyes: negative findings: lids and  lashes normal, conjunctivae and sclerae normal, corneas clear, and pupils equal, round, reactive to light and accomodation Ears: normal TM's and external ear canals both ears Nose: Nares normal. Septum midline. Mucosa normal. No drainage or sinus tenderness. Throat: lips, mucosa, and tongue normal; teeth and gums normal Neck: no adenopathy, supple, symmetrical, trachea midline, and thyroid not enlarged, symmetric, no tenderness/mass/nodules Back: symmetric, no curvature. ROM normal. No CVA tenderness. Lungs: clear to auscultation bilaterally Heart: regular rate and rhythm, S1, S2 normal, no murmur, click, rub or gallop Abdomen: soft, non-tender; bowel sounds normal; no masses,  no organomegaly Extremities: extremities normal, atraumatic, no cyanosis or edema Pulses: 2+ and symmetric Skin: Skin color, texture, turgor normal. No rashes or lesions Lymph nodes: Cervical, supraclavicular, and axillary nodes normal. Neurologic: Grossly normal Psych: Mood stable, speech normal  Depression screen [facility] 2/9 09/05/2021 08/14/2021 08/11/2020  Decreased Interest 1 1 0  Down, Depressed, Hopeless 1 0 0  PHQ - 2 Score 2 1 0  Altered sleeping _0 Tired, decreased energy _1 Change in appetite 1 0 -  Feeling bad or failure about yourself  0 0 0  Trouble concentrating 0 0 0  Moving slowly or fidgety/restless 0 0 0  Suicidal thoughts 0 0 0  PHQ-9 Score _2 Difficult doing work/chores Not difficult at all Not difficult at  all -  Some recent data might be hidden   GAD 7 : Generalized Anxiety Score 09/05/2021 08/14/2021 08/11/2020 08/11/2020  Nervous, Anxious, on Edge _3 Control/stop worrying 1 1 0 0  Worry too much - different things 1 0 0 0  Trouble relaxing 1 0 0 0  Restless 1 0 0 0  Easily annoyed or irritable 0 _4 Afraid - awful might happen 0 0 0 0  Total GAD 7 Score _5 Anxiety Difficulty Not difficult at all Not difficult at all - -    Assessment/ Plan: Makayla Jackson here for annual physical exam.   Annual physical exam  Encounter for weight loss counseling  Overweight (BMI 25.0-29.9) - Plan: Semaglutide-Weight Management (WEGOVY) 0.5 MG/0.5ML SOAJ  Pure hypercholesterolemia - Plan: LDL Cholesterol, Direct, LDL Cholesterol, Direct, Semaglutide-Weight Management (WEGOVY) 0.5 MG/0.5ML SOAJ  Anxiety - Plan: ALPRAZolam (XANAX) 0.25 MG tablet  Controlled substance agreement signed - Plan: ToxASSURE Select 13 (MW), Urine  Hyperthyroidism - Plan: TSH, T4, Free, CMP14+EGFR, CMP14+EGFR, T4, Free, TSH  Mild intermittent reactive airway disease without complication - Plan: fluticasone (FLOVENT HFA) 110 MCG/ACT inhaler  Fever blister - Plan: valACYclovir (VALTREX) 1000 MG tablet  We will plan for shingles vaccination at next visit  We discussed options for weight management today.  Given her hyperlipidemia, I do think that she is in high risk category for cardiovascular disease.  For this reason we will proceed with [facility].  Instruction for use discussed.  First injection provided today.  Anxiety is chronic and intermittently flared.  Alprazolam renewed.  Continue to use sparingly.  National narcotic database reviewed and there were no red flags.  She was updated on CSA and UDS today  She has history of hyperthyroidism status post radioactive ablation.  Check thyroid levels.  Flovent and albuterol inhaler renewed.  Rare flareups of fever blisters but will renew her  Valtrex   Counseled on healthy lifestyle choices, including diet (rich in fruits, vegetables and lean meats and low in salt and simple carbohydrates) and exercise (at least 30  minutes of moderate physical activity daily).  Makayla Perkey M. Lajuana Ripple, DO

## 2021-09-06 ENCOUNTER — Telehealth: Payer: Self-pay | Admitting: *Deleted

## 2021-09-06 ENCOUNTER — Encounter: Payer: Self-pay | Admitting: Family Medicine

## 2021-09-06 LAB — CMP14+EGFR
ALT: 15 IU/L (ref 0–32)
AST: 26 IU/L (ref 0–40)
Albumin/Globulin Ratio: 2 (ref 1.2–2.2)
Albumin: 4.7 g/dL (ref 3.8–4.9)
Alkaline Phosphatase: 79 IU/L (ref 44–121)
BUN/Creatinine Ratio: 15 (ref 9–23)
BUN: 12 mg/dL (ref 6–24)
Bilirubin Total: <0.2 mg/dL (ref 0.0–1.2)
CO2: 26 mmol/L (ref 20–29)
Calcium: 10.1 mg/dL (ref 8.7–10.2)
Chloride: 102 mmol/L (ref 96–106)
Creatinine, Ser: 0.79 mg/dL (ref 0.57–1.00)
Globulin, Total: 2.4 g/dL (ref 1.5–4.5)
Glucose: 86 mg/dL (ref 70–99)
Potassium: 4.2 mmol/L (ref 3.5–5.2)
Sodium: 142 mmol/L (ref 134–144)
Total Protein: 7.1 g/dL (ref 6.0–8.5)
eGFR: 89 mL/min/{1.73_m2} (ref >59)

## 2021-09-06 LAB — TSH: TSH: 4.37 u[IU]/mL (ref 0.450–4.500)

## 2021-09-06 LAB — LDL CHOLESTEROL, DIRECT: LDL Direct: 84 mg/dL (ref 0–99)

## 2021-09-06 LAB — T4, FREE: Free T4: 1.08 ng/dL (ref 0.82–1.77)

## 2021-09-06 NOTE — Telephone Encounter (Signed)
PA for [employer]  (KeyRhona Leavens) [phone] CBIPJR 0.5MG /0.5ML auto-injectors

## 2021-09-07 ENCOUNTER — Other Ambulatory Visit: Payer: Self-pay | Admitting: Family Medicine

## 2021-09-07 DIAGNOSIS — Z1231 Encounter for screening mammogram for malignant neoplasm of breast: Secondary | ICD-10-CM

## 2021-09-07 NOTE — Telephone Encounter (Signed)
Your prior authorization for Mancel Parsons has been approved! MORE INFO Personalized support and financial assistance may be available through the [employer] program. For more information, and to see program requirements, click on the More Info button to the right.  Message from plan: Your PA request has been approved. Additional information will be provided in the approval communication. (Message 1145)   [address]

## 2021-09-10 LAB — TOXASSURE SELECT 13 (MW), URINE

## 2021-09-11 ENCOUNTER — Ambulatory Visit
Admission: RE | Admit: 2021-09-11 | Discharge: 2021-09-11 | Disposition: A | Discharge status: Home / Self Care | Payer: BC Managed Care – PPO | Source: Ambulatory Visit | Attending: Family Medicine | Admitting: Family Medicine

## 2021-09-11 DIAGNOSIS — Z1231 Encounter for screening mammogram for malignant neoplasm of breast: Secondary | ICD-10-CM

## 2021-10-09 ENCOUNTER — Ambulatory Visit (INDEPENDENT_AMBULATORY_CARE_PROVIDER_SITE_OTHER): Payer: BC Managed Care – PPO | Admitting: Family Medicine

## 2021-10-09 ENCOUNTER — Encounter: Payer: Self-pay | Admitting: Family Medicine

## 2021-10-09 DIAGNOSIS — J019 Acute sinusitis, unspecified: Secondary | ICD-10-CM

## 2021-10-09 DIAGNOSIS — B9689 Other specified bacterial agents as the cause of diseases classified elsewhere: Secondary | ICD-10-CM | POA: Diagnosis not present

## 2021-10-09 MED ORDER — DOXYCYCLINE HYCLATE 100 MG PO TABS: 100.0000 mg | ORAL_TABLET | Freq: Two times a day (BID) | ORAL | 0 refills | Status: AC

## 2021-10-09 NOTE — Progress Notes (Signed)
Telephone visit ? ?Subjective: ?CC: sinusitis ?PCP: Janora Norlander, DO ?Makayla Jackson is a 56 y.o. female calls for telephone consult today. Patient provides verbal consent for consult held via phone. ? ?Due to COVID-19 pandemic this visit was conducted virtually. This visit type was conducted due to national recommendations for restrictions regarding the COVID-19 Pandemic (e.g. social distancing, sheltering in place) in an effort to limit this patient's exposure and mitigate transmission in our community. All issues noted in this document were discussed and addressed.  A physical exam was not performed with this format.  ? ?Location of patient: work ?Location of provider: [facility] ?Others present for call: none ? ?1. Sinusitis ?Patient reports that she has continued to have green, yellow thick mucus from her sinuses.  She has been treating herself as it were allergies but it will not resolve.  Her ears hurt now.  She reports productive cough. No brown or bloody sputum.  She has had subjective/ low grade temperature. She has been using her navage and symptoms just aren't getting better. ? ? ?ROS: Per HPI ? ?Allergies  ?Allergen Reactions  ? Other Anaphylaxis and Hives  ?  Bee stings  ? Shellfish Allergy Swelling  ? Penicillins Hives  ? Sulfa Antibiotics Hives  ? Prednisone   ?  Pt stated, "It made me feel slightly agitated. I could not sleep well"  ? ?Past Medical History:  ?Diagnosis Date  ? Arthritis   ? Asthma   ? Bowel obstruction ([place]) 08/2016  ? Diverticulitis   ? Miscarriage   ? Multiple thyroid nodules   ? Osteopenia   ? Parathyroid tumor   ? Pneumonia   ? SBO (small bowel obstruction) ([place])   ? Thyroid disease   ? ? ?Current Outpatient Medications:  ?  acetaminophen (TYLENOL) 325 MG tablet, Take 650 mg by mouth every 6 (six) hours., Disp: , Rfl:  ?  albuterol (VENTOLIN HFA) 108 (90 Base) MCG/ACT inhaler, Inhale 2 puffs into the lungs every 6 (six) hours as needed for wheezing or shortness of breath.,  Disp: 8 g, Rfl: 0 ?  ALPRAZolam (XANAX) 0.25 MG tablet, TAKE 0.5 to 1 TABLET(0.25 MG) BY MOUTH qhs AS NEEDED FOR SLEEP OR ANXIETY, Disp: 30 tablet, Rfl: 1 ?  cetirizine (ZYRTEC) 10 MG tablet, Take 10 mg by mouth daily., Disp: , Rfl:  ?  fluticasone (FLOVENT HFA) 110 MCG/ACT inhaler, Inhale 2 puffs into the lungs 2 (two) times daily., Disp: 1 each, Rfl: 3 ?  Rimegepant Sulfate (NURTEC) 75 MG TBDP, Take 1 tablet by mouth daily as needed (migraine). (Patient not taking: Reported on 09/05/2021), Disp: 4 tablet, Rfl: 0 ?  Semaglutide-Weight Management (WEGOVY) 0.5 MG/0.5ML SOAJ, Inject 0.5 mg into the skin every 7 (seven) days. Start AFTER completing the 0.'25mg'$  sample, Disp: 2 mL, Rfl: 1 ?  SUMAtriptan (IMITREX) 50 MG tablet, Take 1 tablet (50 mg total) by mouth daily as needed for Migraine (may repeat in 2 hours). (Patient not taking: Reported on 09/05/2021), Disp: 10 tablet, Rfl: 3 ?  valACYclovir (VALTREX) 1000 MG tablet, Take 2 tablets (2,000 mg total) by mouth 2 (two) times daily. x1 day per flare. Repeat as needed for each fever blister flare., Disp: 20 tablet, Rfl: 0 ? ?Assessment/ Plan: ?56 y.o. female  ? ?Acute bacterial sinusitis - Plan: doxycycline (VIBRA-TABS) 100 MG tablet ? ?Doxycyline sent to pharmacy.  Home care instructions reviewed.  Follow up prn ? ?Start time: 12:11pm ?End time: 12:16pm ? ?Total time spent on patient  care (including telephone call/ virtual visit): 5 minutes ? ?Janora Norlander, DO ?[facility] ?[phone] ? ?

## 2021-11-30 ENCOUNTER — Encounter: Payer: Self-pay | Admitting: Family Medicine

## 2021-11-30 ENCOUNTER — Ambulatory Visit: Payer: BC Managed Care – PPO | Admitting: Family Medicine

## 2021-11-30 DIAGNOSIS — A6920 Lyme disease, unspecified: Secondary | ICD-10-CM

## 2021-11-30 DIAGNOSIS — W57XXXA Bitten or stung by nonvenomous insect and other nonvenomous arthropods, initial encounter: Secondary | ICD-10-CM

## 2021-11-30 DIAGNOSIS — S70362A Insect bite (nonvenomous), left thigh, initial encounter: Secondary | ICD-10-CM

## 2021-11-30 MED ORDER — DOXYCYCLINE HYCLATE 100 MG PO TABS: 100.0000 mg | ORAL_TABLET | Freq: Two times a day (BID) | ORAL | 0 refills | Status: DC

## 2021-11-30 NOTE — Progress Notes (Signed)
? ?  Virtual Visit  Note ?Due to COVID-19 pandemic this visit was conducted virtually. This visit type was conducted due to national recommendations for restrictions regarding the COVID-19 Pandemic (e.g. social distancing, sheltering in place) in an effort to limit this patient's exposure and mitigate transmission in our community. All issues noted in this document were discussed and addressed.  A physical exam was not performed with this format. ? ?I connected with Makayla Jackson on 11/30/21 at 1530 by telephone and verified that I am speaking with the correct person using two identifiers. Makayla Jackson is currently located at home and no one is currently with her during the visit. The provider, Gwenlyn Perking, FNP is located in their office at time of visit. ? ?I discussed the limitations, risks, security and privacy concerns of performing an evaluation and management service by telephone and the availability of in person appointments. I also discussed with the patient that there may be a patient responsible charge related to this service. The patient expressed understanding and agreed to proceed. ? ? ?History and Present Illness: ? ?HPI ?Nylan reports a tick bite in the crease on her left inner thigh. She removed the tick 2 nights ago. She had been doing yard work a few days prior, so she isn't sure how long the tick was attached. It was a deer tick. She reports that the bite has been itching. Today she noticed a rash to the area. She reports that the tick bite is red and raised. There is circle of white around that then redness that extends around the clearing 4-5 inches out. She also noticed a HA and knee pain yesterday. She denies drainage, fever, chills, weakness, confusion, nausea, or vomiting. Also reports some neck pain but states this is not unusual for her.  ? ? ? ?ROS ?As per HPI.  ? ?Observations/Objective: ?Alert and oriented x 3. Able to speak in full sentences without difficulty.  ? ?Assessment and  Plan: ?Diagnoses and all orders for this visit: ? ?Erythema migrans (Lyme disease) ?Tick bite of left thigh, initial encounter ?Report rash with central clearing consistent with erythema migrans. Also reports mild headache and joint pain yesterday. Will treat with doxycycline as below. Patient has appt with PCP next week.  ?-     doxycycline (VIBRA-TABS) 100 MG tablet; Take 1 tablet (100 mg total) by mouth 2 (two) times daily for 14 days. 1 po bid ? ? ? ? ?Follow Up Instructions: ?Return to office for new or worsening symptoms, or if symptoms persist.  ? ?  ?I discussed the assessment and treatment plan with the patient. The patient was provided an opportunity to ask questions and all were answered. The patient agreed with the plan and demonstrated an understanding of the instructions. ?  ?The patient was advised to call back or seek an in-person evaluation if the symptoms worsen or if the condition fails to improve as anticipated. ? ?The above assessment and management plan was discussed with the patient. The patient verbalized understanding of and has agreed to the management plan. Patient is aware to call the clinic if symptoms persist or worsen. Patient is aware when to return to the clinic for a follow-up visit. Patient educated on when it is appropriate to go to the emergency department.  ? ?Time call ended:  1543 ? ?I provided 13 minutes of  non face-to-face time during this encounter. ? ? ? ?Gwenlyn Perking, FNP ? ? ?

## 2021-11-30 NOTE — Patient Instructions (Signed)
Lyme Disease Lyme disease is an infection that can affect many parts of the body, including the skin, joints, and nervous system. It is a bacterial infection that starts from the bite of an infected tick. Over time, the infection can worsen, and some of the symptoms are similar to the flu. If Lyme disease is not treated, it may cause joint pain, swelling, numbness, problems thinking, fatigue, muscle weakness, and other problems. What are the causes? This condition is caused by bacteria called Borrelia burgdorferi. You can get Lyme disease by being bitten by an infected tick. Only black-legged, or Ixodes, ticks that are infected with the bacteria can cause Lyme disease. The tick must be attached to your skin for a certain period of time to pass along the infection. This is usually 36-48 hours. Deer often carry infected ticks. What increases the risk? The following factors may make you more likely to develop this condition: Living in or visiting these areas in the [place]. The [place] states. [place]. Spending time in wooded or grassy areas. Being outdoors with exposed skin. Camping, gardening, hiking, fishing, hunting, or working outdoors. Failing to remove a tick from your skin. What are the signs or symptoms? Symptoms of this condition may include: Chills and fever. Headache. Fatigue. General achiness. Muscle pain. Joint pain, often in the knees. A round, red rash that surrounds the center of the tick bite. The center of the rash may be blood colored or have tiny blisters. Swollen lymph glands. Stiff neck. How is this diagnosed? This condition is diagnosed based on: Your symptoms and medical history. A physical exam. A blood test. How is this treated? The main treatment for this condition is antibiotic medicine, which is usually taken by mouth (orally). The length of treatment depends on how soon after a tick bite you begin taking the medicine. In some  cases, treatment is necessary for several weeks. If the infection is severe, antibiotics may need to be given through an IV that is inserted into one of your veins. Follow these instructions at home: Take over-the-counter and prescription medicines only as told by your health care provider. Finish all antibiotic medicine, even when you start to feel better. Ask your health care provider about taking a probiotic in between doses of your antibiotic to help avoid an upset stomach or diarrhea. Check with your health care provider before supplementing your treatment. Many alternative therapies have not been proven and may be harmful to you. Keep all follow-up visits as told by your health care provider. This is important. How is this prevented? You can become reinfected if you get another tick bite from an infected tick. Take these steps to help prevent an infection: Cover your skin with light-colored clothing when you are outdoors in the spring and summer months. Spray clothing and skin with bug spray. The spray should be 20-30% DEET. You can also treat clothing with permethrin, and let it dry before you wear it. Do not apply permethrin directly to your skin. Permethrin can also be used to treat camping gear and boots. Always read and follow the instructions that come with a bug spray or insecticide. Avoid wooded, grassy, and shaded areas. Remove yard litter, brush, trash, and plants that attract deer and rodents. Check yourself for ticks when you come indoors. Wash clothing worn each day. Shower after spending time outdoors. Check your pets for ticks before they come inside. If you find a tick attached to your skin: Remove it with tweezers.   Clean your hands and the bite area with rubbing alcohol or soap and water. Dispose of the tick by putting it in rubbing alcohol, putting it in a sealed bag or container, or flushing it down the toilet. You may choose to save the tick in a sealed container if you  wish for it to be tested at a later time. Pregnant women should take special care to avoid tick bites because it is possible that the infection may be passed along to the fetus. Contact a health care provider if: You have symptoms after treatment. You have removed a tick and want to bring it to your health care provider for testing. Get help right away if: You have an irregular heartbeat. You have chest pain. You have nerve pain. Your face feels numb. You develop the following: A stiff neck. A severe headache. Severe nausea and vomiting. Sensitivity to light. Summary Lyme disease is an infection that can affect many parts of the body, including the skin, joints, and nervous system. This condition is caused by bacteria called Borrelia burgdorferi. You can get Lyme disease by being bitten by an infected tick. The main treatment for this condition is antibiotic medicine. This information is not intended to replace advice given to you by your health care provider. Make sure you discuss any questions you have with your health care provider. Document Revised: 11/14/2018 Document Reviewed: 10/09/2018 Elsevier Patient Education  2023 Elsevier Inc.  

## 2021-12-05 ENCOUNTER — Encounter: Payer: Self-pay | Admitting: Family Medicine

## 2021-12-05 ENCOUNTER — Ambulatory Visit (INDEPENDENT_AMBULATORY_CARE_PROVIDER_SITE_OTHER): Payer: BC Managed Care – PPO | Admitting: Family Medicine

## 2021-12-05 VITALS — BP 119/82 | HR 80 | Temp 97.3°F | Ht 64.0 in | Wt 151.2 lb

## 2021-12-05 DIAGNOSIS — L03116 Cellulitis of left lower limb: Secondary | ICD-10-CM | POA: Diagnosis not present

## 2021-12-05 DIAGNOSIS — R519 Headache, unspecified: Secondary | ICD-10-CM

## 2021-12-05 DIAGNOSIS — E663 Overweight: Secondary | ICD-10-CM

## 2021-12-05 DIAGNOSIS — W57XXXD Bitten or stung by nonvenomous insect and other nonvenomous arthropods, subsequent encounter: Secondary | ICD-10-CM

## 2021-12-05 DIAGNOSIS — S30860D Insect bite (nonvenomous) of lower back and pelvis, subsequent encounter: Secondary | ICD-10-CM | POA: Diagnosis not present

## 2021-12-05 DIAGNOSIS — R6889 Other general symptoms and signs: Secondary | ICD-10-CM

## 2021-12-05 DIAGNOSIS — E7800 Pure hypercholesterolemia, unspecified: Secondary | ICD-10-CM

## 2021-12-05 MED ORDER — FLUCONAZOLE 150 MG PO TABS: 150.0000 mg | ORAL_TABLET | Freq: Once | ORAL | 0 refills | Status: AC

## 2021-12-05 MED ORDER — KETOROLAC TROMETHAMINE 30 MG/ML IJ SOLN
30.0000 mg | Freq: Once | INTRAMUSCULAR | Status: AC
  Administered 2021-12-05: 30 mg via INTRAMUSCULAR

## 2021-12-05 MED ORDER — DOXYCYCLINE HYCLATE 100 MG PO TABS: 100.0000 mg | ORAL_TABLET | Freq: Two times a day (BID) | ORAL | 0 refills | Status: DC

## 2021-12-05 MED ORDER — WEGOVY 0.5 MG/0.5ML ~~LOC~~ SOAJ: 0.5000 mg | SUBCUTANEOUS | 3 refills | Status: DC

## 2021-12-05 NOTE — Progress Notes (Signed)
? ?Subjective: ?GN:FAOZ bite ?PCP: Janora Norlander, DO ?HYQ:MVHQI B Sherrard is a 56 y.o. female presenting to clinic today for: ? ?1. Tick bite ?Sustained a tick bite last week.  After about 2 days s/p removal, she developed fever, fatigue.  Started on Doxy 151m BID for suspected lyme disease.  About 1 day later, she developed extensive rash [facility] left [facility] her [facility].  She reports substantial improvement in rash.  Reports ongoing headache, flushing and fatigue.  Has had some upset stomach as well. No yeast vaginitis. Prophylacting with homeopathic remedies. ? ?2. Overweight/ HLD ?Compliant with wegovy except for the last week since she has been ill.  She reports good response.  Does not report any significant abdominal pain, bloating etc.  Wants to stick with the 0.5 as she is had almost a 10 pound weight loss with this so far.  May need refills.  Not fasting this morning but will come back in in 1 month for fasting labs ? ? ?ROS: Per HPI ? ?Allergies  ?Allergen Reactions  ? Other Anaphylaxis and Hives  ?  Bee stings  ? Shellfish Allergy Swelling  ? Penicillins Hives  ? Sulfa Antibiotics Hives  ? Prednisone   ?  Pt stated, "It made me feel slightly agitated. I could not sleep well"  ? ?Past Medical History:  ?Diagnosis Date  ? Arthritis   ? Asthma   ? [place]  ? Diverticulitis   ? Miscarriage   ? Multiple thyroid nodules   ? Osteopenia   ? Parathyroid tumor   ? Pneumonia   ? SBO (small bowel obstruction) ([facility]   ? Thyroid disease   ? ? ?Current Outpatient Medications:  ?  acetaminophen (TYLENOL) 325 MG tablet, Take 650 mg by mouth every 6 (six) hours., Disp: , Rfl:  ?  albuterol (VENTOLIN HFA) 108 (90 Base) MCG/ACT inhaler, Inhale 2 puffs into the lungs every 6 (six) hours as needed for wheezing or shortness of breath., Disp: 8 g, Rfl: 0 ?  ALPRAZolam (XANAX) 0.25 MG tablet, TAKE 0.5 to 1 TABLET(0.25 MG) BY MOUTH qhs AS NEEDED FOR SLEEP OR ANXIETY,  Disp: 30 tablet, Rfl: 1 ?  cephALEXin (KEFLEX) 500 MG capsule, Take 500 mg by mouth 4 (four) times daily., Disp: , Rfl:  ?  cetirizine (ZYRTEC) 10 MG tablet, Take 10 mg by mouth daily., Disp: , Rfl:  ?  doxycycline (VIBRA-TABS) 100 MG tablet, Take 1 tablet (100 mg total) by mouth 2 (two) times daily for 14 days. 1 po bid, Disp: 28 tablet, Rfl: 0 ?  fluticasone (FLOVENT HFA) 110 MCG/ACT inhaler, Inhale 2 puffs into the lungs 2 (two) times daily., Disp: 1 each, Rfl: 3 ?  Semaglutide-Weight Management (WEGOVY) 0.5 MG/0.5ML SOAJ, Inject 0.5 mg into the skin every 7 (seven) days. Start AFTER completing the 0.243msample, Disp: 2 mL, Rfl: 1 ?  valACYclovir (VALTREX) 1000 MG tablet, Take 2 tablets (2,000 mg total) by mouth 2 (two) times daily. x1 day per flare. Repeat as needed for each fever blister flare., Disp: 20 tablet, Rfl: 0 ?  Rimegepant Sulfate (NURTEC) 75 MG TBDP, Take 1 tablet by mouth daily as needed (migraine). (Patient not taking: Reported on 09/05/2021), Disp: 4 tablet, Rfl: 0 ?  SUMAtriptan (IMITREX) 50 MG tablet, Take 1 tablet (50 mg total) by mouth daily as needed for Migraine (may repeat in 2 hours). (Patient not taking: Reported on 09/05/2021), Disp: 10 tablet,  Rfl: 3 ?Social History  ? ?Socioeconomic History  ? Marital status: Married  ?  Spouse name: Not on file  ? Number of children: 3  ? Years of education: Not on file  ? Highest education level: Not on file  ?Occupational History  ? Occupation: Pharmacist, hospital  ?  Employer: [employer]  ?Tobacco Use  ? Smoking status: Never  ? Smokeless tobacco: Never  ?Vaping Use  ? Vaping Use: Never used  ?Substance and Sexual Activity  ? Alcohol use: Yes  ?  Alcohol/week: 1.0 standard drink  ?  Types: 1 Glasses of wine per week  ?  Comment: occasional glass of wine   ? Drug use: No  ? Sexual activity: Not on file  ?Other Topics Concern  ? Not on file  ?Social History Narrative  ? The patient is married.  Her husband is retiring as Occupational hygienist of [employer] after serving in [place].  ? She has 3 children  ? 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 Drinks 2 glasses of wine daily, no tobacco no drug use  ? ?Social Determinants of Health  ? ?Financial Resource Strain: Not on file  ?Food Insecurity: Not on file  ?Transportation Needs: Not on file  ?Physical Activity: Not on file  ?Stress: Not on file  ?Social Connections: Not on file  ?Intimate Partner Violence: Not on file  ? ?Family History  ?Problem Relation Age of Onset  ? Arthritis Mother   ? Arthritis/Rheumatoid Mother   ? Asthma Mother   ? Heart disease Mother   ? Hypertension Mother   ? Stroke Mother   ? Vision loss Mother   ? Thyroid disease Mother   ? Colon polyps Mother   ? Thyroid cancer Mother   ? Arthritis Father   ? Heart disease Father   ? Hypertension Father   ? Colon polyps Father   ? Diverticulitis Father   ?     with colon surgery  ? Arthritis/Rheumatoid Maternal Aunt   ? Arthritis/Rheumatoid Maternal Uncle   ? Diabetes Maternal Grandmother   ? Lung cancer Maternal Grandfather   ? Cancer Paternal Grandfather   ?     type unknown  ? Colon cancer Cousin   ? Thyroid cancer Cousin   ? Breast cancer Cousin   ? ? ?Objective: ?Office vital signs reviewed. ?BP 119/82   Pulse 80   Temp (!) 97.3 ?F (36.3 ?C)   Ht '5\' 4"'  (1.626 m)   Wt 151 lb 3.2 oz (68.6 kg)   SpO2 99%   BMI 25.95 kg/m?  ? ?Physical Examination:  ?General: Awake, alert, well nourished, No acute distress ?HEENT: sclera white, MMM ?Cardio: regular rate and rhythm  ?Pulm: normal WOB on room air ?MSK: normal gait and station ?Skin: small area of warmth, erythema and induration in the left groin region. No discharge.  No ongoing erythema or warmth down left thigh. ? ?Assessment/ Plan: ?56 y.o. female  ? ?Tick bite of pelvic region, subsequent encounter - Plan: Lyme Disease Serology w/Reflex, ketorolac (TORADOL) 30 MG/ML injection 30 mg, doxycycline (VIBRA-TABS) 100 MG tablet, CBC ? ?Suspected Lyme disease - Plan: Lyme Disease  Serology w/Reflex, ketorolac (TORADOL) 30 MG/ML injection 30 mg, doxycycline (VIBRA-TABS) 100 MG tablet, CBC ? ?Cellulitis of left thigh ? ?Acute nonintractable headache, unspecified headache type - Plan: ketorolac (TORADOL) 30 MG/ML injection 30 mg ? ?Overweight (BMI 25.0-29.9) - Plan: Lipid panel, CMP14+EGFR, Semaglutide-Weight Management (WEGOVY) 0.5 MG/0.5ML SOAJ ? ?Pure  hypercholesterolemia - Plan: Semaglutide-Weight Management (WEGOVY) 0.5 MG/0.5ML SOAJ ? ?Agree with previous assessment.  Likely lyme disease.  Serology ordered but discussed can take several weeks before shows up on blood work.  Finish Doxy.  2 week extension provided if symptoms return after completion.  Discussed prophylactic Doxy dosing today.  Diflucan sent as well prn.  Continue homeopathic remedies for now. ? ?Cellulitis has resolved.  Continue Keflex.  ? ?Toradol given for headache.  Ok to use Tylenol if needed ? ?Having a good response to [facility]. Down 9lbs.  Continue 0.42m for now.  She wasn't ready to advance yet.  Plan for fasting lipid and CMP with lyme titer in 1 month. ? ? ?No orders of the defined types were placed in this encounter. ? ?No orders of the defined types were placed in this encounter. ? ? ? ?AJanora Norlander DO ?[place]?([phone] ? ?

## 2021-12-05 NOTE — Patient Instructions (Signed)
Lyme Disease Lyme disease is an infection that can affect many parts of the body, including the skin, joints, and nervous system. It is a bacterial infection that starts from the bite of an infected tick. Over time, the infection can worsen, and some of the symptoms are similar to the flu. If Lyme disease is not treated, it may cause joint pain, swelling, numbness, problems thinking, fatigue, muscle weakness, and other problems. What are the causes? This condition is caused by bacteria called Borrelia burgdorferi. You can get Lyme disease by being bitten by an infected tick. Only black-legged, or Ixodes, ticks that are infected with the bacteria can cause Lyme disease. The tick must be attached to your skin for a certain period of time to pass along the infection. This is usually 36-48 hours. Deer often carry infected ticks. What increases the risk? The following factors may make you more likely to develop this condition: Living in or visiting these areas in the [place]. The [place] states. [place]. Spending time in wooded or grassy areas. Being outdoors with exposed skin. Camping, gardening, hiking, fishing, hunting, or working outdoors. Failing to remove a tick from your skin. What are the signs or symptoms? Symptoms of this condition may include: Chills and fever. Headache. Fatigue. General achiness. Muscle pain. Joint pain, often in the knees. A round, red rash that surrounds the center of the tick bite. The center of the rash may be blood colored or have tiny blisters. Swollen lymph glands. Stiff neck. How is this diagnosed? This condition is diagnosed based on: Your symptoms and medical history. A physical exam. A blood test. How is this treated? The main treatment for this condition is antibiotic medicine, which is usually taken by mouth (orally). The length of treatment depends on how soon after a tick bite you begin taking the medicine. In some  cases, treatment is necessary for several weeks. If the infection is severe, antibiotics may need to be given through an IV that is inserted into one of your veins. Follow these instructions at home: Take over-the-counter and prescription medicines only as told by your health care provider. Finish all antibiotic medicine, even when you start to feel better. Ask your health care provider about taking a probiotic in between doses of your antibiotic to help avoid an upset stomach or diarrhea. Check with your health care provider before supplementing your treatment. Many alternative therapies have not been proven and may be harmful to you. Keep all follow-up visits as told by your health care provider. This is important. How is this prevented? You can become reinfected if you get another tick bite from an infected tick. Take these steps to help prevent an infection: Cover your skin with light-colored clothing when you are outdoors in the spring and summer months. Spray clothing and skin with bug spray. The spray should be 20-30% DEET. You can also treat clothing with permethrin, and let it dry before you wear it. Do not apply permethrin directly to your skin. Permethrin can also be used to treat camping gear and boots. Always read and follow the instructions that come with a bug spray or insecticide. Avoid wooded, grassy, and shaded areas. Remove yard litter, brush, trash, and plants that attract deer and rodents. Check yourself for ticks when you come indoors. Wash clothing worn each day. Shower after spending time outdoors. Check your pets for ticks before they come inside. If you find a tick attached to your skin: Remove it with tweezers.   Clean your hands and the bite area with rubbing alcohol or soap and water. Dispose of the tick by putting it in rubbing alcohol, putting it in a sealed bag or container, or flushing it down the toilet. You may choose to save the tick in a sealed container if you  wish for it to be tested at a later time. Pregnant women should take special care to avoid tick bites because it is possible that the infection may be passed along to the fetus. Contact a health care provider if: You have symptoms after treatment. You have removed a tick and want to bring it to your health care provider for testing. Get help right away if: You have an irregular heartbeat. You have chest pain. You have nerve pain. Your face feels numb. You develop the following: A stiff neck. A severe headache. Severe nausea and vomiting. Sensitivity to light. Summary Lyme disease is an infection that can affect many parts of the body, including the skin, joints, and nervous system. This condition is caused by bacteria called Borrelia burgdorferi. You can get Lyme disease by being bitten by an infected tick. The main treatment for this condition is antibiotic medicine. This information is not intended to replace advice given to you by your health care provider. Make sure you discuss any questions you have with your health care provider. Document Revised: 11/14/2018 Document Reviewed: 10/09/2018 Elsevier Patient Education  2023 Elsevier Inc.  

## 2021-12-19 ENCOUNTER — Encounter: Payer: Self-pay | Admitting: Family Medicine

## 2021-12-19 ENCOUNTER — Ambulatory Visit: Payer: BC Managed Care – PPO | Admitting: Family Medicine

## 2021-12-19 VITALS — BP 118/82 | HR 78 | Temp 98.9°F | Ht 64.0 in | Wt 149.0 lb

## 2021-12-19 DIAGNOSIS — W57XXXD Bitten or stung by nonvenomous insect and other nonvenomous arthropods, subsequent encounter: Secondary | ICD-10-CM

## 2021-12-19 DIAGNOSIS — L03116 Cellulitis of left lower limb: Secondary | ICD-10-CM

## 2021-12-19 DIAGNOSIS — S30860D Insect bite (nonvenomous) of lower back and pelvis, subsequent encounter: Secondary | ICD-10-CM

## 2021-12-19 MED ORDER — DOXYCYCLINE HYCLATE 100 MG PO TABS: 100.0000 mg | ORAL_TABLET | Freq: Two times a day (BID) | ORAL | 0 refills | Status: DC

## 2021-12-19 MED ORDER — DOXYCYCLINE HYCLATE 100 MG PO TABS: 100.0000 mg | ORAL_TABLET | Freq: Two times a day (BID) | ORAL | 0 refills | Status: AC

## 2021-12-19 NOTE — Progress Notes (Signed)
?  ? ?Subjective:  ?Patient ID: Makayla Jackson, female    DOB: 04-23-1966, 56 y.o.   MRN: [id number] ? ?Patient Care Team: ?Janora Norlander, DO as PCP - General (Family Medicine)  ? ?Chief Complaint:  cellulitis flare up ? ? ?HPI: ?Makayla Jackson is a 56 y.o. female presenting on 12/19/2021 for cellulitis flare up ? ? ?Pt presents today for cellulitis and tick bite follow up. She has a tick bite 11/30/2021 and was treated with Keflex and then doxycycline for 14 days. She states the erythema initially improved but has since returned. She has erythema and tenderness to her left inner thigh. She denies fever but does report chills and myalgias.  ? ? ? ? ?Relevant past medical, surgical, family, and social history reviewed and updated as indicated.  ?Allergies and medications reviewed and updated. Data reviewed: Chart in Epic. ? ? ?Past Medical History:  ?Diagnosis Date  ? Arthritis   ? Asthma   ? Bowel obstruction ([place]) 08/2016  ? Diverticulitis   ? Miscarriage   ? Multiple thyroid nodules   ? Osteopenia   ? Parathyroid tumor   ? Pneumonia   ? SBO (small bowel obstruction) ([place])   ? Thyroid disease   ? ? ?Past Surgical History:  ?Procedure Laterality Date  ? ABDOMINAL HYSTERECTOMY    ? COLON SURGERY  2014  ? Diverticulosis resection, after diverticulitis  ? FINE NEEDLE ASPIRATION    ? Thyroid Nodule  ? KNEE ARTHROSCOPY Right   ? NASAL SINUS SURGERY  1998  ? PARATHYROIDECTOMY  12/2014  ? PARTIAL HYSTERECTOMY  2010  ? TONSILLECTOMY    ? ? ?Social History  ? ?Socioeconomic History  ? Marital status: Married  ?  Spouse name: Not on file  ? Number of children: 3  ? Years of education: Not on file  ? Highest education level: Not on file  ?Occupational History  ? Occupation: Pharmacist, hospital  ?  Employer: [employer]  ?Tobacco Use  ? Smoking status: Never  ? Smokeless tobacco: Never  ?Vaping Use  ? Vaping Use: Never used  ?Substance and Sexual Activity  ? Alcohol use: Yes  ?  Alcohol/week: 1.0 standard drink  ?  Types:  1 Glasses of wine per week  ?  Comment: occasional glass of wine   ? Drug use: No  ? Sexual activity: Not on file  ?Other Topics Concern  ? Not on file  ?Social History Narrative  ? The patient is married.  Her husband is retiring as Occupational hygienist of [employer] after serving in [place].  ? She has 3 children  ? 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 Drinks 2 glasses of wine daily, no tobacco no drug use  ? ?Social Determinants of Health  ? ?Financial Resource Strain: Not on file  ?Food Insecurity: Not on file  ?Transportation Needs: Not on file  ?Physical Activity: Not on file  ?Stress: Not on file  ?Social Connections: Not on file  ?Intimate Partner Violence: Not on file  ? ? ?Outpatient Encounter Medications as of 12/19/2021  ?Medication Sig  ? acetaminophen (TYLENOL) 325 MG tablet Take 650 mg by mouth every 6 (six) hours.  ? albuterol (VENTOLIN HFA) 108 (90 Base) MCG/ACT inhaler Inhale 2 puffs into the lungs every 6 (six) hours as needed for wheezing or shortness of breath.  ? ALPRAZolam (XANAX) 0.25 MG tablet TAKE 0.5 to 1 TABLET(0.25 MG) BY MOUTH qhs AS NEEDED FOR SLEEP OR ANXIETY  ?  cetirizine (ZYRTEC) 10 MG tablet Take 10 mg by mouth daily.  ? fluticasone (FLOVENT HFA) 110 MCG/ACT inhaler Inhale 2 puffs into the lungs 2 (two) times daily.  ? Rimegepant Sulfate (NURTEC) 75 MG TBDP Take 1 tablet by mouth daily as needed (migraine).  ? Semaglutide-Weight Management (WEGOVY) 0.5 MG/0.5ML SOAJ Inject 0.5 mg into the skin every 7 (seven) days.  ? SUMAtriptan (IMITREX) 50 MG tablet Take 1 tablet (50 mg total) by mouth daily as needed for Migraine (may repeat in 2 hours).  ? valACYclovir (VALTREX) 1000 MG tablet Take 2 tablets (2,000 mg total) by mouth 2 (two) times daily. x1 day per flare. Repeat as needed for each fever blister flare.  ? [DISCONTINUED] doxycycline (VIBRA-TABS) 100 MG tablet Take 1 tablet (100 mg total) by mouth 2 (two) times daily for 14 days. 1 po bid  ? cephALEXin (KEFLEX) 500 MG  capsule Take 500 mg by mouth 4 (four) times daily. (Patient not taking: Reported on 12/19/2021)  ? doxycycline (VIBRA-TABS) 100 MG tablet Take 1 tablet (100 mg total) by mouth 2 (two) times daily for 14 days. 1 po bid  ? [DISCONTINUED] doxycycline (VIBRA-TABS) 100 MG tablet Take 1 tablet (100 mg total) by mouth 2 (two) times daily for 14 days. 1 po bid  ? ?No facility-administered encounter medications on file as of 12/19/2021.  ? ? ?Allergies  ?Allergen Reactions  ? Other Anaphylaxis and Hives  ?  Bee stings  ? Shellfish Allergy Swelling  ? Penicillins Hives  ? Sulfa Antibiotics Hives  ? Prednisone   ?  Pt stated, "It made me feel slightly agitated. I could not sleep well"  ? ? ?Review of Systems  ?Constitutional:  Positive for chills. Negative for activity change, appetite change, diaphoresis, fatigue, fever and unexpected weight change.  ?Respiratory:  Negative for cough and shortness of breath.   ?Cardiovascular:  Negative for chest pain, palpitations and leg swelling.  ?Gastrointestinal:  Negative for abdominal pain, constipation, diarrhea, nausea and vomiting.  ?Genitourinary:  Negative for decreased urine volume and difficulty urinating.  ?Musculoskeletal:  Positive for myalgias. Negative for arthralgias, back pain, gait problem, joint swelling, neck pain and neck stiffness.  ?Skin:  Positive for color change, rash and wound. Negative for pallor.  ?Neurological:  Negative for dizziness, tremors, seizures, syncope, facial asymmetry, speech difficulty, weakness, light-headedness, numbness and headaches.  ?Psychiatric/Behavioral:  Negative for confusion.   ?All other systems reviewed and are negative. ? ?   ? ?Objective:  ?BP 118/82   Pulse 78   Temp 98.9 ?F (37.2 ?C)   Ht '5\' 4"'  (1.626 m)   Wt 149 lb (67.6 kg)   SpO2 98%   BMI 25.58 kg/m?   ? ?Wt Readings from Last 3 Encounters:  ?12/19/21 149 lb (67.6 kg)  ?12/05/21 151 lb 3.2 oz (68.6 kg)  ?09/05/21 160 lb 9.6 oz (72.8 kg)  ? ? ?Physical Exam ?Vitals and  nursing note reviewed.  ?Constitutional:   ?   General: She is not in acute distress. ?   Appearance: Normal appearance. She is not ill-appearing, toxic-appearing or diaphoretic.  ?HENT:  ?   Head: Normocephalic and atraumatic.  ?   Mouth/Throat:  ?   Mouth: Mucous membranes are moist.  ?Eyes:  ?   Pupils: Pupils are equal, round, and reactive to light.  ?Cardiovascular:  ?   Rate and Rhythm: Normal rate and regular rhythm.  ?   Heart sounds: Normal heart sounds. No murmur heard. ?  No friction  rub. No gallop.  ?Pulmonary:  ?   Effort: Pulmonary effort is normal.  ?   Breath sounds: Normal breath sounds.  ?Skin: ?   General: Skin is warm and dry.  ?   Capillary Refill: Capillary refill takes less than 2 seconds.  ?   Findings: Erythema, rash and wound present.  ? ?    ?Neurological:  ?   General: No focal deficit present.  ?   Mental Status: She is alert and oriented to person, place, and time.  ?Psychiatric:     ?   Mood and Affect: Mood is anxious.     ?   Behavior: Behavior normal.     ?   Thought Content: Thought content normal.     ?   Judgment: Judgment normal.  ? ? ?Results for orders placed or performed in visit on 09/05/21  ?ToxASSURE Select 13 (MW), Urine  ?Result Value Ref Range  ? Summary Note   ?CMP14+EGFR  ?Result Value Ref Range  ? Glucose 86 70 - 99 mg/dL  ? BUN 12 6 - 24 mg/dL  ? Creatinine, Ser 0.79 0.57 - 1.00 mg/dL  ? eGFR 89 >59 mL/min/1.73  ? BUN/Creatinine Ratio 15 9 - 23  ? Sodium 142 134 - 144 mmol/L  ? Potassium 4.2 3.5 - 5.2 mmol/L  ? Chloride 102 96 - 106 mmol/L  ? CO2 26 20 - 29 mmol/L  ? Calcium 10.1 8.7 - 10.2 mg/dL  ? Total Protein 7.1 6.0 - 8.5 g/dL  ? Albumin 4.7 3.8 - 4.9 g/dL  ? Globulin, Total 2.4 1.5 - 4.5 g/dL  ? Albumin/Globulin Ratio 2.0 1.2 - 2.2  ? Bilirubin Total <0.2 0.0 - 1.2 mg/dL  ? Alkaline Phosphatase 79 44 - 121 IU/L  ? AST 26 0 - 40 IU/L  ? ALT 15 0 - 32 IU/L  ?T4, Free  ?Result Value Ref Range  ? Free T4 1.08 0.82 - 1.77 ng/dL  ?TSH  ?Result Value Ref Range  ? TSH  4.370 0.450 - 4.500 uIU/mL  ?LDL Cholesterol, Direct  ?Result Value Ref Range  ? LDL Direct 84 0 - 99 mg/dL  ? ?   ? ?Pertinent labs & imaging results that were available during my care of the patient were

## 2022-02-07 ENCOUNTER — Ambulatory Visit: Payer: BC Managed Care – PPO | Admitting: Podiatry

## 2022-02-07 ENCOUNTER — Ambulatory Visit (INDEPENDENT_AMBULATORY_CARE_PROVIDER_SITE_OTHER): Payer: BC Managed Care – PPO

## 2022-02-07 DIAGNOSIS — M7751 Other enthesopathy of right foot: Secondary | ICD-10-CM | POA: Diagnosis not present

## 2022-02-07 DIAGNOSIS — M722 Plantar fascial fibromatosis: Secondary | ICD-10-CM

## 2022-02-07 MED ORDER — TRIAMCINOLONE ACETONIDE 10 MG/ML IJ SUSP
20.0000 mg | Freq: Once | INTRAMUSCULAR | Status: AC
  Administered 2022-02-07: 20 mg

## 2022-02-07 MED ORDER — DICLOFENAC SODIUM 75 MG PO TBEC: 75.0000 mg | DELAYED_RELEASE_TABLET | Freq: Two times a day (BID) | ORAL | 2 refills | Status: DC

## 2022-02-07 NOTE — Progress Notes (Signed)
Subjective:   Patient ID: Makayla Jackson, female   DOB: 56 y.o.   MRN: [id number]   HPI Patient states she fell down the stairs a couple months ago and has been having problems with her right foot since and did not have a break when they took x-rays at that time and does have a history of chronic fasciitis bilateral that she works on herself and keeps under reasonably good control.  Patient does not smoke likes to be active   Review of Systems  All other systems reviewed and are negative.       Objective:  Physical Exam Vitals and nursing note reviewed.  Constitutional:      Appearance: She is well-developed.  Pulmonary:     Effort: Pulmonary effort is normal.  Musculoskeletal:        General: Normal range of motion.  Skin:    General: Skin is warm.  Neurological:     Mental Status: She is alert.     Neurovascular status intact muscle strength adequate range of motion adequate with discomfort in the sinus tarsi right with quite a bit of inflammation and fluid buildup with inflammation and pain around the fourth MPJ right that is sore and makes it hard to walk at times.  Patient's left foot appears to be doing okay but still sore and discomfort in the plantar heel of a mild nature bilateral.  Good digital perfusion well oriented x3     Assessment:  Acute capsulitis of the sinus tarsi right along with fourth MPJ inflammation capsulitis right with mild discomfort left plantar fascia     Plan:  H&P x-rays reviewed of both feet and for the right I did do sterile prep and injected the sinus tarsi 3 mg Kenalog 5 mg Xylocaine and for the right fourth MPJ I did do a proximal nerve block I aspirated the joint getting out a small amount of clear fluid injected with quarter cc dexamethasone Kenalog and advised on rigid bottom shoes.  Placed on oral anti-inflammatory and reappoint to recheck again in 2 weeks and applied fascial brace right to lift up the arch and take the pressure off the  plantar fascia and the subtalar joint  X-rays indicate that there is no signs of a fracture no signs of Lisfranc dislocation or other subtle pathology right over left foot with mild spurs of the plantar heel noted

## 2022-02-28 ENCOUNTER — Encounter: Payer: Self-pay | Admitting: Podiatry

## 2022-02-28 ENCOUNTER — Ambulatory Visit: Payer: BC Managed Care – PPO | Admitting: Podiatry

## 2022-02-28 DIAGNOSIS — M7751 Other enthesopathy of right foot: Secondary | ICD-10-CM | POA: Diagnosis not present

## 2022-02-28 NOTE — Progress Notes (Signed)
Subjective:   Patient ID: Makayla Jackson, female   DOB: 56 y.o.   MRN: [id number]   HPI Patient states she is improved but still having some forefoot pain right   ROS      Objective:  Physical Exam  Neurovascular status intact with patient's right ankle doing much better with mild discomfort still noted in the forefoot of the localized nature lesser MPJs     Assessment:  Low-grade capsulitis still present of the lesser MPJs right      Plan:  Recommended the continuation of offloading and dispensed metatarsal padding to take pressure off the joint surface instructed on usage and also on ice therapy and rigid bottom shoes.  Patient will be seen back to reevaluate

## 2022-04-24 ENCOUNTER — Ambulatory Visit: Payer: BC Managed Care – PPO | Admitting: Family Medicine

## 2022-04-24 ENCOUNTER — Encounter: Payer: Self-pay | Admitting: Family Medicine

## 2022-04-24 VITALS — BP 118/79 | HR 84 | Temp 98.1°F | Ht 64.0 in | Wt 150.2 lb

## 2022-04-24 DIAGNOSIS — F430 Acute stress reaction: Secondary | ICD-10-CM

## 2022-04-24 DIAGNOSIS — F419 Anxiety disorder, unspecified: Secondary | ICD-10-CM

## 2022-04-24 DIAGNOSIS — Z23 Encounter for immunization: Secondary | ICD-10-CM

## 2022-04-24 MED ORDER — ALPRAZOLAM 0.25 MG PO TABS: ORAL_TABLET | ORAL | 1 refills | Status: DC

## 2022-04-24 MED ORDER — MIRTAZAPINE 7.5 MG PO TABS: 7.5000 mg | ORAL_TABLET | Freq: Every day | ORAL | 0 refills | Status: DC

## 2022-04-24 NOTE — Progress Notes (Signed)
Subjective: CC: Anxiety PCP: Janora Norlander, DO STM:HDQQI B Ruotolo is a 56 y.o. female presenting to clinic today for:  1.  Anxiety and depression Patient reports a lot of stress surrounding the death of her brother, who was shot to death by his son.  There has been a lot of strain in the family since his death.  She is also dealing with the decline in her mother's health and ongoing stressors related to her marriage and substance use issue with her husband.  She has alprazolam on hand but tries uses sparingly.  She reports multiple panic attacks at work recently and admits that she does not take much time to self care.  She has not sought out counseling services but is strongly considering this.  She reports difficulty with sleeping both falling asleep and staying asleep.  She occasionally drinks wine but never more than 1 glass every other night.   ROS: Per HPI  Allergies  Allergen Reactions   Other Anaphylaxis and Hives    Bee stings   Shellfish Allergy Swelling   Penicillins Hives   Sulfa Antibiotics Hives   Prednisone     Pt stated, "It made me feel slightly agitated. I could not sleep well"   Past Medical History:  Diagnosis Date   Arthritis    Asthma    Bowel obstruction ([place]) 08/2016   Diverticulitis    Miscarriage    Multiple thyroid nodules    Osteopenia    Parathyroid tumor    Pneumonia    SBO (small bowel obstruction) (HCC)    Thyroid disease     Current Outpatient Medications:    acetaminophen (TYLENOL) 325 MG tablet, Take 650 mg by mouth every 6 (six) hours., Disp: , Rfl:    albuterol (VENTOLIN HFA) 108 (90 Base) MCG/ACT inhaler, Inhale 2 puffs into the lungs every 6 (six) hours as needed for wheezing or shortness of breath., Disp: 8 g, Rfl: 0   ALPRAZolam (XANAX) 0.25 MG tablet, TAKE 0.5 to 1 TABLET(0.25 MG) BY MOUTH qhs AS NEEDED FOR SLEEP OR ANXIETY, Disp: 30 tablet, Rfl: 1   cetirizine (ZYRTEC) 10 MG tablet, Take 10 mg by mouth daily., Disp: , Rfl:     fluticasone (FLOVENT HFA) 110 MCG/ACT inhaler, Inhale 2 puffs into the lungs 2 (two) times daily., Disp: 1 each, Rfl: 3   Rimegepant Sulfate (NURTEC) 75 MG TBDP, Take 1 tablet by mouth daily as needed (migraine)., Disp: 4 tablet, Rfl: 0   Semaglutide-Weight Management (WEGOVY) 0.5 MG/0.5ML SOAJ, Inject 0.5 mg into the skin every 7 (seven) days., Disp: 6 mL, Rfl: 3   SUMAtriptan (IMITREX) 50 MG tablet, Take 1 tablet (50 mg total) by mouth daily as needed for Migraine (may repeat in 2 hours)., Disp: 10 tablet, Rfl: 3   valACYclovir (VALTREX) 1000 MG tablet, Take 2 tablets (2,000 mg total) by mouth 2 (two) times daily. x1 day per flare. Repeat as needed for each fever blister flare., Disp: 20 tablet, Rfl: 0 Social History   Socioeconomic History   Marital status: Married    Spouse name: Not on file   Number of children: 3   Years of education: Not on file   Highest education level: Not on file  Occupational History   Occupation: Product manager: [employer]  Tobacco Use   Smoking status: Never   Smokeless tobacco: Never  Vaping Use   Vaping Use: Never used  Substance and Sexual Activity   Alcohol use:  Yes    Alcohol/week: 1.0 standard drink of alcohol    Types: 1 Glasses of wine per week    Comment: occasional glass of wine    Drug use: No   Sexual activity: Not on file  Other Topics Concern   Not on file  Social History Narrative   The patient is married.  Her husband is retiring as Occupational hygienist of [employer] after serving in [place].   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[place] 2 glasses of wine daily, no tobacco no drug use   Social Determinants of Sales executive: Not on file  Food Insecurity: Not on file  Transportation Needs: Not on file  Physical Activity: Not on file  Stress: Not on file  Social Connections: Not on file  Intimate Partner Violence: Not on file   Family History  Problem  Relation Age of Onset   Arthritis Mother    Arthritis/Rheumatoid Mother    Asthma Mother    Heart disease Mother    Hypertension Mother    Stroke Mother    Vision loss Mother    Thyroid disease Mother    Colon polyps Mother    Thyroid cancer Mother    Arthritis Father    Heart disease Father    Hypertension Father    Colon polyps Father    Diverticulitis Father        with colon surgery   Arthritis/Rheumatoid Maternal Aunt    Arthritis/Rheumatoid Maternal Uncle    Diabetes Maternal Grandmother    Lung cancer Maternal Grandfather    Cancer Paternal Grandfather        type unknown   Colon cancer Cousin    Thyroid cancer Cousin    Breast cancer Cousin     Objective: Office vital signs reviewed. BP 118/79   Pulse 84   Temp 98.1 F (36.7 C)   Ht '5\' 4"'$  (1.626 m)   Wt 150 lb 3.2 oz (68.1 kg)   SpO2 95%   BMI 25.78 kg/m   Physical Examination:  General: Awake, alert, well nourished, No acute distress Psych: Tearful, anxious appearing     04/24/2022    3:41 PM 12/19/2021   11:42 AM 12/05/2021    9:01 AM  Depression screen PHQ 2/9  Decreased Interest 1 1 0  Down, Depressed, Hopeless '3 1 1  '$ PHQ - 2 Score '4 2 1  '$ Altered sleeping '2 2 1  '$ Tired, decreased energy '2 2 2  '$ Change in appetite 1 0 1  Feeling bad or failure about yourself  2 0 0  Trouble concentrating 2 1 0  Moving slowly or fidgety/restless 0 0 0  Suicidal thoughts 0 0 0  PHQ-9 Score '13 7 5  '$ Difficult doing work/chores Very difficult Somewhat difficult Not difficult at all      04/24/2022    3:41 PM 12/19/2021   11:43 AM 12/05/2021    9:01 AM 09/05/2021    3:51 PM  GAD 7 : Generalized Anxiety Score  Nervous, Anxious, on Edge '2 2 1 2  '$ Control/stop worrying '2 1 1 1  '$ Worry too much - different things '2 1 1 1  '$ Trouble relaxing '2 1 1 1  '$ Restless 2 0 1 1  Easily annoyed or irritable '3 1 1 '$ 0  Afraid - awful might happen 1 2 0 0  Total GAD 7 Score '14 8 6 6  '$ Anxiety Difficulty Somewhat difficult  Somewhat  difficult Somewhat difficult Not difficult at all      Assessment/ Plan: 56 y.o. female   Acute stress reaction - Plan: ALPRAZolam (XANAX) 0.25 MG tablet, mirtazapine (REMERON) 7.5 MG tablet  Need for immunization against influenza - Plan: Flu Vaccine QUAD 82moIM (Fluarix, Fluzone & Alfiuria Quad PF)  Anxiety - Plan: ALPRAZolam (XANAX) 0.25 MG tablet, mirtazapine (REMERON) 7.5 MG tablet  Certainly having a lot of the stress and anxiety surrounding multiple traumas including the murder of her brother.  She has ongoing stress related to her mother's advancing health issues and family strains.  Continue alprazolam as needed panic but I am going to add mirtazapine nightly.  I encouraged her to seek counseling services.  She will let me know if assistance is needed with this.  I would like to reconvene in the next few weeks with her.  May need to consider extending her leave from work depending on how she is doing.  I have given her a written note excusing her for the next 2 weeks while she takes some time to recover and medicate.  She understands red flag signs and symptoms warranting further evaluation.  Follow-up as directed   The Narcotic Database has been reviewed.  There were no red flags.    Orders Placed This Encounter  Procedures   Flu Vaccine QUAD 654moM (Fluarix, Fluzone & Alfiuria Quad PF)   No orders of the defined types were placed in this encounter.  Total time spent with patient 32 minutes.  Greater than 50% of encounter spent in coordination of care/counseling.   AsJanora NorlanderDO [id number]

## 2022-04-24 NOTE — Patient Instructions (Signed)

## 2022-06-11 ENCOUNTER — Telehealth: Payer: BC Managed Care – PPO | Admitting: Family Medicine

## 2022-06-11 ENCOUNTER — Encounter: Payer: Self-pay | Admitting: Family Medicine

## 2022-06-11 DIAGNOSIS — J019 Acute sinusitis, unspecified: Secondary | ICD-10-CM | POA: Diagnosis not present

## 2022-06-11 DIAGNOSIS — B9689 Other specified bacterial agents as the cause of diseases classified elsewhere: Secondary | ICD-10-CM

## 2022-06-11 MED ORDER — DOXYCYCLINE HYCLATE 100 MG PO TABS: 100.0000 mg | ORAL_TABLET | Freq: Two times a day (BID) | ORAL | 0 refills | Status: DC

## 2022-06-11 NOTE — Progress Notes (Signed)
Virtual Visit via telephone Note  I connected with Makayla Jackson on 06/11/22 at 1259 by telephone and verified that I am speaking with the correct person using two identifiers. Makayla Jackson is currently located at home and patient are currently with her during visit. The provider, Fransisca Kaufmann Joeann Steppe, MD is located in their office at time of visit.  Call ended at 1305  I discussed the limitations, risks, security and privacy concerns of performing an evaluation and management service by telephone and the availability of in person appointments. I also discussed with the patient that there may be a patient responsible charge related to this service. The patient expressed understanding and agreed to proceed.   History and Present Illness: Patient is calling in for sinus pressure and it happens when the weather changes.  She has had it for 1 week.  She is now having fever and ear pressure.  She feels like crud. She had a history of sinus surgery and she gets this at this time of year.  She teaches elementary school.  She did a home covid test and it was negative. She is using mucinex-dm and navage and nasal saline.  She takes zyrtec everyday normal.  She is feeling hoarse.   1. Acute bacterial sinusitis     Outpatient Encounter Medications as of 06/11/2022  Medication Sig   doxycycline (VIBRA-TABS) 100 MG tablet Take 1 tablet (100 mg total) by mouth 2 (two) times daily. 1 po bid   acetaminophen (TYLENOL) 325 MG tablet Take 650 mg by mouth every 6 (six) hours.   albuterol (VENTOLIN HFA) 108 (90 Base) MCG/ACT inhaler Inhale 2 puffs into the lungs every 6 (six) hours as needed for wheezing or shortness of breath.   ALPRAZolam (XANAX) 0.25 MG tablet TAKE 0.5 to 1 TABLET(0.25 MG) BY MOUTH qhs AS NEEDED FOR SLEEP OR ANXIETY   cetirizine (ZYRTEC) 10 MG tablet Take 10 mg by mouth daily.   fluticasone (FLOVENT HFA) 110 MCG/ACT inhaler Inhale 2 puffs into the lungs 2 (two) times daily.   mirtazapine  (REMERON) 7.5 MG tablet Take 1 tablet (7.5 mg total) by mouth at bedtime.   Rimegepant Sulfate (NURTEC) 75 MG TBDP Take 1 tablet by mouth daily as needed (migraine).   Semaglutide-Weight Management (WEGOVY) 0.5 MG/0.5ML SOAJ Inject 0.5 mg into the skin every 7 (seven) days.   SUMAtriptan (IMITREX) 50 MG tablet Take 1 tablet (50 mg total) by mouth daily as needed for Migraine (may repeat in 2 hours).   valACYclovir (VALTREX) 1000 MG tablet Take 2 tablets (2,000 mg total) by mouth 2 (two) times daily. x1 day per flare. Repeat as needed for each fever blister flare.   No facility-administered encounter medications on file as of 06/11/2022.    Review of Systems  Constitutional:  Negative for chills and fever.  HENT:  Positive for congestion, postnasal drip, rhinorrhea, sinus pressure and sore throat. Negative for ear discharge, ear pain and sneezing.   Eyes:  Negative for pain, redness and visual disturbance.  Respiratory:  Positive for cough. Negative for chest tightness and shortness of breath.   Cardiovascular:  Negative for chest pain and leg swelling.  Genitourinary:  Negative for difficulty urinating and dysuria.  Musculoskeletal:  Negative for back pain and gait problem.  Skin:  Negative for rash.  Neurological:  Negative for light-headedness and headaches.  Psychiatric/Behavioral:  Negative for agitation and behavioral problems.   All other systems reviewed and are negative.   Observations/Objective: Patient sounds  comfortable   Assessment and Plan: Problem List Items Addressed This Visit   None Visit Diagnoses     Acute bacterial sinusitis    -  Primary   Relevant Medications   doxycycline (VIBRA-TABS) 100 MG tablet       Will treat with doxy and continue mucinex and navage.  Follow up plan: Return if symptoms worsen or fail to improve.     I discussed the assessment and treatment plan with the patient. The patient was provided an opportunity to ask questions and all  were answered. The patient agreed with the plan and demonstrated an understanding of the instructions.   The patient was advised to call back or seek an in-person evaluation if the symptoms worsen or if the condition fails to improve as anticipated.  The above assessment and management plan was discussed with the patient. The patient verbalized understanding of and has agreed to the management plan. Patient is aware to call the clinic if symptoms persist or worsen. Patient is aware when to return to the clinic for a follow-up visit. Patient educated on when it is appropriate to go to the emergency department.    I provided 6 minutes of non-face-to-face time during this encounter.  Patient was unable to get on video  Worthy Rancher, MD

## 2022-07-26 ENCOUNTER — Other Ambulatory Visit: Payer: Self-pay | Admitting: Family Medicine

## 2022-07-26 ENCOUNTER — Telehealth: Payer: Self-pay

## 2022-07-26 DIAGNOSIS — F419 Anxiety disorder, unspecified: Secondary | ICD-10-CM

## 2022-07-26 DIAGNOSIS — F430 Acute stress reaction: Secondary | ICD-10-CM

## 2022-07-26 NOTE — Telephone Encounter (Signed)
Pharmacy Patient Advocate Encounter   Received notification from [facility] that prior authorization for Wrgovy 0.'5mg'$ /0.66m is required/requested.     PA submitted on 07/26/22 to (ins) [employer]  via CoverMyMeds Key [id number] Status is pending

## 2022-07-27 ENCOUNTER — Other Ambulatory Visit (HOSPITAL_COMMUNITY): Payer: Self-pay

## 2022-07-27 NOTE — Telephone Encounter (Signed)
Patient Advocate Encounter  Prior Authorization for [employer] 0.'5mg'$ /0.58m  has been approved.     Effective dates: 07/26/22 through 07/27/23

## 2022-08-14 ENCOUNTER — Telehealth: Payer: BC Managed Care – PPO | Admitting: Family Medicine

## 2022-08-14 ENCOUNTER — Encounter: Payer: Self-pay | Admitting: Family Medicine

## 2022-08-14 DIAGNOSIS — J019 Acute sinusitis, unspecified: Secondary | ICD-10-CM

## 2022-08-14 DIAGNOSIS — Z6379 Other stressful life events affecting family and household: Secondary | ICD-10-CM

## 2022-08-14 DIAGNOSIS — B9689 Other specified bacterial agents as the cause of diseases classified elsewhere: Secondary | ICD-10-CM

## 2022-08-14 MED ORDER — DOXYCYCLINE HYCLATE 100 MG PO TABS: 100.0000 mg | ORAL_TABLET | Freq: Two times a day (BID) | ORAL | 0 refills | Status: AC

## 2022-08-14 NOTE — Progress Notes (Signed)
Telephone visit  Subjective: [id number] PCP: Janora Norlander, DO JJH:ERDEY B Brotzman is a 57 y.o. female calls for telephone consult today. Patient provides verbal consent for consult held via phone.  Due to COVID-19 pandemic this visit was conducted virtually. This visit type was conducted due to national recommendations for restrictions regarding the COVID-19 Pandemic (e.g. social distancing, sheltering in place) in an effort to limit this patient's exposure and mitigate transmission in our community. All issues noted in this document were discussed and addressed.  A physical exam was not performed with this format.   Location of patient: home Location of provider: [facility] Others present for call: none  1. Sinusitis Patient reports 2 week history of sinusitis, otalgia, dizziness and drainage.  She has been using saline nasal spray/ nasal irrigation, Mucinex.  She denies fevers.  Home COVID test negative.  Mother is sick with COVID in the nursing home.   She's been under more stress about her mom's health.   ROS: Per HPI  Allergies  Allergen Reactions   Other Anaphylaxis and Hives    Bee stings   Shellfish Allergy Swelling   Penicillins Hives   Sulfa Antibiotics Hives   Prednisone     Pt stated, "It made me feel slightly agitated. I could not sleep well"   Past Medical History:  Diagnosis Date   Arthritis    Asthma    Bowel obstruction ([place]) 08/2016   Diverticulitis    Miscarriage    Multiple thyroid nodules    Osteopenia    Parathyroid tumor    Pneumonia    SBO (small bowel obstruction) ([facility]CC)    Thyroid disease     Current Outpatient Medications:    acetaminophen (TYLENOL) 325 MG tablet, Take 650 mg by mouth every 6 (six) hours., Disp: , Rfl:    albuterol (VENTOLIN HFA) 108 (90 Base) MCG/ACT inhaler, Inhale 2 puffs into the lungs every 6 (six) hours as needed for wheezing or shortness of breath., Disp: 8 g, Rfl: 0   ALPRAZolam (XANAX) 0.25 MG tablet, TAKE 0.5 to 1  TABLET(0.25 MG) BY MOUTH qhs AS NEEDED FOR SLEEP OR ANXIETY, Disp: 30 tablet, Rfl: 1   cetirizine (ZYRTEC) 10 MG tablet, Take 10 mg by mouth daily., Disp: , Rfl:    doxycycline (VIBRA-TABS) 100 MG tablet, Take 1 tablet (100 mg total) by mouth 2 (two) times daily. 1 po bid, Disp: 20 tablet, Rfl: 0   fluticasone (FLOVENT HFA) 110 MCG/ACT inhaler, Inhale 2 puffs into the lungs 2 (two) times daily., Disp: 1 each, Rfl: 3   mirtazapine (REMERON) 15 MG tablet, TAKE 1/2 TABLET AT BEDTIME, Disp: 45 tablet, Rfl: 3   Rimegepant Sulfate (NURTEC) 75 MG TBDP, Take 1 tablet by mouth daily as needed (migraine)., Disp: 4 tablet, Rfl: 0   Semaglutide-Weight Management (WEGOVY) 0.5 MG/0.5ML SOAJ, Inject 0.5 mg into the skin every 7 (seven) days., Disp: 6 mL, Rfl: 3   SUMAtriptan (IMITREX) 50 MG tablet, Take 1 tablet (50 mg total) by mouth daily as needed for Migraine (may repeat in 2 hours)., Disp: 10 tablet, Rfl: 3   valACYclovir (VALTREX) 1000 MG tablet, Take 2 tablets (2,000 mg total) by mouth 2 (two) times daily. x1 day per flare. Repeat as needed for each fever blister flare., Disp: 20 tablet, Rfl: 0  Assessment/ Plan: 57 y.o. female   Acute bacterial sinusitis - Plan: doxycycline (VIBRA-TABS) 100 MG tablet  Stress due to illness of family member  Doxycycline sent in for suspected acute  bacterial sinusitis.  We discussed ongoing conservative care.  She will follow-up if symptoms or not resolving  Has an appointment to discuss ongoing stress issues in about a week or 2.  For now she will continue mirtazapine 7.5 mg but given her excessive sedation with higher doses and ongoing uncontrolled anxiety and depression we may need to consider switching medication off to something else  Start time: 9:34pm End time: 9:44pm  Total time spent on patient care (including telephone call/ virtual visit): 10 minutes  [place], [place] [phone]

## 2022-08-31 ENCOUNTER — Ambulatory Visit: Payer: BC Managed Care – PPO | Admitting: Family Medicine

## 2022-08-31 ENCOUNTER — Encounter: Payer: Self-pay | Admitting: Family Medicine

## 2022-08-31 VITALS — BP 131/87 | HR 76 | Temp 98.4°F | Ht 64.0 in | Wt 153.0 lb

## 2022-08-31 DIAGNOSIS — F4321 Adjustment disorder with depressed mood: Secondary | ICD-10-CM

## 2022-08-31 DIAGNOSIS — R350 Frequency of micturition: Secondary | ICD-10-CM

## 2022-08-31 DIAGNOSIS — Z79899 Other long term (current) drug therapy: Secondary | ICD-10-CM

## 2022-08-31 DIAGNOSIS — Z630 Problems in relationship with spouse or partner: Secondary | ICD-10-CM

## 2022-08-31 DIAGNOSIS — F419 Anxiety disorder, unspecified: Secondary | ICD-10-CM | POA: Diagnosis not present

## 2022-08-31 DIAGNOSIS — F430 Acute stress reaction: Secondary | ICD-10-CM

## 2022-08-31 LAB — URINALYSIS, ROUTINE W REFLEX MICROSCOPIC
Bilirubin, UA: NEGATIVE
Glucose, UA: NEGATIVE
Ketones, UA: NEGATIVE
Leukocytes,UA: NEGATIVE
Nitrite, UA: NEGATIVE
Protein,UA: NEGATIVE
RBC, UA: NEGATIVE
Specific Gravity, UA: 1.015 (ref 1.005–1.030)
Urobilinogen, Ur: 0.2 mg/dL (ref 0.2–1.0)
pH, UA: 6.5 (ref 5.0–7.5)

## 2022-08-31 MED ORDER — ALPRAZOLAM 0.25 MG PO TABS: ORAL_TABLET | ORAL | 1 refills | Status: DC

## 2022-08-31 MED ORDER — DESVENLAFAXINE SUCCINATE ER 50 MG PO TB24: 50.0000 mg | ORAL_TABLET | Freq: Every day | ORAL | 1 refills | Status: DC
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
## 2022-09-05 LAB — TOXASSURE SELECT 13 (MW), URINE

## 2022-10-12 ENCOUNTER — Encounter: Payer: Self-pay | Admitting: Family Medicine

## 2022-10-12 ENCOUNTER — Ambulatory Visit: Payer: BC Managed Care – PPO | Admitting: Family Medicine

## 2022-10-12 VITALS — BP 121/80 | HR 66 | Temp 98.7°F | Ht 64.0 in | Wt 152.0 lb

## 2022-10-12 DIAGNOSIS — F4321 Adjustment disorder with depressed mood: Secondary | ICD-10-CM | POA: Diagnosis not present

## 2022-10-12 DIAGNOSIS — E7800 Pure hypercholesterolemia, unspecified: Secondary | ICD-10-CM

## 2022-10-12 DIAGNOSIS — E663 Overweight: Secondary | ICD-10-CM

## 2022-10-12 DIAGNOSIS — J329 Chronic sinusitis, unspecified: Secondary | ICD-10-CM

## 2022-10-12 DIAGNOSIS — F419 Anxiety disorder, unspecified: Secondary | ICD-10-CM | POA: Diagnosis not present

## 2022-10-12 DIAGNOSIS — Z630 Problems in relationship with spouse or partner: Secondary | ICD-10-CM | POA: Diagnosis not present

## 2022-10-12 MED ORDER — DESVENLAFAXINE SUCCINATE ER 50 MG PO TB24: 50.0000 mg | ORAL_TABLET | Freq: Every day | ORAL | 3 refills | Status: DC

## 2022-10-12 MED ORDER — QSYMIA 3.75-23 MG PO CP24: ORAL_CAPSULE | ORAL | 0 refills | Status: DC

## 2022-10-12 MED ORDER — DOXYCYCLINE HYCLATE 100 MG PO TABS: 100.0000 mg | ORAL_TABLET | Freq: Two times a day (BID) | ORAL | 0 refills | Status: AC

## 2022-10-12 NOTE — Progress Notes (Unsigned)
Subjective: CC:*** PCP: Janora Norlander, DO [id number] B Carleo is a 57 y.o. female presenting to clinic today for:  1. ***   ROS: Per HPI  Allergies  Allergen Reactions   Other Anaphylaxis and Hives    Bee stings   Shellfish Allergy Swelling   Penicillins Hives   Sulfa Antibiotics Hives   Prednisone     Pt stated, "It made me feel slightly agitated. I could not sleep well"   Past Medical History:  Diagnosis Date   Arthritis    Asthma    Bowel obstruction ([place]) 08/2016   Diverticulitis    Miscarriage    Multiple thyroid nodules    Osteopenia    Parathyroid tumor    Pneumonia    SBO (small bowel obstruction) (HCC)    Thyroid disease     Current Outpatient Medications:    acetaminophen (TYLENOL) 325 MG tablet, Take 650 mg by mouth every 6 (six) hours., Disp: , Rfl:    albuterol (VENTOLIN HFA) 108 (90 Base) MCG/ACT inhaler, Inhale 2 puffs into the lungs every 6 (six) hours as needed for wheezing or shortness of breath., Disp: 8 g, Rfl: 0   ALPRAZolam (XANAX) 0.25 MG tablet, TAKE 0.5 to 1 TABLET(0.25 MG) BY MOUTH qhs AS NEEDED FOR SLEEP OR ANXIETY, Disp: 30 tablet, Rfl: 1   cetirizine (ZYRTEC) 10 MG tablet, Take 10 mg by mouth daily., Disp: , Rfl:    desvenlafaxine (PRISTIQ) 50 MG 24 hr tablet, Take 1 tablet (50 mg total) by mouth daily., Disp: 30 tablet, Rfl: 1   fluticasone (FLOVENT HFA) 110 MCG/ACT inhaler, Inhale 2 puffs into the lungs 2 (two) times daily., Disp: 1 each, Rfl: 3   Rimegepant Sulfate (NURTEC) 75 MG TBDP, Take 1 tablet by mouth daily as needed (migraine)., Disp: 4 tablet, Rfl: 0   Semaglutide-Weight Management (WEGOVY) 0.5 MG/0.5ML SOAJ, Inject 0.5 mg into the skin every 7 (seven) days., Disp: 6 mL, Rfl: 3   SUMAtriptan (IMITREX) 50 MG tablet, Take 1 tablet (50 mg total) by mouth daily as needed for Migraine (may repeat in 2 hours)., Disp: 10 tablet, Rfl: 3   valACYclovir (VALTREX) 1000 MG tablet, Take 2 tablets (2,000 mg total) by mouth 2 (two) times  daily. x1 day per flare. Repeat as needed for each fever blister flare., Disp: 20 tablet, Rfl: 0 Social History   Socioeconomic History   Marital status: Married    Spouse name: Not on file   Number of children: 3   Years of education: Not on file   Highest education level: Not on file  Occupational History   Occupation: Product manager: [employer]  Tobacco Use   Smoking status: Never   Smokeless tobacco: Never  Vaping Use   Vaping Use: Never used  Substance and Sexual Activity   Alcohol use: Yes    Alcohol/week: 1.0 standard drink of alcohol    Types: 1 Glasses of wine per week    Comment: occasional glass of wine    Drug use: No   Sexual activity: Not on file  Other Topics Concern   Not on file  Social History Narrative   The patient is married.  Her husband is retiring as Occupational hygienist of [employer] after serving in [place].   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, no tobacco no drug use   Social Determinants of Health  Financial Resource Strain: Not on file  Food Insecurity: Not on file  Transportation Needs: Not on file  Physical Activity: Not on file  Stress: Not on file  Social Connections: Not on file  Intimate Partner Violence: Not on file   Family History  Problem Relation Age of Onset   Arthritis Mother    Arthritis/Rheumatoid Mother    Asthma Mother    Heart disease Mother    Hypertension Mother    Stroke Mother    Vision loss Mother    Thyroid disease Mother    Colon polyps Mother    Thyroid cancer Mother    Arthritis Father    Heart disease Father    Hypertension Father    Colon polyps Father    Diverticulitis Father        with colon surgery   Arthritis/Rheumatoid Maternal Aunt    Arthritis/Rheumatoid Maternal Uncle    Diabetes Maternal Grandmother    Lung cancer Maternal Grandfather    Cancer Paternal Grandfather        type unknown   Colon cancer  Cousin    Thyroid cancer Cousin    Breast cancer Cousin     Objective: Office vital signs reviewed. Ht '5\' 4"'$  (1.626 m)   BMI 26.26 kg/m   Physical Examination:  General: Awake, alert, *** nourished, No acute distress HEENT: Normal    Neck: No masses palpated. No lymphadenopathy    Ears: Tympanic membranes intact, normal light reflex, no erythema, no bulging    Eyes: PERRLA, extraocular membranes intact, sclera ***    Nose: nasal turbinates moist, *** nasal discharge    Throat: moist mucus membranes, no erythema, *** tonsillar exudate.  Airway is patent Cardio: regular rate and rhythm, S1S2 heard, no murmurs appreciated Pulm: clear to auscultation bilaterally, no wheezes, rhonchi or rales; normal work of breathing on room air GI: soft, non-tender, non-distended, bowel sounds present x4, no hepatomegaly, no splenomegaly, no masses GU: external vaginal tissue ***, cervix ***, *** punctate lesions on cervix appreciated, *** discharge from cervical os, *** bleeding, *** cervical motion tenderness, *** abdominal/ adnexal masses Extremities: warm, well perfused, No edema, cyanosis or clubbing; +*** pulses bilaterally MSK: *** gait and *** station Skin: dry; intact; no rashes or lesions Neuro: *** Strength and light touch sensation grossly intact, *** DTRs ***/4  Assessment/ Plan: 57 y.o. female   ***  No orders of the defined types were placed in this encounter.  No orders of the defined types were placed in this encounter.    Janora Norlander, DO [place] [phone]

## 2022-10-17 ENCOUNTER — Telehealth: Payer: Self-pay

## 2022-10-17 NOTE — Telephone Encounter (Signed)
Williemae Area (Key: [facility]) PA Case ID #: [id number] Rx #: [id number] Need Help? Call us at [phone] Status sent iconSent to Plan today Drug Qsymia 3.75-'23MG'$  er capsules ePA cloud Child psychotherapist Electronic PA Form [phone] NCPDP) Original Claim Info [place] REQ-MD CALL [id number]

## 2022-10-23 NOTE — Telephone Encounter (Signed)
Please inform pt that her insurance will not cover the medication prescribed.

## 2022-10-23 NOTE — Telephone Encounter (Signed)
Denied on March 13 Your PA request has been denied. Additional information will be provided in the denial communication. (Message 1140)

## 2023-01-04 ENCOUNTER — Encounter: Payer: Self-pay | Admitting: Family Medicine

## 2023-01-04 ENCOUNTER — Ambulatory Visit (INDEPENDENT_AMBULATORY_CARE_PROVIDER_SITE_OTHER): Payer: BC Managed Care – PPO | Admitting: Family Medicine

## 2023-01-04 VITALS — BP 117/74 | HR 88 | Temp 98.4°F | Ht 64.0 in | Wt 158.0 lb

## 2023-01-04 DIAGNOSIS — T753XXA Motion sickness, initial encounter: Secondary | ICD-10-CM | POA: Diagnosis not present

## 2023-01-04 DIAGNOSIS — Z0000 Encounter for general adult medical examination without abnormal findings: Secondary | ICD-10-CM

## 2023-01-04 DIAGNOSIS — F419 Anxiety disorder, unspecified: Secondary | ICD-10-CM

## 2023-01-04 DIAGNOSIS — F4321 Adjustment disorder with depressed mood: Secondary | ICD-10-CM

## 2023-01-04 DIAGNOSIS — Z0001 Encounter for general adult medical examination with abnormal findings: Secondary | ICD-10-CM

## 2023-01-04 DIAGNOSIS — Z630 Problems in relationship with spouse or partner: Secondary | ICD-10-CM

## 2023-01-04 DIAGNOSIS — E663 Overweight: Secondary | ICD-10-CM | POA: Diagnosis not present

## 2023-01-04 DIAGNOSIS — B001 Herpesviral vesicular dermatitis: Secondary | ICD-10-CM

## 2023-01-04 DIAGNOSIS — J4520 Mild intermittent asthma, uncomplicated: Secondary | ICD-10-CM

## 2023-01-04 DIAGNOSIS — J0100 Acute maxillary sinusitis, unspecified: Secondary | ICD-10-CM

## 2023-01-04 DIAGNOSIS — E7800 Pure hypercholesterolemia, unspecified: Secondary | ICD-10-CM

## 2023-01-04 DIAGNOSIS — G43809 Other migraine, not intractable, without status migrainosus: Secondary | ICD-10-CM

## 2023-01-04 DIAGNOSIS — Z130 Encounter for screening for diseases of the blood and blood-forming organs and certain disorders involving the immune mechanism: Secondary | ICD-10-CM

## 2023-01-04 MED ORDER — CEFDINIR 300 MG PO CAPS
300.0000 mg | ORAL_CAPSULE | Freq: Two times a day (BID) | ORAL | 0 refills | Status: DC
Start: 2023-01-04 — End: 2023-04-01

## 2023-01-04 MED ORDER — ALPRAZOLAM 0.25 MG PO TABS
ORAL_TABLET | ORAL | 1 refills | Status: DC
Start: 2023-01-04 — End: 2023-04-01

## 2023-01-04 MED ORDER — FLUTICASONE PROPIONATE HFA 110 MCG/ACT IN AERO
INHALATION_SPRAY | RESPIRATORY_TRACT | 3 refills | Status: DC
Start: 2023-01-04 — End: 2023-01-07

## 2023-01-04 MED ORDER — VALACYCLOVIR HCL 1 G PO TABS: 2000.0000 mg | ORAL_TABLET | Freq: Two times a day (BID) | ORAL | 0 refills | Status: DC

## 2023-01-04 MED ORDER — ONDANSETRON 4 MG PO TBDP: 4.0000 mg | ORAL_TABLET | Freq: Three times a day (TID) | ORAL | 0 refills | Status: DC | PRN

## 2023-01-04 MED ORDER — SCOPOLAMINE 1 MG/3DAYS TD PT72
1.0000 | MEDICATED_PATCH | TRANSDERMAL | 12 refills | Status: DC
Start: 2023-01-04 — End: 2023-09-26

## 2023-01-04 MED ORDER — DESVENLAFAXINE SUCCINATE ER 50 MG PO TB24: 50.0000 mg | ORAL_TABLET | Freq: Every day | ORAL | 3 refills | Status: DC

## 2023-01-04 MED ORDER — SUMATRIPTAN SUCCINATE 50 MG PO TABS: ORAL_TABLET | ORAL | 3 refills | Status: DC

## 2023-01-04 NOTE — Progress Notes (Signed)
Makayla Jackson is a 57 y.o. female presents to office today for annual physical exam examination.    Concerns today include: 1.  Sinusitis Patient has been experiencing sinus issues for about 3 weeks now.  She reports bilateral ear fullness.  She has not run any fevers.  She reports no hemoptysis, shortness of breath or wheezing.  She has been utilizing her home allergy medications but symptoms really are not resolving.  2.  Motion sickness Patient worried about motion sickness.  She is going on her very first cruise to the [place].  Wanting to know if she can get a seasickness patch and medication for as needed use  3.  Anxiety Continues to use her alprazolam pretty frequently.  Several anniversaries of loved ones' deaths passed recently.  She admits that she self discontinued the Pristiq.  She since that time seems to have gained some weight and she wonders if this might be some emotional eating because she stays very very active.  She feels like she primarily uses the alprazolam for sleep.  She seems to be coping better with the issues that have been going on at home and her spouse is in the action phase of change.  She hopes that it lasts.  Occupation: Runner, broadcasting/film/video, Marital status: married, Substance use: none Diet: typical [place] , Exercise: very active/ structured walking program Last eye exam: UTD Last dental exam: UTD Last colonoscopy: UTD Last mammogram: UTD Last pap smear: UTD Refills needed today: all Immunizations needed: Immunization History  Administered Date(s) Administered   Hepatitis B 05/14/1995, 06/18/1995, 03/11/1996   Influenza, Seasonal, Injecte, Preservative Fre 08/20/2012, 07/20/2015, 08/17/2016   Influenza,inj,Quad PF,6+ Mos 05/07/2018, 05/14/2019, 08/11/2020, 04/24/2022   Influenza,inj,Quad PF,6-35 Mos 07/20/2015, 08/17/2016   Influenza,trivalent, recombinat, inj, PF 08/20/2012   Moderna Sars-Covid-2 Vaccination 12/10/2019, 01/12/2020   PPD Test 03/07/2018    Tdap 02/09/2020     Past Medical History:  Diagnosis Date   Arthritis    Asthma    Bowel obstruction (HCC) 08/2016   Diverticulitis    Miscarriage    Multiple thyroid nodules    Osteopenia    Parathyroid tumor    Pneumonia    SBO (small bowel obstruction) (HCC)    Thyroid disease    Social History   Socioeconomic History   Marital status: Married    Spouse name: Not on file   Number of children: 3   Years of education: Not on file   Highest education level: Not on file  Occupational History   Occupation: Magazine features editor: [employer]  Tobacco Use   Smoking status: Never   Smokeless tobacco: Never  Vaping Use   Vaping Use: Never used  Substance and Sexual Activity   Alcohol use: Yes    Alcohol/week: 1.0 standard drink of alcohol    Types: 1 Glasses of wine per week    Comment: occasional glass of wine    Drug use: No   Sexual activity: Not on file  Other Topics Concern   Not on file  Social History Narrative   The patient is married.  Her husband is retiring as Lawyer of [employer] after serving in [place].   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[facility]   Drinks 2 glasses of wine daily, no tobacco no drug use   Social Determinants of Corporate investment banker Strain: Not on file  Food Insecurity: Not on file  Transportation Needs:  Not on file  Physical Activity: Not on file  Stress: Not on file  Social Connections: Not on file  Intimate Partner Violence: Not on file   Past Surgical History:  Procedure Laterality Date   ABDOMINAL HYSTERECTOMY     COLON SURGERY  2014   Diverticulosis resection, after diverticulitis   FINE NEEDLE ASPIRATION     Thyroid Nodule   KNEE ARTHROSCOPY Right    NASAL SINUS SURGERY  1998   PARATHYROIDECTOMY  12/2014   PARTIAL HYSTERECTOMY  2010   TONSILLECTOMY     Family History  Problem Relation Age of Onset   Arthritis Mother    Arthritis/Rheumatoid Mother     Asthma Mother    Heart disease Mother    Hypertension Mother    Stroke Mother    Vision loss Mother    Thyroid disease Mother    Colon polyps Mother    Thyroid cancer Mother    Arthritis Father    Heart disease Father    Hypertension Father    Colon polyps Father    Diverticulitis Father        with colon surgery   Arthritis/Rheumatoid Maternal Aunt    Arthritis/Rheumatoid Maternal Uncle    Diabetes Maternal Grandmother    Lung cancer Maternal Grandfather    Cancer Paternal Grandfather        type unknown   Colon cancer Cousin    Thyroid cancer Cousin    Breast cancer Cousin     Current Outpatient Medications:    acetaminophen (TYLENOL) 325 MG tablet, Take 650 mg by mouth every 6 (six) hours., Disp: , Rfl:    albuterol (VENTOLIN HFA) 108 (90 Base) MCG/ACT inhaler, Inhale 2 puffs into the lungs every 6 (six) hours as needed for wheezing or shortness of breath., Disp: 8 g, Rfl: 0   ALPRAZolam (XANAX) 0.25 MG tablet, TAKE 0.5 to 1 TABLET(0.25 MG) BY MOUTH qhs AS NEEDED FOR SLEEP OR ANXIETY, Disp: 30 tablet, Rfl: 1   cetirizine (ZYRTEC) 10 MG tablet, Take 10 mg by mouth daily., Disp: , Rfl:    desvenlafaxine (PRISTIQ) 50 MG 24 hr tablet, Take 1 tablet (50 mg total) by mouth daily., Disp: 90 tablet, Rfl: 3   fluticasone (FLOVENT HFA) 110 MCG/ACT inhaler, Inhale 2 puffs into the lungs 2 (two) times daily., Disp: 1 each, Rfl: 3   Phentermine-Topiramate (QSYMIA) 3.75-23 MG CP24, Take 1 capsule by mouth daily for 14 days, THEN 2 capsules daily., Disp: 166 capsule, Rfl: 0   Rimegepant Sulfate (NURTEC) 75 MG TBDP, Take 1 tablet by mouth daily as needed (migraine)., Disp: 4 tablet, Rfl: 0   Semaglutide-Weight Management (WEGOVY) 0.5 MG/0.5ML SOAJ, Inject 0.5 mg into the skin every 7 (seven) days., Disp: 6 mL, Rfl: 3   SUMAtriptan (IMITREX) 50 MG tablet, Take 1 tablet (50 mg total) by mouth daily as needed for Migraine (may repeat in 2 hours)., Disp: 10 tablet, Rfl: 3   valACYclovir (VALTREX)  1000 MG tablet, Take 2 tablets (2,000 mg total) by mouth 2 (two) times daily. x1 day per flare. Repeat as needed for each fever blister flare., Disp: 20 tablet, Rfl: 0  Allergies  Allergen Reactions   Other Anaphylaxis and Hives    Bee stings   Shellfish Allergy Swelling   Penicillins Hives   Sulfa Antibiotics Hives   Prednisone     Pt stated, "It made me feel slightly agitated. I could not sleep well"     ROS: Review of Systems Pertinent items  noted in HPI and remainder of comprehensive ROS otherwise negative.    Physical exam BP 117/74   Pulse 88   Temp 98.4 F (36.9 C)   Ht 5\' 4"  (1.626 m)   Wt 158 lb (71.7 kg)   SpO2 94%   BMI 27.12 kg/m  General appearance: alert, cooperative, appears stated age, and no distress Head: Normocephalic, without obvious abnormality, atraumatic Eyes: negative findings: lids and lashes normal, conjunctivae and sclerae normal, corneas clear, and pupils equal, round, reactive to light and accomodation Ears: normal TM's and external ear canals both ears Nose: Nares normal. Septum midline. Mucosa normal. No drainage or sinus tenderness. Throat: lips, mucosa, and tongue normal; teeth and gums normal Neck: no adenopathy, no carotid bruit, supple, symmetrical, trachea midline, and thyroid not enlarged, symmetric, no tenderness/mass/nodules Back: symmetric, no curvature. ROM normal. No CVA tenderness. Lungs: clear to auscultation bilaterally Heart: regular rate and rhythm, S1, S2 normal, no murmur, click, rub or gallop Abdomen: soft, non-tender; bowel sounds normal; no masses,  no organomegaly Extremities: extremities normal, atraumatic, no cyanosis or edema Pulses: 2+ and symmetric Skin:  Several pigmented nevi. Lymph nodes: Cervical, supraclavicular, and axillary nodes normal. Neurologic: Grossly normal Psych: Mood stable, speech normal, affect appropriate.  Very pleasant, interactive     08/31/2022   11:39 AM 04/24/2022    3:41 PM 12/19/2021    11:42 AM  Depression screen PHQ 2/9  Decreased Interest 1 1 1   Down, Depressed, Hopeless 1 3 1   PHQ - 2 Score 2 4 2   Altered sleeping 0 2 2  Tired, decreased energy 1 2 2   Change in appetite 0 1 0  Feeling bad or failure about yourself  1 2 0  Trouble concentrating 0 2 1  Moving slowly or fidgety/restless 0 0 0  Suicidal thoughts 0 0 0  PHQ-9 Score 4 13 7   Difficult doing work/chores Somewhat difficult Very difficult Somewhat difficult      10/12/2022    3:53 PM 08/31/2022   11:39 AM 04/24/2022    3:41 PM 12/19/2021   11:43 AM  GAD 7 : Generalized Anxiety Score  Nervous, Anxious, on Edge 1 2 2 2   Control/stop worrying 1 1 2 1   Worry too much - different things 1 1 2 1   Trouble relaxing 0 1 2 1   Restless 0 1 2 0  Easily annoyed or irritable 0 1 3 1   Afraid - awful might happen 0 0 1 2  Total GAD 7 Score 3 7 14 8   Anxiety Difficulty Not difficult at all Somewhat difficult Somewhat difficult Somewhat difficult    Assessment/ Plan: Marni Griffon here for annual physical exam.   Annual physical exam  Acute non-recurrent maxillary sinusitis - Plan: cefdinir (OMNICEF) 300 MG capsule  Motion sickness, initial encounter - Plan: ondansetron (ZOFRAN-ODT) 4 MG disintegrating tablet, scopolamine (TRANSDERM-SCOP) 1 MG/3DAYS  Overweight (BMI 25.0-29.9) - Plan: CBC, VITAMIN D 25 Hydroxy (Vit-D Deficiency, Fractures)  Pure hypercholesterolemia - Plan: CMP14+EGFR, TSH, Lipid panel  Anxiety - Plan: ALPRAZolam (XANAX) 0.25 MG tablet, desvenlafaxine (PRISTIQ) 50 MG 24 hr tablet  Marital stress - Plan: ALPRAZolam (XANAX) 0.25 MG tablet, desvenlafaxine (PRISTIQ) 50 MG 24 hr tablet  Grief reaction - Plan: ALPRAZolam (XANAX) 0.25 MG tablet, desvenlafaxine (PRISTIQ) 50 MG 24 hr tablet  Screening, anemia, deficiency, iron - Plan: CBC  Mild intermittent reactive airway disease without complication - Plan: fluticasone (FLOVENT HFA) 110 MCG/ACT inhaler  Other migraine without status migrainosus,  not intractable - Plan:  SUMAtriptan (IMITREX) 50 MG tablet  Fever blister - Plan: valACYclovir (VALTREX) 1000 MG tablet  Omnicef prescribed.  Continue allergy medications  Transderm Scop ordered as well as Zofran as needed.  Discussed potential side effects and risks of Transderm Scop.  She understands indications to discontinue.  Fasting labs have been ordered and she will come in at a later date to have these completed  UDS and CSC are up-to-date.  National narcotic database reviewed and there were no red flags.  Advised her to resume use of Pristiq and uses Xanax sparingly  The remainder of medications were renewed but we did not discuss fever blisters, migraines or reactive airway disease at any length today  Counseled on healthy lifestyle choices, including diet (rich in fruits, vegetables and lean meats and low in salt and simple carbohydrates) and exercise (at least 30 minutes of moderate physical activity daily).  Patient to follow up in 11m for mood  Mariaclara Spear M. Nadine Counts, DO

## 2023-01-05 ENCOUNTER — Encounter: Payer: Self-pay | Admitting: Family Medicine

## 2023-01-07 ENCOUNTER — Telehealth: Payer: Self-pay | Admitting: *Deleted

## 2023-01-07 DIAGNOSIS — J4520 Mild intermittent asthma, uncomplicated: Secondary | ICD-10-CM

## 2023-01-07 MED ORDER — FLUTICASONE-SALMETEROL 100-50 MCG/ACT IN AEPB
1.0000 | INHALATION_SPRAY | Freq: Two times a day (BID) | RESPIRATORY_TRACT | 3 refills | Status: DC
Start: 2023-01-07 — End: 2024-01-06

## 2023-01-07 NOTE — Addendum Note (Signed)
Addended by: Raliegh Ip on: 01/07/2023 05:08 PM   Modules accepted: Orders

## 2023-01-07 NOTE — Telephone Encounter (Signed)
Fax from [place] [facility] RE: [facility] note: please consider change to formulary medication per pt's insurance - Pulmicort Flexhaler Please advise

## 2023-01-07 NOTE — Telephone Encounter (Signed)
Please inform of change to Advair due to ins coverage

## 2023-01-08 NOTE — Telephone Encounter (Signed)
Pt aware of medication change 

## 2023-01-10 ENCOUNTER — Other Ambulatory Visit: Payer: BC Managed Care – PPO

## 2023-01-10 ENCOUNTER — Other Ambulatory Visit: Payer: Self-pay | Admitting: Family Medicine

## 2023-01-10 DIAGNOSIS — Z130 Encounter for screening for diseases of the blood and blood-forming organs and certain disorders involving the immune mechanism: Secondary | ICD-10-CM

## 2023-01-10 DIAGNOSIS — E663 Overweight: Secondary | ICD-10-CM

## 2023-01-10 DIAGNOSIS — E7800 Pure hypercholesterolemia, unspecified: Secondary | ICD-10-CM

## 2023-01-10 DIAGNOSIS — Z1231 Encounter for screening mammogram for malignant neoplasm of breast: Secondary | ICD-10-CM

## 2023-01-10 LAB — CMP14+EGFR
AST: 26 IU/L (ref 0–40)
BUN: 13 mg/dL (ref 6–24)

## 2023-01-10 LAB — TSH

## 2023-01-11 LAB — LIPID PANEL
Chol/HDL Ratio: 2 ratio (ref 0.0–4.4)
Cholesterol, Total: 207 mg/dL — ABNORMAL HIGH (ref 100–199)
HDL: 104 mg/dL (ref >39)
LDL Chol Calc (NIH): 94 mg/dL (ref 0–99)
Triglycerides: 51 mg/dL (ref 0–149)
VLDL Cholesterol Cal: 9 mg/dL (ref 5–40)

## 2023-01-11 LAB — CBC
Hematocrit: 42.2 % (ref 34.0–46.6)
Hemoglobin: 14.1 g/dL (ref 11.1–15.9)
MCH: 30.1 pg (ref 26.6–33.0)
MCHC: 33.4 g/dL (ref 31.5–35.7)
MCV: 90 fL (ref 79–97)
Platelets: 261 10*3/uL (ref 150–450)
RBC: 4.69 x10E6/uL (ref 3.77–5.28)
RDW: 12.1 % (ref 11.7–15.4)
WBC: 3.9 10*3/uL (ref 3.4–10.8)

## 2023-01-11 LAB — CMP14+EGFR
ALT: 18 IU/L (ref 0–32)
Albumin/Globulin Ratio: 1.8 (ref 1.2–2.2)
Albumin: 4.4 g/dL (ref 3.8–4.9)
Alkaline Phosphatase: 68 IU/L (ref 44–121)
BUN/Creatinine Ratio: 18 (ref 9–23)
Bilirubin Total: 0.4 mg/dL (ref 0.0–1.2)
CO2: 21 mmol/L (ref 20–29)
Calcium: 9.5 mg/dL (ref 8.7–10.2)
Chloride: 103 mmol/L (ref 96–106)
Globulin, Total: 2.4 g/dL (ref 1.5–4.5)
Glucose: 84 mg/dL (ref 70–99)
Potassium: 4.4 mmol/L (ref 3.5–5.2)
Total Protein: 6.8 g/dL (ref 6.0–8.5)
eGFR: 97 mL/min/{1.73_m2} (ref >59)

## 2023-01-11 LAB — VITAMIN D 25 HYDROXY (VIT D DEFICIENCY, FRACTURES): Vit D, 25-Hydroxy: 45.3 ng/mL (ref 30.0–100.0)

## 2023-01-11 NOTE — Progress Notes (Signed)
Pt r/c.

## 2023-01-14 ENCOUNTER — Ambulatory Visit
Admission: RE | Admit: 2023-01-14 | Discharge: 2023-01-14 | Disposition: A | Discharge status: Home / Self Care | Payer: BC Managed Care – PPO | Source: Ambulatory Visit | Attending: Family Medicine | Admitting: Family Medicine

## 2023-01-14 ENCOUNTER — Ambulatory Visit
Admission: RE | Admit: 2023-01-14 | Discharge: 2023-01-14 | Disposition: A | Discharge status: Home / Self Care | Payer: BC Managed Care – PPO | Source: Ambulatory Visit

## 2023-01-14 DIAGNOSIS — Z1231 Encounter for screening mammogram for malignant neoplasm of breast: Secondary | ICD-10-CM

## 2023-01-15 ENCOUNTER — Telehealth: Payer: Self-pay | Admitting: Family Medicine

## 2023-01-15 NOTE — Telephone Encounter (Signed)
Pt has been notified.

## 2023-04-01 ENCOUNTER — Encounter: Payer: Self-pay | Admitting: Family Medicine

## 2023-04-01 ENCOUNTER — Ambulatory Visit: Payer: BC Managed Care – PPO | Admitting: Family Medicine

## 2023-04-01 VITALS — BP 128/88 | HR 72 | Temp 98.3°F | Ht 64.0 in | Wt 157.0 lb

## 2023-04-01 DIAGNOSIS — Z6379 Other stressful life events affecting family and household: Secondary | ICD-10-CM

## 2023-04-01 DIAGNOSIS — J4520 Mild intermittent asthma, uncomplicated: Secondary | ICD-10-CM

## 2023-04-01 DIAGNOSIS — Z630 Problems in relationship with spouse or partner: Secondary | ICD-10-CM | POA: Diagnosis not present

## 2023-04-01 DIAGNOSIS — F4321 Adjustment disorder with depressed mood: Secondary | ICD-10-CM

## 2023-04-01 DIAGNOSIS — F419 Anxiety disorder, unspecified: Secondary | ICD-10-CM | POA: Diagnosis not present

## 2023-04-01 NOTE — Progress Notes (Addendum)
Subjective: CC: Chronic follow-up PCP: Raliegh Ip, DO Makayla Jackson is a 57 y.o. female presenting to clinic today for:  1.  Generalized anxiety disorder, stress at home Compliant with Pristiq 50 mg.  She feels like she is managing okay.  She reports her husband started drinking again and this is really caused some strain on her relationship.  She just ordered back at school today.  She notes that her father may have colon cancer and this has been weighing on her spirit.  Utilizing the Xanax as needed but never did get the second refill because she has not needed it yet  2.  Asthma She reports some breakthrough asthma symptoms but admits she is not consistent with her Advair.  Ragweed seems to aggravate her lungs.   ROS: Per HPI  Allergies  Allergen Reactions   Other Anaphylaxis and Hives    Bee stings   Shellfish Allergy Swelling   Penicillins Hives   Sulfa Antibiotics Hives   Prednisone     Pt stated, "It made me feel slightly agitated. I could not sleep well"   Past Medical History:  Diagnosis Date   Arthritis    Asthma    Bowel obstruction (HCC) 08/2016   Diverticulitis    Miscarriage    Multiple thyroid nodules    Osteopenia    Parathyroid tumor    Pneumonia    SBO (small bowel obstruction) (HCC)    Thyroid disease     Current Outpatient Medications:    acetaminophen (TYLENOL) 325 MG tablet, Take 650 mg by mouth every 6 (six) hours., Disp: , Rfl:    albuterol (VENTOLIN HFA) 108 (90 Base) MCG/ACT inhaler, Inhale 2 puffs into the lungs every 6 (six) hours as needed for wheezing or shortness of breath., Disp: 8 g, Rfl: 0   ALPRAZolam (XANAX) 0.25 MG tablet, TAKE 0.5 to 1 TABLET(0.25 MG) BY MOUTH qhs AS NEEDED FOR SLEEP OR ANXIETY, Disp: 30 tablet, Rfl: 1   cetirizine (ZYRTEC) 10 MG tablet, Take 10 mg by mouth daily., Disp: , Rfl:    desvenlafaxine (PRISTIQ) 50 MG 24 hr tablet, Take 1 tablet (50 mg total) by mouth daily., Disp: 90 tablet, Rfl: 3    fluticasone-salmeterol (ADVAIR) 100-50 MCG/ACT AEPB, Inhale 1 puff into the lungs 2 (two) times daily., Disp: 180 each, Rfl: 3   ondansetron (ZOFRAN-ODT) 4 MG disintegrating tablet, Take 1 tablet (4 mg total) by mouth every 8 (eight) hours as needed for nausea or vomiting., Disp: 20 tablet, Rfl: 0   scopolamine (TRANSDERM-SCOP) 1 MG/3DAYS, Place 1 patch (1.5 mg total) onto the skin every 3 (three) days., Disp: 10 patch, Rfl: 12   SUMAtriptan (IMITREX) 50 MG tablet, Take 1 tablet (50 mg total) by mouth daily as needed for Migraine (may repeat in 2 hours)., Disp: 10 tablet, Rfl: 3   valACYclovir (VALTREX) 1000 MG tablet, Take 2 tablets (2,000 mg total) by mouth 2 (two) times daily. x1 day per flare. Repeat as needed for each fever blister flare., Disp: 20 tablet, Rfl: 0 Social History   Socioeconomic History   Marital status: Married    Spouse name: Not on file   Number of children: 3   Years of education: Not on file   Highest education level: Not on file  Occupational History   Occupation: Runner, broadcasting/film/video    Employer: [employer]  Tobacco Use   Smoking status: Never   Smokeless tobacco: Never  Vaping Use   Vaping status: Never Used  Substance and Sexual Activity   Alcohol use: Yes    Alcohol/week: 1.0 standard drink of alcohol    Types: 1 Glasses of wine per week    Comment: occasional glass of wine    Drug use: No   Sexual activity: Not on file  Other Topics Concern   Not on file  Social History Narrative   The patient is married.  Her husband is retiring as Lawyer of [employer] after serving in [place].   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[employer]   Drinks 2 glasses of wine daily, no tobacco no drug use   Social Determinants of Community education officer: Not on file  Food Insecurity: Not on file  Transportation Needs: Not on file  Physical Activity: Not on file  Stress: Not on file  Social Connections: Not on file   Intimate Partner Violence: Not on file   Family History  Problem Relation Age of Onset   Arthritis Mother    Arthritis/Rheumatoid Mother    Asthma Mother    Heart disease Mother    Hypertension Mother    Stroke Mother    Vision loss Mother    Thyroid disease Mother    Colon polyps Mother    Thyroid cancer Mother    Arthritis Father    Heart disease Father    Hypertension Father    Colon polyps Father    Diverticulitis Father        with colon surgery   Arthritis/Rheumatoid Maternal Aunt    Arthritis/Rheumatoid Maternal Uncle    Diabetes Maternal Grandmother    Lung cancer Maternal Grandfather    Cancer Paternal Grandfather        type unknown   Colon cancer Cousin    Thyroid cancer Cousin    Breast cancer Cousin     Objective: Office vital signs reviewed. BP 128/88   Pulse 72   Temp 98.3 F (36.8 C)   Ht 5\' 4"  (1.626 m)   Wt 157 lb (71.2 kg)   SpO2 93%   BMI 26.95 kg/m   Physical Examination:  General: Awake, alert, well nourished, No acute distress HEENT: sclera white, MMM Cardio: regular rate and rhythm, S1S2 heard, no murmurs appreciated Pulm: clear to auscultation bilaterally, no wheezes, rhonchi or rales; normal work of breathing on room air Psych: Mood stable, speech normal.  Very pleasant, interactive     04/01/2023    4:04 PM 08/31/2022   11:39 AM 04/24/2022    3:41 PM  Depression screen PHQ 2/9  Decreased Interest 0 1 1  Down, Depressed, Hopeless 1 1 3   PHQ - 2 Score 1 2 4   Altered sleeping 2 0 2  Tired, decreased energy 1 1 2   Change in appetite 1 0 1  Feeling bad or failure about yourself  0 1 2  Trouble concentrating 0 0 2  Moving slowly or fidgety/restless 0 0 0  Suicidal thoughts 0 0 0  PHQ-9 Score 5 4 13   Difficult doing work/chores Somewhat difficult Somewhat difficult Very difficult      04/01/2023    4:04 PM 10/12/2022    3:53 PM 08/31/2022   11:39 AM 04/24/2022    3:41 PM  GAD 7 : Generalized Anxiety Score  Nervous, Anxious, on  Edge 2 1 2 2   Control/stop worrying 1 1 1 2   Worry too much - different things 1 1 1 2   Trouble relaxing 1 0  1 2  Restless 2 0 1 2  Easily annoyed or irritable 2 0 1 3  Afraid - awful might happen 0 0 0 1  Total GAD 7 Score 9 3 7 14   Anxiety Difficulty Somewhat difficult Not difficult at all Somewhat difficult Somewhat difficult       Assessment/ Plan: 57 y.o. female   Mild intermittent reactive airway disease without complication  Marital stress - Plan: Ambulatory referral to [facility], ALPRAZolam (XANAX) 0.25 MG tablet  Anxiety - Plan: Ambulatory referral to [facility], ALPRAZolam (XANAX) 0.25 MG tablet  Stress due to illness of family member - Plan: Ambulatory referral to [facility]  Encouraged consistent use of Advair.  I am glad to increase dose if needed.  Referral to integrated behavioral health for counseling services.  Has another refill of benzodiazepine if needed.  Offered to increase Pristiq if needed but at this time she feels stable on 50 mg.  Patient and/or legal guardian verbally consented to [facility] services about presenting concerns and psychiatric consultation as appropriate.  The services will be billed as appropriate for the patient   Raliegh Ip, DO [facility] [phone]

## 2023-04-01 NOTE — Patient Instructions (Signed)
You still have 1 xanax refill through November if needed.  You met with Arlys John today.

## 2023-04-02 ENCOUNTER — Encounter: Payer: Self-pay | Admitting: Family Medicine

## 2023-04-02 MED ORDER — ALPRAZOLAM 0.25 MG PO TABS
ORAL_TABLET | ORAL | 1 refills | Status: DC
Start: 2023-04-02 — End: 2023-08-05

## 2023-04-29 ENCOUNTER — Ambulatory Visit: Payer: BC Managed Care – PPO | Admitting: Professional Counselor

## 2023-04-29 DIAGNOSIS — Z630 Problems in relationship with spouse or partner: Secondary | ICD-10-CM | POA: Diagnosis not present
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
## 2023-05-10 ENCOUNTER — Encounter: Payer: Self-pay | Admitting: Family Medicine

## 2023-05-10 ENCOUNTER — Telehealth (INDEPENDENT_AMBULATORY_CARE_PROVIDER_SITE_OTHER): Payer: BC Managed Care – PPO | Admitting: Professional Counselor

## 2023-05-10 ENCOUNTER — Ambulatory Visit (INDEPENDENT_AMBULATORY_CARE_PROVIDER_SITE_OTHER): Payer: BC Managed Care – PPO | Admitting: Family Medicine

## 2023-05-10 VITALS — BP 109/63 | HR 88 | Temp 98.1°F | Ht 64.0 in | Wt 157.0 lb

## 2023-05-10 DIAGNOSIS — F419 Anxiety disorder, unspecified: Secondary | ICD-10-CM | POA: Diagnosis not present

## 2023-05-10 DIAGNOSIS — R42 Dizziness and giddiness: Secondary | ICD-10-CM | POA: Diagnosis not present

## 2023-05-10 DIAGNOSIS — J0100 Acute maxillary sinusitis, unspecified: Secondary | ICD-10-CM

## 2023-05-10 MED ORDER — DOXYCYCLINE HYCLATE 100 MG PO TABS
100.0000 mg | ORAL_TABLET | Freq: Two times a day (BID) | ORAL | 0 refills | Status: AC
Start: 2023-05-10 — End: 2023-05-17

## 2023-05-10 NOTE — BH Specialist Note (Signed)
Virtual Behavioral Health Treatment Plan Team Note  MRN: [id number] NAME: Makayla Jackson  DATE: 05/10/23  Start time: Start Time: 1039 End time: Stop Time: 1100 Total time: Total Time in Minutes (Visit): 21  Total number of Virtual BH Treatment Team Plan encounters: 1/4  Treatment Team Attendees: Dr. Vanetta Shawl and Esmond Harps  Collaborative Care Psychiatric Consultant Case Review    Assessment/Provisional Diagnosis Makayla Jackson is a 57 y.o. year old female with history of anxiety, asthma. The patient is referred for anxiety.    # GAD The patient is experiencing depressive symptoms and anxiety in the context of the following stressors. We will explore whether she is interested in traditional therapy. If not, we will work together to establish treatment goals with a short-term intervention plan. Will not adjust her medication at this time, as she seems satisfied with her current regimen.   Recommendation Continue Pristiq 50 mg daily BH specialist will follow up again to explore additional needs from collaborative care and/or to determine if the patient is interested in seeing a traditional therapist for CBT.    Goals, Interventions and Follow-up Plan Goals: Let go of resentment and control" Interventions: Behavioral Activation CBT Cognitive Behavioral Therapy Medication Management Recommendations: Continue Pristiq 50 mg daily Follow-up Plan:  Explore referral to traditional therapy  History of the present illness Presenting Problem/Current Symptoms: The patient presents with significant emotional and psychological distress related to her husband's alcoholism, which developed following gastric bypass surgery approximately ten years ago. This has resulted in heightened anxiety, stress, anger, and resentment towards him. Currently, she reports dissatisfaction with her life, characterized by sleep disturbances, chronic interpersonal conflict, persistent worry, irritability, and loss of  pleasure in previously enjoyed activities.  The patient also struggles with unresolved grief from her brother's murder two years ago and her mother's death eight months ago, which have not been adequately processed due to her husband's ongoing issues. She has a history of marital difficulties, having divorced her first husband due to infidelity, and has a daughter from that relationship. Now, she contemplates separation from her current husband but feels compelled to support him.   She has no documented psychiatric history, including prior diagnoses, hospitalizations, or episodes of suicidality; she currently reports no suicidal thoughts. The patient is managing her depression with Pristiq.   The patient seeks to develop coping skills to manage her anger and irritability, reduce her need for control, and prioritize self-care. Her goals include processing unresolved grief and improving overall emotional well-being.    Screenings PHQ-9 Assessments:     05/10/2023    9:32 AM 04/29/2023    3:15 PM 04/01/2023    4:04 PM  Depression screen PHQ 2/9  Decreased Interest 0 1 0  Down, Depressed, Hopeless 1 1 1   PHQ - 2 Score 1 2 1   Altered sleeping 1 2 2   Tired, decreased energy 1 2 1   Change in appetite 0 1 1  Feeling bad or failure about yourself  0 0 0  Trouble concentrating 1 1 0  Moving slowly or fidgety/restless 0 0 0  Suicidal thoughts 0 0 0  PHQ-9 Score 4 8 5   Difficult doing work/chores Somewhat difficult Somewhat difficult Somewhat difficult   GAD-7 Assessments:     05/10/2023    9:32 AM 04/29/2023    3:41 PM 04/01/2023    4:04 PM 10/12/2022    3:53 PM  GAD 7 : Generalized Anxiety Score  Nervous, Anxious, on Edge 1 1 2 1   Control/stop  worrying 1 1 1 1   Worry too much - different things 0 2 1 1   Trouble relaxing 1 1 1  0  Restless 0 2 2 0  Easily annoyed or irritable 1 2 2  0  Afraid - awful might happen 0 0 0 0  Total GAD 7 Score 4 9 9 3   Anxiety Difficulty Somewhat difficult  Somewhat difficult Somewhat difficult Not difficult at all    Past Medical History Past Medical History:  Diagnosis Date   Arthritis    Asthma    Bowel obstruction (HCC) 08/2016   Diverticulitis    Miscarriage    Multiple thyroid nodules    Osteopenia    Parathyroid tumor    Pneumonia    SBO (small bowel obstruction) (HCC)    Thyroid disease     Vital signs: There were no vitals filed for this visit.  Allergies:  Allergies as of 05/10/2023 - Review Complete 05/10/2023  Allergen Reaction Noted   Other Anaphylaxis and Hives 02/20/2016   Shellfish allergy Swelling 12/06/2014   Penicillins Hives 04/17/2012   Sulfa antibiotics Hives 04/17/2012   Prednisone  09/01/2018    Medication History Current medications:  Outpatient Encounter Medications as of 05/10/2023  Medication Sig   acetaminophen (TYLENOL) 325 MG tablet Take 650 mg by mouth every 6 (six) hours.   albuterol (VENTOLIN HFA) 108 (90 Base) MCG/ACT inhaler Inhale 2 puffs into the lungs every 6 (six) hours as needed for wheezing or shortness of breath.   ALPRAZolam (XANAX) 0.25 MG tablet TAKE 0.5 to 1 TABLET(0.25 MG) BY MOUTH qhs AS NEEDED FOR SLEEP OR ANXIETY   cetirizine (ZYRTEC) 10 MG tablet Take 10 mg by mouth daily.   desvenlafaxine (PRISTIQ) 50 MG 24 hr tablet Take 1 tablet (50 mg total) by mouth daily.   doxycycline (VIBRA-TABS) 100 MG tablet Take 1 tablet (100 mg total) by mouth 2 (two) times daily for 7 days.   fluticasone-salmeterol (ADVAIR) 100-50 MCG/ACT AEPB Inhale 1 puff into the lungs 2 (two) times daily.   ondansetron (ZOFRAN-ODT) 4 MG disintegrating tablet Take 1 tablet (4 mg total) by mouth every 8 (eight) hours as needed for nausea or vomiting.   scopolamine (TRANSDERM-SCOP) 1 MG/3DAYS Place 1 patch (1.5 mg total) onto the skin every 3 (three) days.   SUMAtriptan (IMITREX) 50 MG tablet Take 1 tablet (50 mg total) by mouth daily as needed for Migraine (may repeat in 2 hours).   valACYclovir (VALTREX) 1000  MG tablet Take 2 tablets (2,000 mg total) by mouth 2 (two) times daily. x1 day per flare. Repeat as needed for each fever blister flare.   No facility-administered encounter medications on file as of 05/10/2023.     Scribe for Treatment Team: Reuel Boom

## 2023-05-10 NOTE — Progress Notes (Signed)
Subjective:  Patient ID: Makayla Jackson, female    DOB: 1965-09-18, 57 y.o.   MRN: [id number]  Patient Care Team: Raliegh Ip, DO as PCP - General (Family Medicine)   Chief Complaint:  Sinus Problem ([place]/Sore cheekbones/Ears feel tight/Teeth hurt)   HPI: HASANA Jackson is a 57 y.o. female presenting on 05/10/2023 for Sinus Problem (Congestio/Sore cheekbones/Ears feel tight/Teeth hurt)  Reports that she has seasonal allergies and typically gets infections when seasons change. Reports that she has been taking zyrtec and flonase but her allergy symptoms increased. One week ago had intense congestion. Reports that her rhinorrhea changed from clear to dark yellow. In addition, she is experiencing sinus pressure, dizziness, and mild fever. Tmax 99. She has been trying warm compresses on her face for sinus pain. She took Advil this am for fever. Reports that she also tried a decongestant, but that did not improve her symptoms. Denies sore throat, nausea, vomiting. 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   Relevant past medical, surgical, family, and social history reviewed and updated as indicated.  Allergies and medications reviewed and updated. Data reviewed: Chart in Epic.   Past Medical History:  Diagnosis Date   Arthritis    Asthma    Bowel obstruction (HCC) 08/2016   Diverticulitis    Miscarriage    Multiple thyroid nodules    Osteopenia    Parathyroid tumor    Pneumonia    SBO (small bowel obstruction) (HCC)    Thyroid disease     Past Surgical History:  Procedure Laterality Date   ABDOMINAL HYSTERECTOMY     COLON SURGERY  2014   Diverticulosis resection, after diverticulitis   FINE NEEDLE ASPIRATION     Thyroid Nodule   KNEE ARTHROSCOPY Right    NASAL SINUS SURGERY  1998   PARATHYROIDECTOMY  12/2014   PARTIAL HYSTERECTOMY  2010   TONSILLECTOMY      Social History   Socioeconomic History   Marital status: Married    Spouse name:  Not on file   Number of children: 3   Years of education: Not on file   Highest education level: Not on file  Occupational History   Occupation: Magazine features editor: [employer]  Tobacco Use   Smoking status: Never   Smokeless tobacco: Never  Vaping Use   Vaping status: Never Used  Substance and Sexual Activity   Alcohol use: Yes    Alcohol/week: 1.0 standard drink of alcohol    Types: 1 Glasses of wine per week    Comment: occasional glass of wine    Drug use: No   Sexual activity: Not on file  Other Topics Concern   Not on file  Social History Narrative   The patient is married.  Her husband is retiring as Lawyer of [employer] after serving in [place].   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[facility]   Drinks 2 glasses of wine daily, no tobacco no drug use   Social Determinants of Corporate investment banker Strain: Not on file  Food Insecurity: Not on file  Transportation Needs: Not on file  Physical Activity: Not on file  Stress: Not on file  Social Connections: Not on file  Intimate Partner Violence: Not on file    Outpatient Encounter Medications as of 05/10/2023  Medication Sig   acetaminophen (TYLENOL) 325 MG tablet Take 650 mg  by mouth every 6 (six) hours.   albuterol (VENTOLIN HFA) 108 (90 Base) MCG/ACT inhaler Inhale 2 puffs into the lungs every 6 (six) hours as needed for wheezing or shortness of breath.   ALPRAZolam (XANAX) 0.25 MG tablet TAKE 0.5 to 1 TABLET(0.25 MG) BY MOUTH qhs AS NEEDED FOR SLEEP OR ANXIETY   cetirizine (ZYRTEC) 10 MG tablet Take 10 mg by mouth daily.   desvenlafaxine (PRISTIQ) 50 MG 24 hr tablet Take 1 tablet (50 mg total) by mouth daily.   fluticasone-salmeterol (ADVAIR) 100-50 MCG/ACT AEPB Inhale 1 puff into the lungs 2 (two) times daily.   ondansetron (ZOFRAN-ODT) 4 MG disintegrating tablet Take 1 tablet (4 mg total) by mouth every 8 (eight) hours as needed for nausea or vomiting.    scopolamine (TRANSDERM-SCOP) 1 MG/3DAYS Place 1 patch (1.5 mg total) onto the skin every 3 (three) days.   SUMAtriptan (IMITREX) 50 MG tablet Take 1 tablet (50 mg total) by mouth daily as needed for Migraine (may repeat in 2 hours).   valACYclovir (VALTREX) 1000 MG tablet Take 2 tablets (2,000 mg total) by mouth 2 (two) times daily. x1 day per flare. Repeat as needed for each fever blister flare.   No facility-administered encounter medications on file as of 05/10/2023.    Allergies  Allergen Reactions   Other Anaphylaxis and Hives    Bee stings   Shellfish Allergy Swelling   Penicillins Hives   Sulfa Antibiotics Hives   Prednisone     Pt stated, "It made me feel slightly agitated. I could not sleep well"    Review of Systems As per HPI Objective:  BP 109/63   Pulse 88   Temp 98.1 F (36.7 C)   Ht 5\' 4"  (1.626 m)   Wt 157 lb (71.2 kg)   SpO2 95%   BMI 26.95 kg/m    Wt Readings from Last 3 Encounters:  05/10/23 157 lb (71.2 kg)  04/01/23 157 lb (71.2 kg)  01/04/23 158 lb (71.7 kg)    Physical Exam Constitutional:      General: She is awake. She is not in acute distress.    Appearance: Normal appearance. She is well-developed and well-groomed. She is not ill-appearing, toxic-appearing or diaphoretic.  HENT:     Right Ear: No laceration, drainage, swelling or tenderness. A middle ear effusion is present. There is no impacted cerumen. No foreign body. No mastoid tenderness. No PE tube. No hemotympanum. Tympanic membrane is not injected, scarred, perforated, erythematous, retracted or bulging.     Left Ear: No laceration, drainage, swelling or tenderness. A middle ear effusion is present. There is no impacted cerumen. No foreign body. No mastoid tenderness. No PE tube. No hemotympanum. Tympanic membrane is not injected, scarred, perforated, erythematous, retracted or bulging.     Nose: Mucosal edema, congestion and rhinorrhea present. No nasal deformity or nasal tenderness.  Rhinorrhea is purulent.     Right Turbinates: Enlarged.     Left Turbinates: Enlarged.     Right Sinus: Maxillary sinus tenderness and frontal sinus tenderness present.     Left Sinus: Maxillary sinus tenderness and frontal sinus tenderness present.     Mouth/Throat:     Lips: Pink. No lesions.     Mouth: Mucous membranes are moist. No injury, lacerations, oral lesions or angioedema.     Dentition: Normal dentition.     Tongue: No lesions.     Palate: No mass.     Pharynx: Oropharynx is clear. No posterior oropharyngeal erythema or  postnasal drip.     Tonsils: No tonsillar exudate or tonsillar abscesses.  Cardiovascular:     Rate and Rhythm: Normal rate and regular rhythm.     Pulses: Normal pulses.          Radial pulses are 2+ on the right side and 2+ on the left side.       Posterior tibial pulses are 2+ on the right side and 2+ on the left side.     Heart sounds: Normal heart sounds. No murmur heard.    No gallop.  Pulmonary:     Effort: Pulmonary effort is normal. No respiratory distress.     Breath sounds: Normal breath sounds. No stridor. No wheezing, rhonchi or rales.  Musculoskeletal:     Cervical back: Full passive range of motion without pain and neck supple.     Right lower leg: No edema.     Left lower leg: No edema.  Skin:    General: Skin is warm.     Capillary Refill: Capillary refill takes less than 2 seconds.  Neurological:     General: No focal deficit present.     Mental Status: She is alert, oriented to person, place, and time and easily aroused. Mental status is at baseline.     GCS: GCS eye subscore is 4. GCS verbal subscore is 5. GCS motor subscore is 6.     Motor: No weakness.  Psychiatric:        Attention and Perception: Attention and perception normal.        Mood and Affect: Mood and affect normal.        Speech: Speech normal.        Behavior: Behavior normal. Behavior is cooperative.        Thought Content: Thought content normal. Thought content  does not include homicidal or suicidal ideation. Thought content does not include homicidal or suicidal plan.        Cognition and Memory: Cognition and memory normal.        Judgment: Judgment normal.     Results for orders placed or performed in visit on 01/10/23  VITAMIN D 25 Hydroxy (Vit-D Deficiency, Fractures)  Result Value Ref Range   Vit D, 25-Hydroxy 45.3 30.0 - 100.0 ng/mL  CBC  Result Value Ref Range   WBC 3.9 3.4 - 10.8 x10E3/uL   RBC 4.69 3.77 - 5.28 x10E6/uL   Hemoglobin 14.1 11.1 - 15.9 g/dL   Hematocrit 16.1 09.6 - 46.6 %   MCV 90 79 - 97 fL   MCH 30.1 26.6 - 33.0 pg   MCHC 33.4 31.5 - 35.7 g/dL   RDW 04.5 40.9 - 81.1 %   Platelets 261 150 - 450 x10E3/uL  Lipid panel  Result Value Ref Range   Cholesterol, Total 207 (H) 100 - 199 mg/dL   Triglycerides 51 0 - 149 mg/dL   HDL 914 >78 mg/dL   VLDL Cholesterol Cal 9 5 - 40 mg/dL   LDL Chol Calc (NIH) 94 0 - 99 mg/dL   Chol/HDL Ratio 2.0 0.0 - 4.4 ratio  TSH  Result Value Ref Range   TSH 3.390 0.450 - 4.500 uIU/mL  CMP14+EGFR  Result Value Ref Range   Glucose 84 70 - 99 mg/dL   BUN 13 6 - 24 mg/dL   Creatinine, Ser 2.95 0.57 - 1.00 mg/dL   eGFR 97 >62 ZH/YQM/5.78   BUN/Creatinine Ratio 18 9 - 23   Sodium 140 134 - 144 mmol/L  Potassium 4.4 3.5 - 5.2 mmol/L   Chloride 103 96 - 106 mmol/L   CO2 21 20 - 29 mmol/L   Calcium 9.5 8.7 - 10.2 mg/dL   Total Protein 6.8 6.0 - 8.5 g/dL   Albumin 4.4 3.8 - 4.9 g/dL   Globulin, Total 2.4 1.5 - 4.5 g/dL   Albumin/Globulin Ratio 1.8 1.2 - 2.2   Bilirubin Total 0.4 0.0 - 1.2 mg/dL   Alkaline Phosphatase 68 44 - 121 IU/L   AST 26 0 - 40 IU/L   ALT 18 0 - 32 IU/L       05/10/2023    9:32 AM 04/29/2023    3:15 PM 04/01/2023    4:04 PM 08/31/2022   11:39 AM 04/24/2022    3:41 PM  Depression screen PHQ 2/9  Decreased Interest 0 1 0 1 1  Down, Depressed, Hopeless 1 1 1 1 3   PHQ - 2 Score 1 2 1 2 4   Altered sleeping 1 2 2  0 2  Tired, decreased energy 1 2 1 1 2    Change in appetite 0 1 1 0 1  Feeling bad or failure about yourself  0 0 0 1 2  Trouble concentrating 1 1 0 0 2  Moving slowly or fidgety/restless 0 0 0 0 0  Suicidal thoughts 0 0 0 0 0  PHQ-9 Score 4 8 5 4 13   Difficult doing work/chores Somewhat difficult Somewhat difficult Somewhat difficult Somewhat difficult Very difficult       05/10/2023    9:32 AM 04/29/2023    3:41 PM 04/01/2023    4:04 PM 10/12/2022    3:53 PM  GAD 7 : Generalized Anxiety Score  Nervous, Anxious, on Edge 1 1 2 1   Control/stop worrying 1 1 1 1   Worry too much - different things 0 2 1 1   Trouble relaxing 1 1 1  0  Restless 0 2 2 0  Easily annoyed or irritable 1 2 2  0  Afraid - awful might happen 0 0 0 0  Total GAD 7 Score 4 9 9 3   Anxiety Difficulty Somewhat difficult Somewhat difficult Somewhat difficult Not difficult at all    Pertinent labs & imaging results that were available during my care of the patient were reviewed by me and considered in my medical decision making.  Assessment & Plan:  Aadya was seen today for sinus problem.  Diagnoses and all orders for this visit:  Acute non-recurrent maxillary sinusitis Will send antibiotic as below. Patient to continue to use OTC allergy medications. Discussed supportive treatments to continue at home.  -     doxycycline (VIBRA-TABS) 100 MG tablet; Take 1 tablet (100 mg total) by mouth 2 (two) times daily for 7 days.  Dizziness Patient states that she has OTC antivert that she is taking, which is helping.   Anxiety Symptoms Stable. Denies SI. Patient to follow up with PCP     Continue all other maintenance medications.  Follow up plan: Return if symptoms worsen or fail to improve.   Continue healthy lifestyle choices, including diet (rich in fruits, vegetables, and lean proteins, and low in salt and simple carbohydrates) and exercise (at least 30 minutes of moderate physical activity daily).  Written and verbal instructions provided   The above  assessment and management plan was discussed with the patient. The patient verbalized understanding of and has agreed to the management plan. Patient is aware to call the clinic if they develop any new symptoms or if  symptoms persist or worsen. Patient is aware when to return to the clinic for a follow-up visit. Patient educated on when it is appropriate to go to the emergency department.   Neale Burly, DNP-FNP [facility] [address] [place], [place] [address] [phone]

## 2023-05-14 ENCOUNTER — Ambulatory Visit: Payer: BC Managed Care – PPO | Admitting: Professional Counselor

## 2023-05-14 DIAGNOSIS — Z630 Problems in relationship with spouse or partner: Secondary | ICD-10-CM
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
## 2023-05-28 ENCOUNTER — Ambulatory Visit: Payer: BC Managed Care – PPO | Admitting: Professional Counselor

## 2023-05-28 DIAGNOSIS — Z630 Problems in relationship with spouse or partner: Secondary | ICD-10-CM | POA: Diagnosis not present

## 2023-05-28 DIAGNOSIS — F419 Anxiety disorder, unspecified: Secondary | ICD-10-CM

## 2023-05-28 NOTE — BH Specialist Note (Unsigned)
[facility] Telephone Follow-up  MRN: [id number] NAME: [facility] Date: 05/28/23  Start time: Start Time: 0330 End time: Stop Time: 0400 Total time: Total Time in Minutes (Visit): 30 Call number: Visit Number: 4- Fourth Visit  Reason for call today:  The patient is a 57 year old female returning for a collaborative care follow-up. She reports being in a positive mood but also feeling somewhat sad, as she has been thinking about her mother, who passed away last year. She attributes this increase in reflection to her husband's recent period of abstinence from alcohol, which has lessened the stress and arguments in their relationship. With this emotional space now available, she has started confronting unresolved grief related to her mother's passing. While she finds it bittersweet that things are improving with her husband, she acknowledges that this progress has allowed her to face emotions she had previously suppressed. She feels ready to embark on this emotional journey and is determined to do the work necessary to address her grief.  The patient expressed hope that her husband might pursue counseling to explore the underlying issues behind his drinking, though she is working on letting go of that concern and focusing on herself. She noticed "waiting for the shoe to drop" often suspicious if he is secretly drinking. 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 She plans to increase her self-care efforts in the coming weeks and allow herself to process long-buried emotions, including the deaths of her mother and brother. Despite these challenges, the patient reports that she has been maintaining her overall functioning and mood well.  PHQ-9 Scores:     05/10/2023    9:32 AM 04/29/2023    3:15 PM 04/01/2023    4:04 PM 08/31/2022   11:39 AM 04/24/2022    3:41 PM   Depression screen PHQ 2/9  Decreased Interest 0 1 0 1 1  Down, Depressed, Hopeless 1 1 1 1 3   PHQ - 2 Score 1 2 1 2 4   Altered sleeping 1 2 2  0 2  Tired, decreased energy 1 2 1 1 2   Change in appetite 0 1 1 0 1  Feeling bad or failure about yourself  0 0 0 1 2  Trouble concentrating 1 1 0 0 2  Moving slowly or fidgety/restless 0 0 0 0 0  Suicidal thoughts 0 0 0 0 0  PHQ-9 Score 4 8 5 4 13   Difficult doing work/chores Somewhat difficult Somewhat difficult Somewhat difficult Somewhat difficult Very difficult   GAD-7 Scores:     05/10/2023    9:32 AM 04/29/2023    3:41 PM 04/01/2023    4:04 PM 10/12/2022    3:53 PM  GAD 7 : Generalized Anxiety Score  Nervous, Anxious, on Edge 1 1 2 1   Control/stop worrying 1 1 1 1   Worry too much - different things 0 2 1 1   Trouble relaxing 1 1 1  0  Restless 0 2 2 0  Easily annoyed or irritable 1 2 2  0  Afraid - awful might happen 0 0 0 0  Total GAD 7 Score 4 9 9 3   Anxiety Difficulty Somewhat difficult Somewhat difficult Somewhat difficult Not difficult at all    Stress Current stressors:  Adjustment period Sleep:  Good Appetite:  Good Coping ability:  Good Patient taking medications as prescribed:  Yes  Current medications:  Outpatient Encounter Medications as  of 05/28/2023  Medication Sig   acetaminophen (TYLENOL) 325 MG tablet Take 650 mg by mouth every 6 (six) hours.   albuterol (VENTOLIN HFA) 108 (90 Base) MCG/ACT inhaler Inhale 2 puffs into the lungs every 6 (six) hours as needed for wheezing or shortness of breath.   ALPRAZolam (XANAX) 0.25 MG tablet TAKE 0.5 to 1 TABLET(0.25 MG) BY MOUTH qhs AS NEEDED FOR SLEEP OR ANXIETY   cetirizine (ZYRTEC) 10 MG tablet Take 10 mg by mouth daily.   desvenlafaxine (PRISTIQ) 50 MG 24 hr tablet Take 1 tablet (50 mg total) by mouth daily.   fluticasone-salmeterol (ADVAIR) 100-50 MCG/ACT AEPB Inhale 1 puff into the lungs 2 (two) times daily.   ondansetron (ZOFRAN-ODT) 4 MG disintegrating tablet Take 1  tablet (4 mg total) by mouth every 8 (eight) hours as needed for nausea or vomiting.   scopolamine (TRANSDERM-SCOP) 1 MG/3DAYS Place 1 patch (1.5 mg total) onto the skin every 3 (three) days.   SUMAtriptan (IMITREX) 50 MG tablet Take 1 tablet (50 mg total) by mouth daily as needed for Migraine (may repeat in 2 hours).   valACYclovir (VALTREX) 1000 MG tablet Take 2 tablets (2,000 mg total) by mouth 2 (two) times daily. x1 day per flare. Repeat as needed for each fever blister flare.   No facility-administered encounter medications on file as of 05/28/2023.     Self-harm Behaviors Risk Assessment Self-harm risk factors:  None Patient endorses recent thoughts of harming self: Denies    Danger to Others Risk Assessment Danger to others risk factors:  None Patient endorses recent thoughts of harming others:  Denies   Substance Use Assessment Patient recently consumed alcohol:  Yes  Alcohol Use Disorder Identification Test (AUDIT):     04/29/2023    3:42 PM  Alcohol Use Disorder Test (AUDIT)  1. How often do you have a drink containing alcohol? 1  2. How many drinks containing alcohol do you have on a typical day when you are drinking? 0  3. How often do you have six or more drinks on one occasion? 0  AUDIT-C Score 1   Goals, Interventions and Follow-up Plan Goals:  Increase self care and letting go of control Interventions: Mindfulness or Relaxation Training and CBT Cognitive Behavioral Therapy Follow-up Plan:  2 week follow up    Reuel Boom

## 2023-06-11 ENCOUNTER — Ambulatory Visit: Payer: BC Managed Care – PPO | Admitting: Professional Counselor

## 2023-06-13 ENCOUNTER — Encounter: Payer: Self-pay | Admitting: Internal Medicine

## 2023-06-18 ENCOUNTER — Ambulatory Visit (INDEPENDENT_AMBULATORY_CARE_PROVIDER_SITE_OTHER): Payer: BC Managed Care – PPO | Admitting: Nurse Practitioner

## 2023-06-18 ENCOUNTER — Encounter: Payer: Self-pay | Admitting: Nurse Practitioner

## 2023-06-18 ENCOUNTER — Ambulatory Visit: Payer: Self-pay | Admitting: Family Medicine

## 2023-06-18 ENCOUNTER — Other Ambulatory Visit: Payer: Self-pay | Admitting: Nurse Practitioner

## 2023-06-18 VITALS — BP 135/79 | HR 73 | Temp 98.2°F | Ht 64.0 in | Wt 161.6 lb

## 2023-06-18 DIAGNOSIS — R051 Acute cough: Secondary | ICD-10-CM | POA: Diagnosis not present

## 2023-06-18 DIAGNOSIS — R52 Pain, unspecified: Secondary | ICD-10-CM

## 2023-06-18 LAB — VERITOR FLU A/B WAIVED
Influenza A: NEGATIVE
Influenza B: NEGATIVE

## 2023-06-18 MED ORDER — AZITHROMYCIN 250 MG PO TABS: ORAL_TABLET | ORAL | 0 refills | Status: DC

## 2023-06-18 NOTE — Progress Notes (Addendum)
Acute Office Visit  Subjective:     Patient ID: Makayla Jackson, female    DOB: October 29, 1965, 57 y.o.   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 Pertinent negatives include no chest pain, chills, rash or vomiting.  Cough Associated symptoms include a fever, headaches and wheezing. Pertinent negatives include no chest pain, chills or rash.  Headache  Associated symptoms include coughing and a fever. Pertinent negatives include no dizziness, nausea or vomiting.   Makayla Jackson is a 57 year old female present June 18, 2023 for an acute visit for URI.  She is concerned that she may be exposed to something at work as she works as Programme researcher, broadcasting/film/video. PMH hypothyroidism, migraine 1. Cough Patient reports a  history of 4 days of productive cough.  It is worse with  and improved by notthing.  Denies chest congestion, nasal congestion, rhinorrhea, hemoptysis, shortness of breath, wheeze, chest pain, post tussive emesis, nausea, diarrhea, fever, or chills.  Patient has sick contacts, at work recent travel.  Has hx pulmonary disease including asthma "most so allergic". Denies tobacco use.  Patient is not UTD on immunizations.  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   Hyperthyroidism 05/14/2019   Arthralgia 05/14/2019   Abdominal pain, epigastric 10/23/2019   Conjunctivitis 08/14/2021   Stress due to illness of family member 08/14/2022   Resolved Ambulatory Problems    Diagnosis Date Noted   Pain of great toe, right 08/17/2016   Small bowel obstruction (HCC) 08/07/2016   Diverticulitis 11/05/2017   Nausea and vomiting 10/23/2019   Tick bite of abdomen 02/07/2021   Pharyngitis 02/21/2021   Upper respiratory infection with cough and congestion 02/21/2021   Subacute frontal sinusitis 08/14/2021   Past Medical History:  Diagnosis Date   Arthritis    Asthma    Bowel obstruction (HCC) 08/2016   Miscarriage    Multiple thyroid nodules    Osteopenia    Parathyroid tumor    Pneumonia    SBO (small bowel obstruction) (HCC)    Thyroid disease      Review of Systems  Constitutional:  Positive for fever. Negative for chills.  HENT:  Positive for congestion. Negative for sinus pain.   Respiratory:  Positive for cough and wheezing.   Cardiovascular:  Negative for chest pain and leg swelling.  Gastrointestinal:  Negative for nausea and vomiting.  Skin:  Negative for itching and rash.  Neurological:  Positive for headaches. Negative for dizziness.       That was relieved with Tylenol   Negative unless indicated in HPI    Objective:    BP 135/79   Pulse 73   Temp 98.2 F (36.8 C) (Temporal)   Ht 5\' 4"  (  1.626 m)   Wt 161 lb 9.6 oz (73.3 kg)   SpO2 96%   BMI 27.74 kg/m  BP Readings from Last 3 Encounters:  06/18/23 135/79  05/10/23 109/63  04/02/23 128/88   Wt Readings from Last 3 Encounters:  06/18/23 161 lb 9.6 oz (73.3 kg)  05/10/23 157 lb (71.2 kg)  04/01/23 157 lb (71.2 kg)      Physical Exam Vitals and nursing note reviewed.  HENT:     Head: Normocephalic and atraumatic.  Eyes:     General: No scleral icterus.    Extraocular Movements: Extraocular movements intact.     Conjunctiva/sclera:  Conjunctivae normal.     Pupils: Pupils are equal, round, and reactive to light.  Cardiovascular:     Rate and Rhythm: Normal rate and regular rhythm.  Pulmonary:     Effort: Pulmonary effort is normal.     Breath sounds: Normal breath sounds. No wheezing.  Musculoskeletal:        General: Normal range of motion.     Cervical back: Normal range of motion and neck supple.     Right lower leg: No edema.     Left lower leg: No edema.  Skin:    General: Skin is warm and dry.     Findings: No rash.  Neurological:     Mental Status: She is oriented to person, place, and time. Mental status is at baseline.  Psychiatric:        Mood and Affect: Mood normal.        Behavior: Behavior normal.        Thought Content: Thought content normal.        Judgment: Judgment normal.     No results found for any visits on 06/18/23.      Assessment & Plan:  Acute cough -     Novel Coronavirus, NAA ([facility]) -     Veritor Flu A/B Waived  Body aches -     Novel Coronavirus, NAA ([facility]) -     Veritor Flu A/B Roze Haverland is a 57 year old Caucasian female, no acute distress Cough possible bronchitis but started her on Z-Pak No. 6 dispensed Client instructed to get over-the-counter Coricidin for the cough POC for negative: Waiting for COVID results Client is instructed if possible to take at home COVID test and to call the clinic with if positive Tylenol/ibuprofen for fever/headache Increase hydration Return to school note provided It appears that you have a viral upper respiratory infection (cold).  Cold symptoms can last up to 2 weeks.  I recommend that you only use cold medications that are safe in high blood pressure like Coricidin (generic is fine).  Other cold medications can increase your blood pressure.    - Get plenty of rest and drink plenty of fluids. - Try to breathe moist air. Use a cold mist humidifier. - Consume warm fluids (soup or tea) to provide relief for a stuffy nose  and to loosen phlegm. - For nasal stuffiness, try saline nasal spray or a Neti Pot.  Afrin nasal spray can also be used but this product should not be used longer than 3 days or it will cause rebound nasal stuffiness (worsening nasal congestion). - For sore throat pain relief: use chloraseptic spray, suck on throat lozenges, hard candy or popsicles; gargle with warm salt water (1/4 tsp. salt per 8 oz. of water); and eat soft, bland foods. - Eat a well-balanced diet. If you cannot, ensure you are getting  enough nutrients by taking a daily multivitamin. - Avoid dairy products, as they can thicken phlegm. - Avoid alcohol, as it impairs your body's immune system.  CONTACT YOUR DOCTOR IF YOU EXPERIENCE ANY OF THE FOLLOWING: - High fever - Ear pain - Sinus-type headache - Unusually severe cold symptoms - Cough that gets worse while other cold symptoms improve - Flare up of any chronic lung problem, such as asthma - Your symptoms persist longer than 2 weeks  Return if symptoms worsen or fail to improve.  Arrie Aran Santa Lighter, DNP [facility] [address] [place], [place] [address] [phone]

## 2023-06-18 NOTE — Patient Instructions (Signed)

## 2023-06-18 NOTE — Telephone Encounter (Signed)
Copied from CRM [id number]. Topic: Clinical - Red Word Triage >> Jun 18, 2023  8:18 AM Deaijah H wrote: Red Word that prompted transfer to Nurse Triage: Breathing issues/wheezing/ inhaler not working  Chief Complaint: wheezing, Symptoms: SOB, congestion Frequency: ongoing x 3 days Pertinent Negatives: Patient denies chest pain or respiratory distress Disposition: [] ED /[] Urgent Care (no appt availability in office) / [x] Appointment(In office/virtual)/ []  [place] [facility]/ [] Home Care/ [] Refused Recommended Disposition /[] [place] [facility]/ []  Follow-up with PCP Additional Notes: patient c/o wheezing, SOB, congestion and currently using inhalers. Pt stated history of asthma.  Appt set 06/18/2023 at 4pm per patient no morning appt.  Reason for Disposition  [1] MILD difficulty breathing (e.g., minimal/no SOB at rest, SOB with walking, pulse <100) AND [2] NEW-onset or WORSE than normal  Answer Assessment - Initial Assessment Questions 1. RESPIRATORY STATUS: "Describe your breathing?" (e.g., wheezing, shortness of breath, unable to speak, severe coughing)     Wheezing, coughing, some SOB 2. ONSET: "When did this breathing problem begin?"      3 days 3. PATTERN "Does the difficult breathing come and go, or has it been constant since it started?"      Come and go and now it has been more constant 4. SEVERITY: "How bad is your breathing?" (e.g., mild, moderate, severe)    - MILD: No SOB at rest, mild SOB with walking, speaks normally in sentences, can lie down, no retractions, pulse < 100.    - MODERATE: SOB at rest, SOB with minimal exertion and prefers to sit, cannot lie down flat, speaks in phrases, mild retractions, audible wheezing, pulse 100-120.    - SEVERE: Very SOB at rest, speaks in single words, struggling to breathe, sitting hunched forward, retractions, pulse > 120      mild 5. RECURRENT SYMPTOM: "Have you had difficulty breathing before?" If Yes, ask: "When was the last  time?" and "What happened that time?"      Yes - asthma 6. CARDIAC HISTORY: "Do you have any history of heart disease?" (e.g., heart attack, angina, bypass surgery, angioplasty)      N/a 7. LUNG HISTORY: "Do you have any history of lung disease?"  (e.g., pulmonary embolus, asthma, emphysema)     asthma 8. CAUSE: "What do you think is causing the breathing problem?"      unknown 9. OTHER SYMPTOMS: "Do you have any other symptoms? (e.g., dizziness, runny nose, cough, chest pain, fever)     Congestion, head stuffy, cough, SOB, wheezing 10. O2 SATURATION MONITOR:  "Do you use an oxygen saturation monitor (pulse oximeter) at home?" If Yes, ask: "What is your reading (oxygen level) today?" "What is your usual oxygen saturation reading?" (e.g., 95%)       unknown 11. PREGNANCY: "Is there any chance you are pregnant?" "When was your last menstrual period?"       no 12. TRAVEL: "Have you traveled out of the country in the last month?" (e.g., travel history, exposures)       no  Protocols used: Breathing Difficulty-A-AH

## 2023-06-19 LAB — NOVEL CORONAVIRUS, NAA: SARS-CoV-2, NAA: NOT DETECTED

## 2023-06-20 ENCOUNTER — Ambulatory Visit: Payer: BC Managed Care – PPO | Admitting: Nurse Practitioner

## 2023-06-20 ENCOUNTER — Ambulatory Visit: Payer: Self-pay | Admitting: Family Medicine

## 2023-06-20 ENCOUNTER — Encounter: Payer: Self-pay | Admitting: Nurse Practitioner

## 2023-06-20 VITALS — BP 118/81 | HR 87 | Temp 97.4°F | Ht 64.0 in | Wt 161.0 lb

## 2023-06-20 DIAGNOSIS — R051 Acute cough: Secondary | ICD-10-CM | POA: Diagnosis not present

## 2023-06-20 MED ORDER — HYDROCODONE BIT-HOMATROP MBR 5-1.5 MG/5ML PO SOLN: 5.0000 mL | Freq: Four times a day (QID) | ORAL | 0 refills | Status: DC | PRN

## 2023-06-20 MED ORDER — PREDNISONE 20 MG PO TABS: 40.0000 mg | ORAL_TABLET | Freq: Every day | ORAL | 0 refills | Status: AC

## 2023-06-20 NOTE — Patient Instructions (Signed)

## 2023-06-20 NOTE — Telephone Encounter (Signed)
Appt today

## 2023-06-20 NOTE — Telephone Encounter (Signed)
Copied from CRM [id number]. Topic: Clinical - Red Word Triage >> Jun 20, 2023  8:06 AM Dennison Nancy wrote: Red Word that prompted transfer to Nurse Triage: Wheezing more than was before ,was seen on tuesday this week , thought having the flu been on antibotic for 2 days, patient notice even with inhaler and antibotic temperature going up at night last night was 101 . Call back #  [phone]  Chief Complaint: wheezing and fever Symptoms: wheezing, fever, SOB Frequency: ongoing  Pertinent Negatives: Patient denies chest pain Disposition: [] ED /[] Urgent Care (no appt availability in office) / [x] Appointment(In office/virtual)/ []  [place] [place] [facility]/ [] Home Care/ [] Refused Recommended Disposition /[] [place] [facility]/ []  Follow-up with PCP Additional Notes: pt stated seen in office and now on antibiotics however wheezing getting worse: would like appt with PCP: nurse informed no available with PCP schedule appt with another provider today at 8:45 am.  Pt stated no pleased with new answering service and having to talk with someone who does not know you personally - stated not because staff not nice but does not like it and will be making it known: thanked me for my services today and stated will be on her to the appt now. Reason for Disposition  [1] MILD difficulty breathing (e.g., minimal/no SOB at rest, SOB with walking, pulse <100) AND [2] NEW-onset or WORSE than normal  Answer Assessment - Initial Assessment Questions 1. RESPIRATORY STATUS: "Describe your breathing?" (e.g., wheezing, shortness of breath, unable to speak, severe coughing)      Wheezing, coughing, fever 102 at night, SOB 2. ONSET: "When did this breathing problem begin?"      Since Sunday 3. PATTERN "Does the difficult breathing come and go, or has it been constant since it started?"      Ongoing and getting worse 4. SEVERITY: "How bad is your breathing?" (e.g., mild, moderate, severe)    - MILD: No SOB at rest, mild SOB  with walking, speaks normally in sentences, can lie down, no retractions, pulse < 100.    - MODERATE: SOB at rest, SOB with minimal exertion and prefers to sit, cannot lie down flat, speaks in phrases, mild retractions, audible wheezing, pulse 100-120.    - SEVERE: Very SOB at rest, speaks in single words, struggling to breathe, sitting hunched forward, retractions, pulse > 120      moderate 5. RECURRENT SYMPTOM: "Have you had difficulty breathing before?" If Yes, ask: "When was the last time?" and "What happened that time?"      Yes -especially in winter months 6. CARDIAC HISTORY: "Do you have any history of heart disease?" (e.g., heart attack, angina, bypass surgery, angioplasty)      Unknown 7. LUNG HISTORY: "Do you have any history of lung disease?"  (e.g., pulmonary embolus, asthma, emphysema)     asthma 8. CAUSE: "What do you think is causing the breathing problem?"      unknown 9. OTHER SYMPTOMS: "Do you have any other symptoms? (e.g., dizziness, runny nose, cough, chest pain, fever)     Fever 102 10. O2 SATURATION MONITOR:  "Do you use an oxygen saturation monitor (pulse oximeter) at home?" If Yes, ask: "What is your reading (oxygen level) today?" "What is your usual oxygen saturation reading?" (e.g., 95%)       unknown 11. PREGNANCY: "Is there any chance you are pregnant?" "When was your last menstrual period?"       no 12. TRAVEL: "Have you traveled out of the country in the  last month?" (e.g., travel history, exposures)       no  Protocols used: Breathing Difficulty-A-AH

## 2023-06-20 NOTE — Progress Notes (Signed)
Subjective:    Patient ID: Makayla Jackson, female    DOB: [birth year], 57 y.o.   MRN: [id number]   Chief Complaint: Wheezing (Fever, shortness or breath.)   Patien was seen Tuesday by Makayla Davenport, FNP. Was dx with uri. Covid negative and flu was negative. She was given a zpak and all that has done is upset her stomach.  Wheezing  This is a new problem. The current episode started in the past 7 days. The problem has been waxing and waning. Associated symptoms include chills, coughing, rhinorrhea and shortness of breath. Nothing aggravates the symptoms. She has tried OTC cough suppressant for the symptoms. The treatment provided mild relief.    Patient Active Problem List   Diagnosis Date Noted   Stress due to illness of family member 08/14/2022   Conjunctivitis 08/14/2021   Abdominal pain, epigastric 10/23/2019   Hyperthyroidism 05/14/2019   Arthralgia 05/14/2019   Irritable bowel syndrome with both constipation and diarrhea 06/18/2018   Hyperparathyroidism (HCC) 10/01/2017   History of small bowel obstruction 10/18/2016   Anxiety 07/22/2015   Papule of skin 02/08/2015   Migraine with aura and without status migrainosus, not intractable 12/21/2013   Constipation 08/20/2012   Hemorrhagic ovarian cyst 08/20/2012   Multinodular goiter 05/25/2012       Review of Systems  Constitutional:  Positive for chills.  HENT:  Positive for rhinorrhea.   Respiratory:  Positive for cough, shortness of breath and wheezing.        Objective:   Physical Exam Vitals and nursing note reviewed.  Constitutional:      General: She is not in acute distress.    Appearance: Normal appearance. She is well-developed.  HENT:     Head: Normocephalic.     Right Ear: Tympanic membrane normal.     Left Ear: Tympanic membrane normal.     Nose: Nose normal.     Mouth/Throat:     Mouth: Mucous membranes are moist.  Eyes:     Pupils: Pupils are equal, round, and reactive to light.  Neck:     Vascular:  No carotid bruit or JVD.  Cardiovascular:     Rate and Rhythm: Normal rate and regular rhythm.     Heart sounds: Normal heart sounds.  Pulmonary:     Effort: Pulmonary effort is normal. No respiratory distress.     Breath sounds: Wheezing (exp in bil lower lobes) present. No rales.  Chest:     Chest wall: No tenderness.  Abdominal:     General: Bowel sounds are normal. There is no distension or abdominal bruit.     Palpations: Abdomen is soft. There is no hepatomegaly, splenomegaly, mass or pulsatile mass.     Tenderness: There is no abdominal tenderness.  Musculoskeletal:        General: Normal range of motion.     Cervical back: Normal range of motion and neck supple.  Lymphadenopathy:     Cervical: No cervical adenopathy.  Skin:    General: Skin is warm and dry.  Neurological:     Mental Status: She is alert and oriented to person, place, and time.     Deep Tendon Reflexes: Reflexes are normal and symmetric.  Psychiatric:        Behavior: Behavior normal.        Thought Content: Thought content normal.        Judgment: Judgment normal.    BP 118/81   Pulse 87   Temp (!) 97.4 F (36.3  C)   Ht 5\' 4"  (1.626 m)   Wt 161 lb (73 kg)   SpO2 96%   BMI 27.64 kg/m         Assessment & Plan:   Makayla Jackson in today with chief complaint of Wheezing (Fever, shortness or breath.)   1. Acute cough 1. Take meds as prescribed 2. Use a cool mist humidifier especially during the winter months and when heat has been humid. 3. Use saline nose sprays frequently 4. Saline irrigations of the nose can be very helpful if done frequently.  * 4X daily for 1 week*  * Use of a nettie pot can be helpful with this. Follow directions with this* 5. Drink plenty of fluids 6. Keep thermostat turn down low 7.For any cough or congestion- hycodan  8. For fever or aces or pains- take tylenol or ibuprofen appropriate for age and weight.  * for fevers greater than 101 orally you may alternate  ibuprofen and tylenol every  3 hours.    Meds ordered this encounter  Medications   predniSONE (DELTASONE) 20 MG tablet    Sig: Take 2 tablets (40 mg total) by mouth daily with breakfast for 5 days. 2 po daily for 5 days    Dispense:  10 tablet    Refill:  0    Order Specific Question:   Supervising Provider    Answer:   Arville Care A [[id number]]   HYDROcodone bit-homatropine (HYCODAN) 5-1.5 MG/5ML syrup    Sig: Take 5 mLs by mouth every 6 (six) hours as needed for cough.    Dispense:  120 mL    Refill:  0    Order Specific Question:   Supervising Provider    Answer:   Arville Care A [[id number]]    The above assessment and management plan was discussed with the patient. The patient verbalized understanding of and has agreed to the management plan. Patient is aware to call the clinic if symptoms persist or worsen. Patient is aware when to return to the clinic for a follow-up visit. Patient educated on when it is appropriate to go to the emergency department.   Makayla Daphine Deutscher, FNP

## 2023-06-25 ENCOUNTER — Ambulatory Visit: Payer: BC Managed Care – PPO | Admitting: Professional Counselor

## 2023-06-26 ENCOUNTER — Encounter: Payer: Self-pay | Admitting: Family Medicine

## 2023-07-01 ENCOUNTER — Ambulatory Visit: Payer: BC Managed Care – PPO | Admitting: Family Medicine

## 2023-07-09 ENCOUNTER — Encounter: Payer: Self-pay | Admitting: Family Medicine

## 2023-07-27 IMAGING — MG MM DIGITAL SCREENING BILAT W/ TOMO AND CAD
8 series · 8 of 24 positions shown · non-contrast
Comparison: Previous exam(s).

CLINICAL DATA: Screening.

EXAM:
DIGITAL SCREENING BILATERAL MAMMOGRAM WITH TOMOSYNTHESIS AND CAD
TECHNIQUE: Bilateral screening digital craniocaudal and mediolateral oblique
mammograms were obtained. Bilateral screening digital breast
tomosynthesis was performed. The images were evaluated with
computer-aided detection.

[L CC synth-2D]
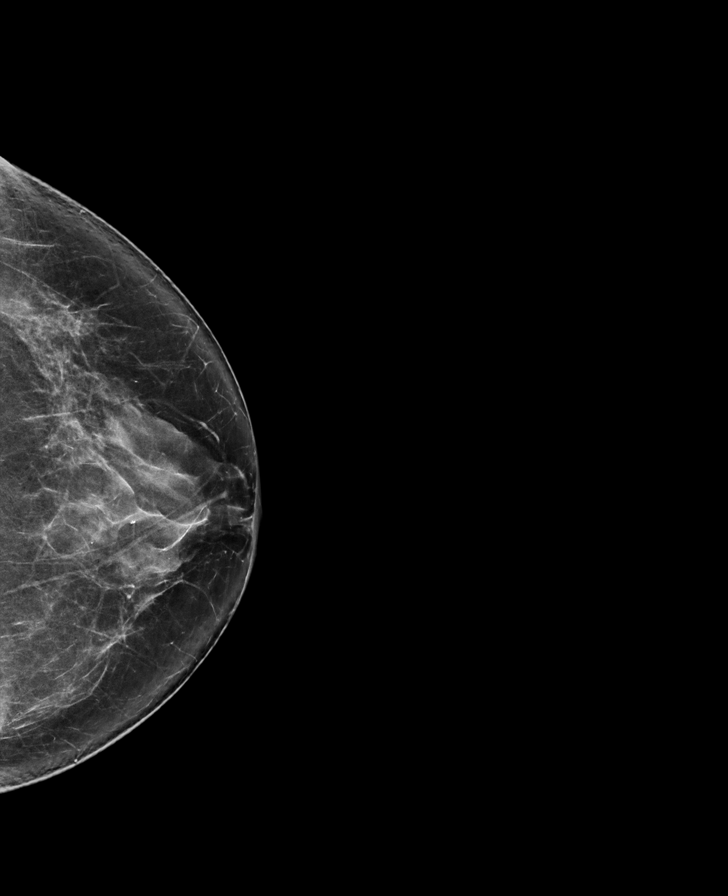

[R MLO synth-2D]
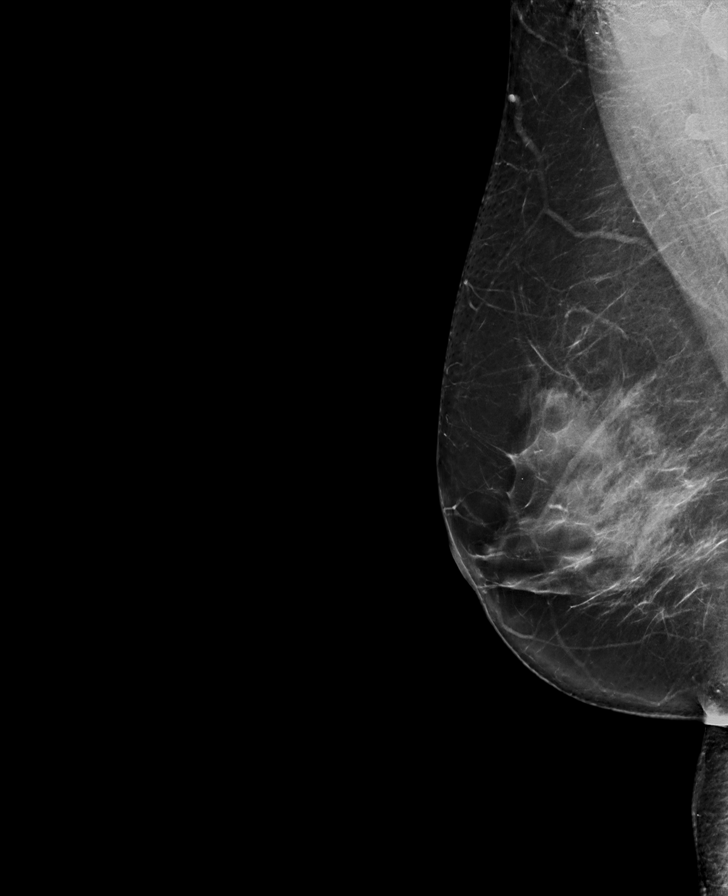

[R CC synth-2D]
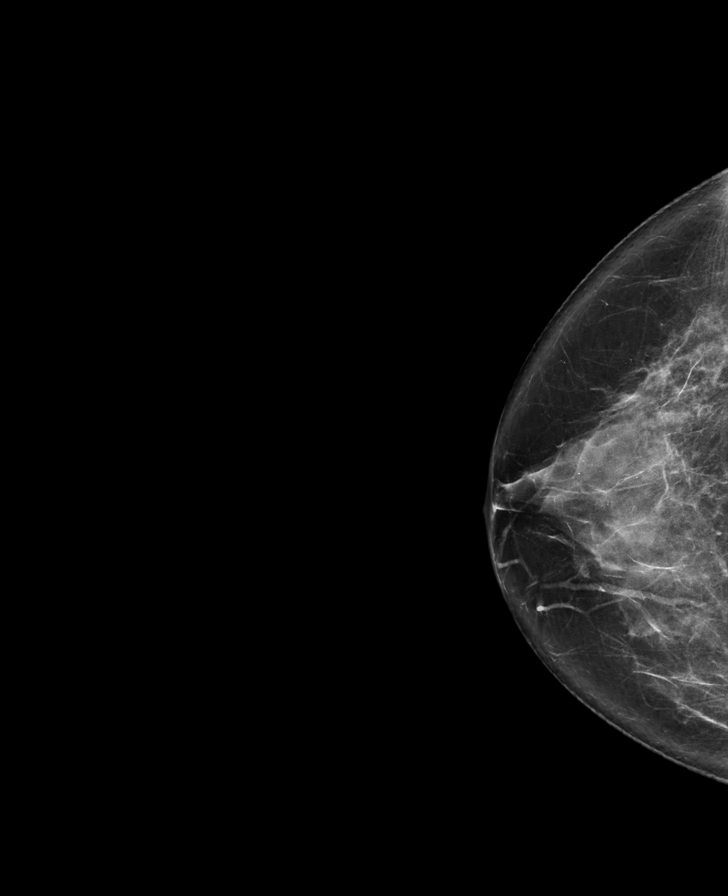

[L MLO synth-2D]
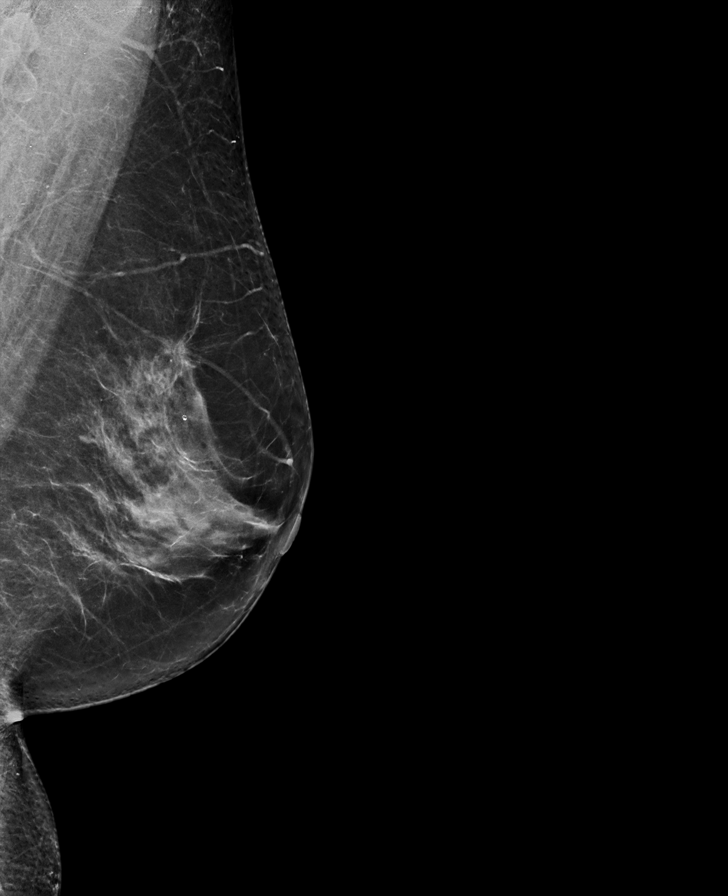

[R CC tomo · tomo slice 40/79.0]
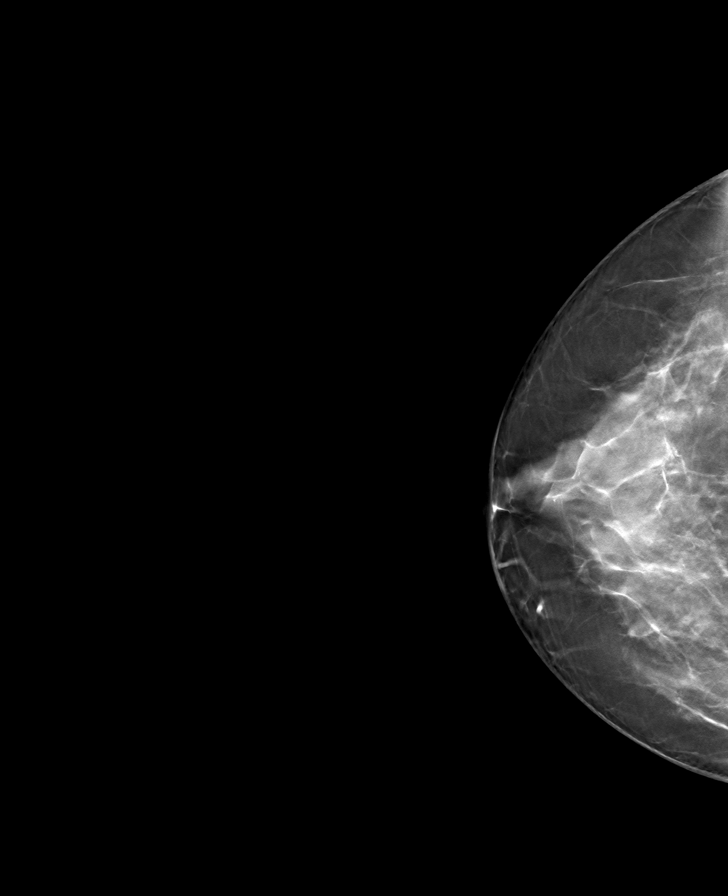

[R MLO tomo · tomo slice 43/86.0]
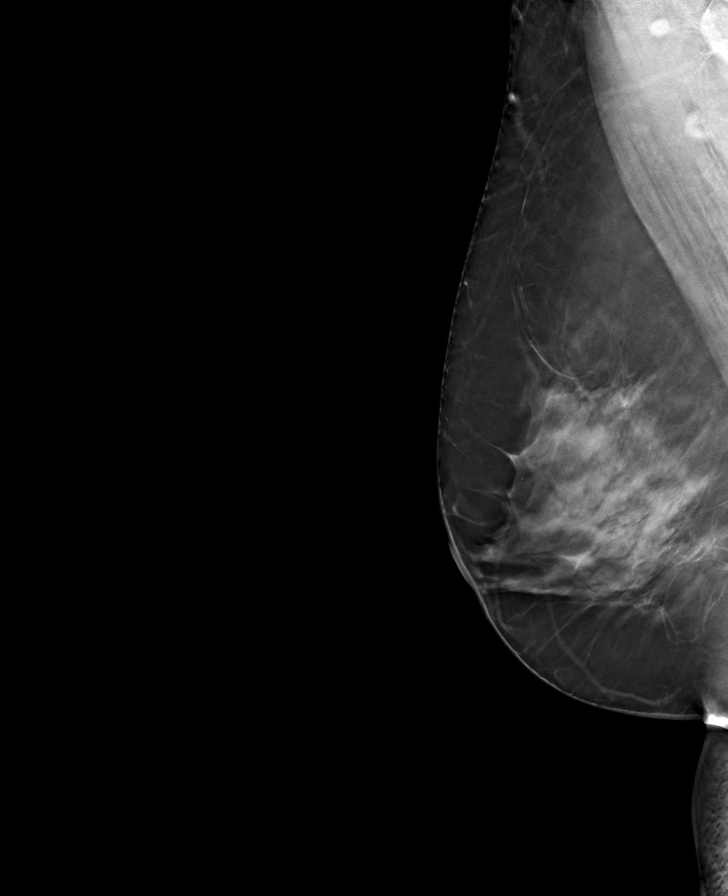

[L CC tomo · tomo slice 40/79.0]
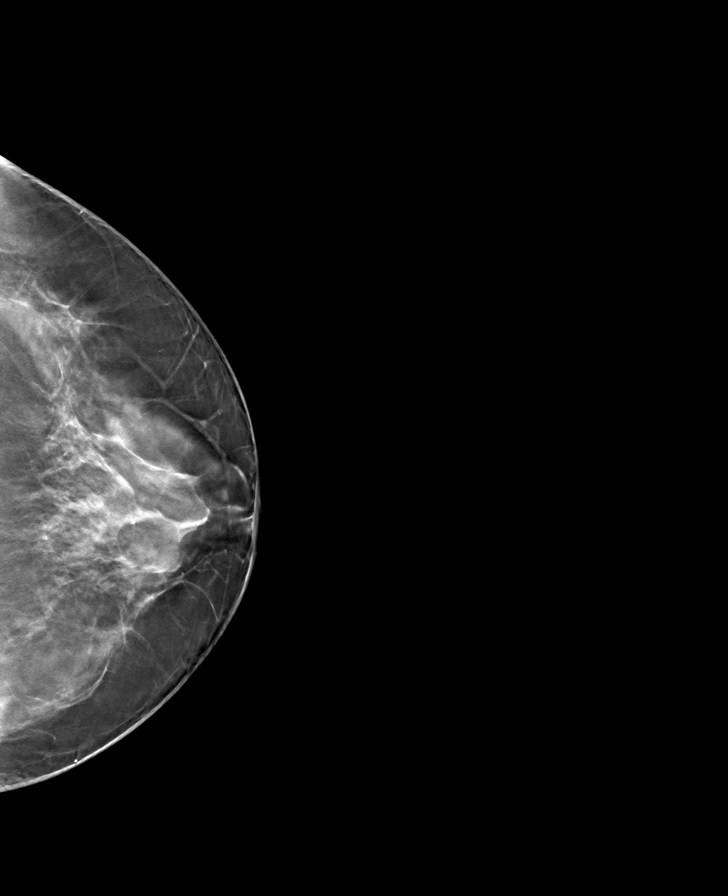

[L MLO tomo · tomo slice 40/79.0]
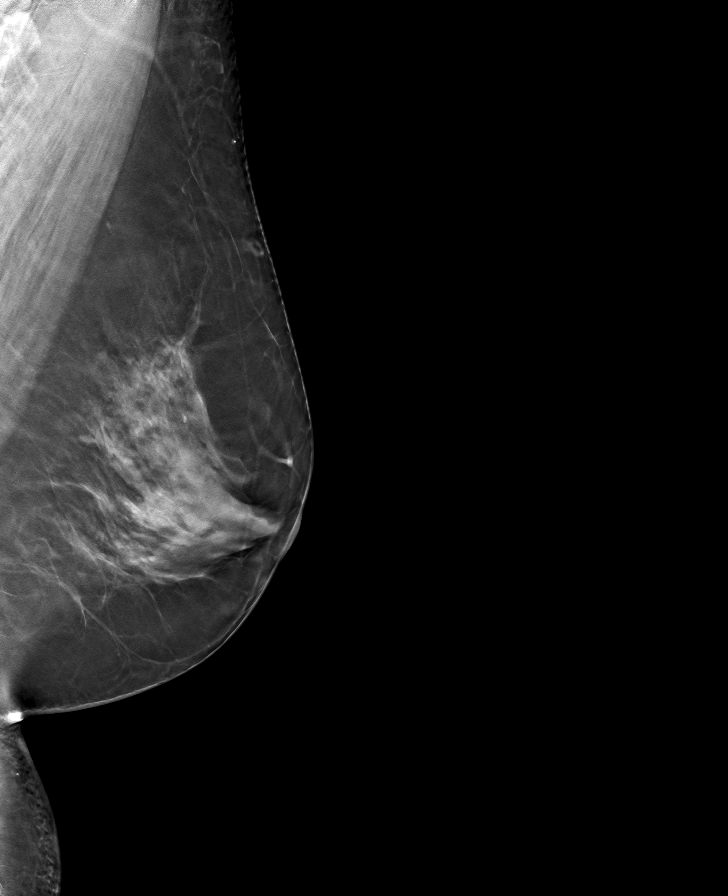

[8 of 24 positions shown; findings below may reference images not displayed]

ACR Breast Density Category c: The breast tissue is heterogeneously
dense, which may obscure small masses.
FINDINGS: There are no findings suspicious for malignancy.
IMPRESSION: No mammographic evidence of malignancy. A result letter of this
screening mammogram will be mailed directly to the patient.

RECOMMENDATION:
Screening mammogram in one year. (Code:Q3-W-BC3)

BI-RADS CATEGORY  1: Negative.

## 2023-08-05 ENCOUNTER — Ambulatory Visit: Payer: BC Managed Care – PPO | Admitting: Family Medicine

## 2023-08-05 ENCOUNTER — Encounter: Payer: Self-pay | Admitting: Family Medicine

## 2023-08-05 VITALS — BP 101/66 | HR 82 | Temp 98.4°F | Ht 64.0 in | Wt 158.4 lb

## 2023-08-05 DIAGNOSIS — K529 Noninfective gastroenteritis and colitis, unspecified: Secondary | ICD-10-CM

## 2023-08-05 DIAGNOSIS — E860 Dehydration: Secondary | ICD-10-CM

## 2023-08-05 DIAGNOSIS — F419 Anxiety disorder, unspecified: Secondary | ICD-10-CM | POA: Diagnosis not present

## 2023-08-05 DIAGNOSIS — Z630 Problems in relationship with spouse or partner: Secondary | ICD-10-CM

## 2023-08-05 MED ORDER — ONDANSETRON 4 MG PO TBDP
4.0000 mg | ORAL_TABLET | Freq: Once | ORAL | Status: AC
  Administered 2023-08-05: 4 mg via ORAL

## 2023-08-05 MED ORDER — ONDANSETRON 4 MG PO TBDP: 4.0000 mg | ORAL_TABLET | Freq: Three times a day (TID) | ORAL | 0 refills | Status: DC | PRN

## 2023-08-05 NOTE — Progress Notes (Signed)
Subjective: CC: Chronic follow-up PCP: Raliegh Ip, DO ZOX:WRUEA B Makayla Jackson is a 57 y.o. female presenting to clinic today for:  1.  Reactive depression, stress Patient reports that she self discontinued the Pristiq because the situation at home has been super good.  She has not utilized alprazolam in a while now and does not need any refills.  2.  GI illness Her biggest concern is that she has had a GI illness for the last several hours where she has been vomiting and had loose stools that is nonbloody.  She ate some Cajun salmon yesterday and is not sure if this is the etiology.  Her mother-in-law apparently sick with similar but they have not being in contact since Christmas.  She reports no fevers.  She was able to tolerate a little bit of fluid this morning.  ROS: Per HPI  Allergies  Allergen Reactions   Other Anaphylaxis and Hives    Bee stings   Shellfish Allergy Swelling   Penicillins Hives   Sulfa Antibiotics Hives   Prednisone     Pt stated, "It made me feel slightly agitated. I could not sleep well"   Past Medical History:  Diagnosis Date   Arthritis    Asthma    Bowel obstruction (HCC) 08/2016   Diverticulitis    Miscarriage    Multiple thyroid nodules    Osteopenia    Parathyroid tumor    Pneumonia    SBO (small bowel obstruction) (HCC)    Thyroid disease     Current Outpatient Medications:    acetaminophen (TYLENOL) 325 MG tablet, Take 650 mg by mouth every 6 (six) hours., Disp: , Rfl:    albuterol (VENTOLIN HFA) 108 (90 Base) MCG/ACT inhaler, Inhale 2 puffs into the lungs every 6 (six) hours as needed for wheezing or shortness of breath., Disp: 8 g, Rfl: 0   ALPRAZolam (XANAX) 0.25 MG tablet, TAKE 0.5 to 1 TABLET(0.25 MG) BY MOUTH qhs AS NEEDED FOR SLEEP OR ANXIETY, Disp: 30 tablet, Rfl: 1   azithromycin (ZITHROMAX Z-PAK) 250 MG tablet, As directed: take 2-tabs day and continue with 1-tab daily until done, Disp: 6 tablet, Rfl: 0   cetirizine (ZYRTEC)  10 MG tablet, Take 10 mg by mouth daily., Disp: , Rfl:    fluticasone-salmeterol (ADVAIR) 100-50 MCG/ACT AEPB, Inhale 1 puff into the lungs 2 (two) times daily., Disp: 180 each, Rfl: 3   HYDROcodone bit-homatropine (HYCODAN) 5-1.5 MG/5ML syrup, Take 5 mLs by mouth every 6 (six) hours as needed for cough., Disp: 120 mL, Rfl: 0   ondansetron (ZOFRAN-ODT) 4 MG disintegrating tablet, Take 1 tablet (4 mg total) by mouth every 8 (eight) hours as needed for nausea or vomiting., Disp: 20 tablet, Rfl: 0   scopolamine (TRANSDERM-SCOP) 1 MG/3DAYS, Place 1 patch (1.5 mg total) onto the skin every 3 (three) days., Disp: 10 patch, Rfl: 12   SUMAtriptan (IMITREX) 50 MG tablet, Take 1 tablet (50 mg total) by mouth daily as needed for Migraine (may repeat in 2 hours)., Disp: 10 tablet, Rfl: 3   valACYclovir (VALTREX) 1000 MG tablet, Take 2 tablets (2,000 mg total) by mouth 2 (two) times daily. x1 day per flare. Repeat as needed for each fever blister flare., Disp: 20 tablet, Rfl: 0  Current Facility-Administered Medications:    ondansetron (ZOFRAN-ODT) disintegrating tablet 4 mg, 4 mg, Oral, Once,  Social History   Socioeconomic History   Marital status: Married    Spouse name: Not on file   Number  of children: 3   Years of education: Not on file   Highest education level: Not on file  Occupational History   Occupation: Magazine features editor: [employer]  Tobacco Use   Smoking status: Never   Smokeless tobacco: Never  Vaping Use   Vaping status: Never Used  Substance and Sexual Activity   Alcohol use: Yes    Alcohol/week: 1.0 standard drink of alcohol    Types: 1 Glasses of wine per week    Comment: occasional glass of wine    Drug use: No   Sexual activity: Not on file  Other Topics Concern   Not on file  Social History Narrative   The patient is married.  Her husband is retiring as Lawyer of [employer] after serving in [place].   She has 3 children   She teaches  school in [facility]   Drinks 2 glasses of wine daily, no tobacco no drug use   Social Drivers of Community education officer: Not on file  Food Insecurity: Not on file  Transportation Needs: Not on file  Physical Activity: Not on file  Stress: Not on file  Social Connections: Not on file  Intimate Partner Violence: Not on file   Family History  Problem Relation Age of Onset   Arthritis Mother    Arthritis/Rheumatoid Mother    Asthma Mother    Heart disease Mother    Hypertension Mother    Stroke Mother    Vision loss Mother    Thyroid disease Mother    Colon polyps Mother    Thyroid cancer Mother    Arthritis Father    Heart disease Father    Hypertension Father    Colon polyps Father    Diverticulitis Father        with colon surgery   Arthritis/Rheumatoid Maternal Aunt    Arthritis/Rheumatoid Maternal Uncle    Diabetes Maternal Grandmother    Lung cancer Maternal Grandfather    Cancer Paternal Grandfather        type unknown   Colon cancer Cousin    Thyroid cancer Cousin    Breast cancer Cousin     Objective: Office vital signs reviewed. BP 101/66   Pulse 82   Temp 98.4 F (36.9 C)   Ht 5\' 4"  (1.626 m)   Wt 158 lb 6.4 oz (71.8 kg)   SpO2 93%   BMI 27.19 kg/m   Physical Examination:  General: Awake, alert, appears ill.   GI: Soft, nontender, nondistended.  After 2 unsuccessful attempts to gain access by 2 other providers, Dahlia Client, CMA was able to get a line started in the right hand and 500cc of NS delivered over the course of 1 hour   Assessment/ Plan: 57 y.o. female   Marital stress  Anxiety  Gastroenteritis - Plan: ondansetron (ZOFRAN-ODT) disintegrating tablet 4 mg, ondansetron (ZOFRAN-ODT) 4 MG disintegrating tablet  Dehydration  Anxiety/ depression stable without meds.  Pristiq and benzo discontinued since not using.  Main problem today was gastroenteritis.  She was given a Zofran in office with some symptomatic relief.  I  have renewed her Zofran that she has this on hand.  She is also given 1/2 L of saline here in office and this did improve her symptomology as well.  She is able to tolerate p.o. fluids prior to discharge.  Also gave her information on the mobile IV fluid band should she need it   Sevag Shearn  Hulen Skains, DO [facility] [phone]

## 2023-08-13 ENCOUNTER — Ambulatory Visit: Payer: 59 | Admitting: Professional Counselor

## 2023-08-13 DIAGNOSIS — Z630 Problems in relationship with spouse or partner: Secondary | ICD-10-CM

## 2023-08-13 NOTE — BH Specialist Note (Signed)
 [place] Virtual BH Telephone Follow-up  MRN: [id number] NAME: Makayla Jackson Date: 08/13/23  Start time: Start Time: 0345 End time: Stop Time: 0415 Total time: Total Time in Minutes (Visit): 30 Call number: Visit Number: 5-Fifth Visit  Reason for call today: The patient is a 58 year old female who presented for a collaborative care follow-up after not being seen for over a month. She reports significant improvement in her overall well-being, noting that her husband has remained sober, which has greatly reduced her anxiety. His sobriety has alleviated much of the marital conflict and fear that were previously chief concerns for her. She shared that the holidays were enjoyable and busy, and without the constant worry about her husband's drinking, she has been able to focus more on herself and begin processing unresolved grief related to the loss of her mother and brother.  Despite these positive changes, the patient acknowledges that she still struggles to prioritize herself. 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 During the session, the behavioral counselor and the patient discussed these dynamics and explored actionable steps she can take to proactively prioritize self-care. The patient plans to incorporate hobbies and leisure activities she enjoys into her routine and will begin focusing on this in the coming weeks.  A follow-up appointment is scheduled in two weeks to review her progress and provide continued support in her journey toward greater self-care and emotional well-being.   PHQ-9 Scores:     08/13/2023    3:51 PM 08/05/2023    2:11 PM 06/18/2023    3:47 PM 05/28/2023    3:52 PM 05/10/2023    9:32 AM  Depression screen PHQ 2/9  Decreased Interest 1 1 1  0 0  Down, Depressed, Hopeless 0 1 1 0 1  PHQ - 2 Score 1 2 2  0 1  Altered sleeping 1 1 1 1 1   Tired, decreased energy 1 0 1 3 1   Change in appetite 0  0 0 0 0  Feeling bad or failure about yourself  0 0 0 0 0  Trouble concentrating 0 0 0 0 1  Moving slowly or fidgety/restless 0 0 0 0 0  Suicidal thoughts 0 0 0 0 0  PHQ-9 Score 3 3 4 4 4   Difficult doing work/chores Somewhat difficult Not difficult at all Somewhat difficult Somewhat difficult Somewhat difficult   GAD-7 Scores:     08/13/2023    3:52 PM 08/05/2023    2:11 PM 06/18/2023    3:47 PM 05/28/2023    3:54 PM  GAD 7 : Generalized Anxiety Score  Nervous, Anxious, on Edge 1 1 1 2   Control/stop worrying 0 1 1 1   Worry too much - different things 0 1 1 1   Trouble relaxing 1 1 1  0  Restless 0 0 0 0  Easily annoyed or irritable 0 0 1 2  Afraid - awful might happen 0 0 0 0  Total GAD 7 Score 2 4 5 6   Anxiety Difficulty Somewhat difficult  Somewhat difficult Somewhat difficult    Stress Current stressors:  Work  Sleep:  fair Appetite:  Good Coping ability:  good Patient taking medications as prescribed:  Yes  Current medications:  Outpatient Encounter Medications as of 08/13/2023  Medication Sig   acetaminophen (TYLENOL) 325 MG tablet Take 650 mg by mouth every 6 (six) hours.   albuterol  (VENTOLIN  HFA) 108 (90 Base) MCG/ACT inhaler Inhale 2 puffs into the lungs  every 6 (six) hours as needed for wheezing or shortness of breath.   cetirizine (ZYRTEC) 10 MG tablet Take 10 mg by mouth daily.   fluticasone -salmeterol (ADVAIR) 100-50 MCG/ACT AEPB Inhale 1 puff into the lungs 2 (two) times daily.   ondansetron  (ZOFRAN -ODT) 4 MG disintegrating tablet Take 1 tablet (4 mg total) by mouth every 8 (eight) hours as needed for nausea or vomiting.   scopolamine  (TRANSDERM-SCOP) 1 MG/3DAYS Place 1 patch (1.5 mg total) onto the skin every 3 (three) days.   SUMAtriptan  (IMITREX ) 50 MG tablet Take 1 tablet (50 mg total) by mouth daily as needed for Migraine (may repeat in 2 hours).   valACYclovir  (VALTREX ) 1000 MG tablet Take 2 tablets (2,000 mg total) by mouth 2 (two) times daily. x1 day per  flare. Repeat as needed for each fever blister flare.   No facility-administered encounter medications on file as of 08/13/2023.     Self-harm Behaviors Risk Assessment Self-harm risk factors:   Patient endorses recent thoughts of harming self:    [place] Suicide Severity Rating Scale: Failed to redirect to the Timeline version of the REVFS SmartLink.   Danger to Others Risk Assessment Danger to others risk factors:   Patient endorses recent thoughts of harming others:    Dynamic Appraisal of Situational Aggression (DASA):      No data to display           Substance Use Assessment Patient recently consumed alcohol:    Alcohol Use Disorder Identification Test (AUDIT):     04/29/2023    3:42 PM  Alcohol Use Disorder Test (AUDIT)  1. How often do you have a drink containing alcohol? 1  2. How many drinks containing alcohol do you have on a typical day when you are drinking? 0  3. How often do you have six or more drinks on one occasion? 0  AUDIT-C Score 1     Goals, Interventions and Follow-up Plan Goals:  Increase self care Interventions: Behavioral Activation Follow-up Plan:  Bi-weekly visits    Makayla Jackson

## 2023-09-17 ENCOUNTER — Ambulatory Visit: Payer: 59 | Admitting: Professional Counselor

## 2023-09-26 ENCOUNTER — Ambulatory Visit: Payer: Self-pay | Admitting: Family Medicine

## 2023-09-26 ENCOUNTER — Telehealth: Payer: 59 | Admitting: Family

## 2023-09-26 DIAGNOSIS — B9689 Other specified bacterial agents as the cause of diseases classified elsewhere: Secondary | ICD-10-CM | POA: Diagnosis not present

## 2023-09-26 DIAGNOSIS — J019 Acute sinusitis, unspecified: Secondary | ICD-10-CM | POA: Diagnosis not present

## 2023-09-26 MED ORDER — DOXYCYCLINE HYCLATE 100 MG PO TABS: 100.0000 mg | ORAL_TABLET | Freq: Two times a day (BID) | ORAL | 0 refills | Status: DC

## 2023-09-26 NOTE — Telephone Encounter (Signed)
 Copied from CRM [id number]. Topic: Clinical - Red Word Triage >> Sep 26, 2023  8:06 AM Clayton Bibles wrote: Red Word that prompted transfer to Nurse Triage: She is blowing out bleed and green stuff; headache, face pain, she tested positive for Flu A 2 weeks ago   Chief Complaint: sinus pain Symptoms: green nasal discharge Frequency: ongoing 2 weeks  Disposition: [] ED /[] Urgent Care (no appt availability in office) / [x] Appointment(In office/virtual)/ []  [facility]/ [] Home Care/ [] Refused Recommended Disposition /[] [facility]/ []  Follow-up with PCP Additional Notes:  The patient reported that she had the Flu A slightly over two weeks ago.  Her nasal discharge was clear but now it is green and she is having facial pain.  She has an ongoing mild headache and her cheeks and above her eyes are tender to touch.  She has been doing nasal washes and taking over the counter medicine that has been somewhat helpful but the pain persists.  She also reported that her ears feel "tight."  She was scheduled for a same day virtual visit for further evaluation.   Reason for Disposition  Earache  Answer Assessment - Initial Assessment Questions 1. LOCATION: "Where does it hurt?"      Across cheek bones and some above eyes  2. ONSET: "When did the sinus pain start?"  (e.g., hours, days)      A little over two weeks 3. SEVERITY: "How bad is the pain?"   (Scale 1-10; mild, moderate or severe)   - MILD (1-3): doesn't interfere with normal activities    - MODERATE (4-7): interferes with normal activities (e.g., work or school) or awakens from sleep   - SEVERE (8-10): excruciating pain and patient unable to do any normal activities        Tender to the touch  4. RECURRENT SYMPTOM: "Have you ever had sinus problems before?" If Yes, ask: "When was the last time?" and "What happened that time?"      Yes, multiple Sinus infection   6. NASAL DISCHARGE: "Do you have discharge from your nose?" If so  ask, "What color?"     Green  7. FEVER: "Do you have a fever?" If Yes, ask: "What is it, how was it measured, and when did it start?"      99.1 8. OTHER SYMPTOMS: "Do you have any other symptoms?" (e.g., sore throat, cough, earache, difficulty breathing)     Ears feel tight, mild nose bleeds, persistent headache  Protocols used: Sinus Pain or Congestion-A-AH

## 2023-09-26 NOTE — Progress Notes (Signed)
 Virtual Visit Consent   Makayla Jackson, you are scheduled for a virtual visit with a [facility] provider today. Just as with appointments in the office, your consent must be obtained to participate. Your consent will be active for this visit and any virtual visit you may have with one of our providers in the next 365 days. If you have a MyChart account, a copy of this consent can be sent to you electronically.  As this is a virtual visit, video technology does not allow for your provider to perform a traditional examination. This may limit your provider's ability to fully assess your condition. If your provider identifies any concerns that need to be evaluated in person or the need to arrange testing (such as labs, EKG, etc.), we will make arrangements to do so. Although advances in technology are sophisticated, we cannot ensure that it will always work on either your end or our end. If the connection with a video visit is poor, the visit may have to be switched to a telephone visit. With either a video or telephone visit, we are not always able to ensure that we have a secure connection.  By engaging in this virtual visit, you consent to the provision of healthcare and authorize for your insurance to be billed (if applicable) for the services provided during this visit. Depending on your insurance coverage, you may receive a charge related to this service.  I need to obtain your verbal consent now. Are you willing to proceed with your visit today? Makayla Jackson has provided verbal consent on 09/26/2023 for a virtual visit (video or telephone). Jannifer Rodney, FNP  Date: 09/26/2023 11:57 AM   Virtual Visit via Video Note   I, Jannifer Rodney, connected with  Makayla Jackson  ([id number], 31-May-1966) on 09/26/23 at 11:55 AM EST by a video-enabled telemedicine application and verified that I am speaking with the correct person using two identifiers.  Location: Patient: Virtual Visit Location Patient:  Home Provider: Virtual Visit Location Provider: Home Office   I discussed the limitations of evaluation and management by telemedicine and the availability of in person appointments. The patient expressed understanding and agreed to proceed.    History of Present Illness: Makayla Jackson is a 58 y.o. who identifies as a female who was assigned female at birth, and is being seen today for sinus congestion. Reports she had flu two weeks ago.   HPI: Sinusitis This is a new problem. The current episode started 1 to 4 weeks ago. The problem has been gradually worsening since onset. Maximum temperature: 99.1 F. Her pain is at a severity of 7/10. The pain is mild. Associated symptoms include congestion, coughing, ear pain, headaches, sinus pressure, sneezing and a sore throat (from mucus). Pertinent negatives include no chills. Past treatments include oral decongestants and nasal decongestants. The treatment provided mild relief.    Problems:  Patient Active Problem List   Diagnosis Date Noted   Stress due to illness of family member 08/14/2022   Conjunctivitis 08/14/2021   Abdominal pain, epigastric 10/23/2019   Hyperthyroidism 05/14/2019   Arthralgia 05/14/2019   Irritable bowel syndrome with both constipation and diarrhea 06/18/2018   Hyperparathyroidism (HCC) 10/01/2017   History of small bowel obstruction 10/18/2016   Anxiety 07/22/2015   Papule of skin 02/08/2015   Migraine with aura and without status migrainosus, not intractable 12/21/2013   Constipation 08/20/2012   Hemorrhagic ovarian cyst 08/20/2012   Multinodular goiter 05/25/2012    Allergies:  Allergies  Allergen Reactions   Other Anaphylaxis and Hives    Bee stings   Shellfish Allergy Swelling   Penicillins Hives   Sulfa Antibiotics Hives   Prednisone     Pt stated, "It made me feel slightly agitated. I could not sleep well"   Medications:  Current Outpatient Medications:    doxycycline (VIBRA-TABS) 100 MG tablet, Take  1 tablet (100 mg total) by mouth 2 (two) times daily., Disp: 20 tablet, Rfl: 0   acetaminophen (TYLENOL) 325 MG tablet, Take 650 mg by mouth every 6 (six) hours., Disp: , Rfl:    albuterol (VENTOLIN HFA) 108 (90 Base) MCG/ACT inhaler, Inhale 2 puffs into the lungs every 6 (six) hours as needed for wheezing or shortness of breath., Disp: 8 g, Rfl: 0   cetirizine (ZYRTEC) 10 MG tablet, Take 10 mg by mouth daily., Disp: , Rfl:    fluticasone-salmeterol (ADVAIR) 100-50 MCG/ACT AEPB, Inhale 1 puff into the lungs 2 (two) times daily., Disp: 180 each, Rfl: 3   ondansetron (ZOFRAN-ODT) 4 MG disintegrating tablet, Take 1 tablet (4 mg total) by mouth every 8 (eight) hours as needed for nausea or vomiting., Disp: 20 tablet, Rfl: 0   SUMAtriptan (IMITREX) 50 MG tablet, Take 1 tablet (50 mg total) by mouth daily as needed for Migraine (may repeat in 2 hours)., Disp: 10 tablet, Rfl: 3   valACYclovir (VALTREX) 1000 MG tablet, Take 2 tablets (2,000 mg total) by mouth 2 (two) times daily. x1 day per flare. Repeat as needed for each fever blister flare., Disp: 20 tablet, Rfl: 0  Observations/Objective: Patient is well-developed, well-nourished in no acute distress.  Resting comfortably  at home.  Head is normocephalic, atraumatic.  No labored breathing.  Speech is clear and coherent with logical content.  Patient is alert and oriented at baseline.  Nasal congestion  Assessment and Plan: 1. Acute bacterial sinusitis (Primary) - doxycycline (VIBRA-TABS) 100 MG tablet; Take 1 tablet (100 mg total) by mouth 2 (two) times daily.  Dispense: 20 tablet; Refill: 0  - Take meds as prescribed - Use a cool mist humidifier  -Use saline nose sprays frequently -Force fluids -For any cough or congestion  Use plain Mucinex- regular strength or max strength is fine -For fever or aces or pains- take tylenol or ibuprofen. -Throat lozenges if help -Follow up if symptoms worsen or  do not improve   Follow Up  Instructions: I discussed the assessment and treatment plan with the patient. The patient was provided an opportunity to ask questions and all were answered. The patient agreed with the plan and demonstrated an understanding of the instructions.  A copy of instructions were sent to the patient via MyChart unless otherwise noted below.    The patient was advised to call back or seek an in-person evaluation if the symptoms worsen or if the condition fails to improve as anticipated.    Jannifer Rodney, FNP

## 2023-10-08 ENCOUNTER — Ambulatory Visit (INDEPENDENT_AMBULATORY_CARE_PROVIDER_SITE_OTHER): Payer: 59 | Admitting: Professional Counselor

## 2023-10-08 DIAGNOSIS — Z630 Problems in relationship with spouse or partner: Secondary | ICD-10-CM | POA: Diagnosis not present

## 2023-10-11 NOTE — BH Specialist Note (Signed)
 [facility] Telephone Follow-up  MRN: [id number] NAME: Makayla Jackson Date: 10/11/23  Start time: Start Time: 0330 End time: Stop Time: 0405 Total time: Total Time in Minutes (Visit): 35 Call number: Visit Number: 6-Sixth Visit  Reason for call today:  The patient is a 58 year old female who presented for her sixth collaborative care follow-up visit. During the session, her progress on treatment goals was assessed, and next steps were discussed. The patient reported significant benefits from collaborative care, including a reduction in depressive and anxiety symptoms, as well as improved coping skills and insight into her situation. She expressed feeling that her circumstances had improved enough to consider ending services. The behavioral counselor agreed that she had met her collaborative care goals, and given the nature of the program, it was determined that transitioning to a traditional therapist would be appropriate for continued support. The patient was provided with a therapist's contact information for follow-up, and at this time, we will sign off on her from the collaborative care program.    PHQ-9 Scores:     08/13/2023    3:51 PM 08/05/2023    2:11 PM 06/18/2023    3:47 PM 05/28/2023    3:52 PM 05/10/2023    9:32 AM  Depression screen PHQ 2/9  Decreased Interest 1 1 1  0 0  Down, Depressed, Hopeless 0 1 1 0 1  PHQ - 2 Score 1 2 2  0 1  Altered sleeping 1 1 1 1 1   Tired, decreased energy 1 0 1 3 1   Change in appetite 0 0 0 0 0  Feeling bad or failure about yourself  0 0 0 0 0  Trouble concentrating 0 0 0 0 1  Moving slowly or fidgety/restless 0 0 0 0 0  Suicidal thoughts 0 0 0 0 0  PHQ-9 Score 3 3 4 4 4   Difficult doing work/chores Somewhat difficult Not difficult at all Somewhat difficult Somewhat difficult Somewhat difficult   GAD-7 Scores:     08/13/2023    3:52 PM 08/05/2023    2:11 PM 06/18/2023    3:47 PM 05/28/2023    3:54 PM  GAD 7 : Generalized Anxiety  Score  Nervous, Anxious, on Edge 1 1 1 2   Control/stop worrying 0 1 1 1   Worry too much - different things 0 1 1 1   Trouble relaxing 1 1 1  0  Restless 0 0 0 0  Easily annoyed or irritable 0 0 1 2  Afraid - awful might happen 0 0 0 0  Total GAD 7 Score 2 4 5 6   Anxiety Difficulty Somewhat difficult  Somewhat difficult Somewhat difficult    Stress Current stressors:  Work Sleep:  Good Appetite:  Good Coping ability:  Good Patient taking medications as prescribed:  Yes  Current medications:  Outpatient Encounter Medications as of 10/08/2023  Medication Sig   acetaminophen (TYLENOL) 325 MG tablet Take 650 mg by mouth every 6 (six) hours.   albuterol (VENTOLIN HFA) 108 (90 Base) MCG/ACT inhaler Inhale 2 puffs into the lungs every 6 (six) hours as needed for wheezing or shortness of breath.   cetirizine (ZYRTEC) 10 MG tablet Take 10 mg by mouth daily.   doxycycline (VIBRA-TABS) 100 MG tablet Take 1 tablet (100 mg total) by mouth 2 (two) times daily.   fluticasone-salmeterol (ADVAIR) 100-50 MCG/ACT AEPB Inhale 1 puff into the lungs 2 (two) times daily.   ondansetron (ZOFRAN-ODT) 4 MG disintegrating tablet Take 1 tablet (4 mg total) by  mouth every 8 (eight) hours as needed for nausea or vomiting.   SUMAtriptan (IMITREX) 50 MG tablet Take 1 tablet (50 mg total) by mouth daily as needed for Migraine (may repeat in 2 hours).   valACYclovir (VALTREX) 1000 MG tablet Take 2 tablets (2,000 mg total) by mouth 2 (two) times daily. x1 day per flare. Repeat as needed for each fever blister flare.   No facility-administered encounter medications on file as of 10/08/2023.     Self-harm Behaviors Risk Assessment Self-harm risk factors:  None Patient endorses recent thoughts of harming self:  Denies   Danger to Others Risk Assessment Danger to others risk factors:  None Patient endorses recent thoughts of harming others:  Denies   Substance Use Assessment Patient recently consumed alcohol:   Yes  Alcohol Use Disorder Identification Test (AUDIT):     04/29/2023    3:42 PM 09/26/2023    9:02 AM  Alcohol Use Disorder Test (AUDIT)  1. How often do you have a drink containing alcohol? 1 2  2. How many drinks containing alcohol do you have on a typical day when you are drinking? 0 0  3. How often do you have six or more drinks on one occasion? 0 0  AUDIT-C Score 1 2      Patient-reported      Goals, Interventions and Follow-up Plan Goals:  Satisfied Interventions: CBT Cognitive Behavioral Therapy Follow-up Plan: Refer to [facility] Therapy    Reuel Boom

## 2023-10-11 NOTE — Patient Instructions (Signed)
 Talmadge Coventry, Andres Shad,?[facility], [facility]   Address: [address] [place], [place] [address]   Phone: [phone]   Services: Individual/couples therapy, mental wellness and behavioral coaching    Website: [employer] in [place], [place] -  [email or web address]

## 2023-10-17 ENCOUNTER — Ambulatory Visit: Admitting: Nurse Practitioner

## 2023-10-17 ENCOUNTER — Encounter: Payer: Self-pay | Admitting: Nurse Practitioner

## 2023-10-17 VITALS — BP 138/96 | HR 89 | Temp 97.9°F | Ht 64.0 in | Wt 159.0 lb

## 2023-10-17 DIAGNOSIS — K5732 Diverticulitis of large intestine without perforation or abscess without bleeding: Secondary | ICD-10-CM | POA: Diagnosis not present

## 2023-10-17 MED ORDER — CIPROFLOXACIN HCL 500 MG PO TABS
500.0000 mg | ORAL_TABLET | Freq: Two times a day (BID) | ORAL | 0 refills | Status: DC
Start: 2023-10-17 — End: 2023-12-24

## 2023-10-17 MED ORDER — METRONIDAZOLE 500 MG PO TABS: 500.0000 mg | ORAL_TABLET | Freq: Three times a day (TID) | ORAL | 0 refills | Status: AC

## 2023-10-17 NOTE — Patient Instructions (Signed)
 Diverticulitis  Diverticulitis is when small pouches in your colon get infected or swollen. This causes pain in your belly (abdomen) and watery poop (diarrhea). The small pouches are called diverticula. They may form if you have a condition called diverticulosis. What are the causes? You may get this condition if poop (stool) gets trapped in the pouches in your colon. The poop lets germs (bacteria) grow. This causes an infection. What increases the risk? You are more likely to get this condition if you have small pouches in your colon. You are also more likely to get it if: You are overweight or very overweight (obese). You do not exercise enough. You drink alcohol. You smoke. You eat a lot of red meat, like beef, pork, or lamb. You do not eat enough fiber. You are older than 57 years of age. What are the signs or symptoms? Pain in your belly. Pain is often on the left side, but it may be felt in other spots too. Fever and chills. Feeling like you may vomit. Vomiting. Having cramps. Feeling full. Changes in how often you poop. Blood in your poop. How is this treated? Most cases are treated at home. You may be told to: Take over-the-counter pain medicines. Only eat and drink clear liquids. Take antibiotics. Rest. Very bad cases may need to be treated at a hospital. Treatment may include: Not eating or drinking. Taking pain medicines. Getting antibiotics through an IV tube. Getting fluid and food through an IV tube. Having surgery. When you are feeling better, you may need to have a test to look at your colon (colonoscopy). Follow these instructions at home: Medicines Take over-the-counter and prescription medicines only as told by your doctor. These include: Fiber pills. Probiotics. Medicines to make your poop soft (stool softeners). If you were prescribed antibiotics, take them as told by your doctor. Do not stop taking them even if you start to feel better. Ask your  doctor if you should avoid driving or using machines while you are taking your medicine. Eating and drinking  Follow the diet told by your doctor. You may need to only eat and drink liquids. When you feel better, you may be able to eat more foods. You may also be told to eat a lot of fiber. Fiber helps you poop. Foods with fiber include berries, beans, lentils, and green vegetables. Try not to eat red meat. General instructions Do not smoke or use any products that contain nicotine or tobacco. If you need help quitting, ask your doctor. Exercise 3 or more times a week. Try to go for 30 minutes each time. Exercise enough to sweat and make your heart beat faster. Contact a doctor if: Your pain gets worse. You are not pooping like normal. Your symptoms do not get better. Your symptoms get worse very fast. You have a fever. You vomit more than one time. You have poop that is: Bloody. Black. Tarry. This information is not intended to replace advice given to you by your health care provider. Make sure you discuss any questions you have with your health care provider. Document Revised: 04/19/2022 Document Reviewed: 04/19/2022 Elsevier Patient Education  2024 [employer].

## 2023-10-17 NOTE — Progress Notes (Signed)
 Subjective:    Patient ID: RUSTI ARIZMENDI, female    DOB: Dec 18, 1965, 58 y.o.   MRN: [id number]   Chief Complaint: Left lower quadrant pain   Abdominal Pain Pertinent negatives include no constipation, diarrhea, nausea or vomiting.   Patient in c/o flare up of diverticulitis. She has reoccurrence of left  lower quadrant pain that is the same as the pain she always gets with diverticulitis. Started severely on Tuesday. Rates pain 8-10. Had colon resection years ago from diverticuitis. Passing gas and having bowel movements Patient Active Problem List   Diagnosis Date Noted   Stress due to illness of family member 08/14/2022   Conjunctivitis 08/14/2021   Abdominal pain, epigastric 10/23/2019   Hyperthyroidism 05/14/2019   Arthralgia 05/14/2019   Irritable bowel syndrome with both constipation and diarrhea 06/18/2018   Hyperparathyroidism (HCC) 10/01/2017   History of small bowel obstruction 10/18/2016   Anxiety 07/22/2015   Papule of skin 02/08/2015   Migraine with aura and without status migrainosus, not intractable 12/21/2013   Constipation 08/20/2012   Hemorrhagic ovarian cyst 08/20/2012   Multinodular goiter 05/25/2012         Review of Systems  Gastrointestinal:  Positive for abdominal pain. Negative for constipation, diarrhea, nausea and vomiting.       Objective:   Physical Exam Constitutional:      Appearance: Normal appearance.  Cardiovascular:     Rate and Rhythm: Normal rate and regular rhythm.     Heart sounds: Normal heart sounds.  Pulmonary:     Effort: Pulmonary effort is normal.     Breath sounds: Normal breath sounds.  Skin:    General: Skin is warm.  Neurological:     General: No focal deficit present.     Mental Status: She is alert and oriented to person, place, and time.  Psychiatric:        Mood and Affect: Mood normal.        Behavior: Behavior normal.    BP (!) 138/96   Pulse 89   Temp 97.9 F (36.6 C) (Temporal)   Ht 5\' 4"   (1.626 m)   Wt 159 lb (72.1 kg)   SpO2 98%   BMI 27.29 kg/m         Assessment & Plan:   Marni Griffon in today with chief complaint of Abdominal Pain (Thinks she has diverticulitis/)   1. Diverticulitis of large intestine without perforation or abscess without bleeding (Primary) Watch diet Avoid small seeds and non digestable skin Force fluids RTO prn  Meds ordered this encounter  Medications   ciprofloxacin (CIPRO) 500 MG tablet    Sig: Take 1 tablet (500 mg total) by mouth 2 (two) times daily.    Dispense:  10 tablet    Refill:  0    Supervising Provider:   Arville Care A [[id number]]   metroNIDAZOLE (FLAGYL) 500 MG tablet    Sig: Take 1 tablet (500 mg total) by mouth 3 (three) times daily for 7 days.    Dispense:  21 tablet    Refill:  0    Supervising Provider:   Arville Care A [[id number]]       The above assessment and management plan was discussed with the patient. The patient verbalized understanding of and has agreed to the management plan. Patient is aware to call the clinic if symptoms persist or worsen. Patient is aware when to return to the clinic for a follow-up visit. Patient educated on when it is  appropriate to go to the emergency department.   Mary-Margaret Daphine Deutscher, FNP

## 2023-11-20 ENCOUNTER — Ambulatory Visit: Admitting: Podiatry

## 2023-11-20 ENCOUNTER — Ambulatory Visit (INDEPENDENT_AMBULATORY_CARE_PROVIDER_SITE_OTHER)

## 2023-11-20 DIAGNOSIS — M779 Enthesopathy, unspecified: Secondary | ICD-10-CM | POA: Diagnosis not present

## 2023-11-20 DIAGNOSIS — M7752 Other enthesopathy of left foot: Secondary | ICD-10-CM

## 2023-11-20 MED ORDER — TRIAMCINOLONE ACETONIDE 10 MG/ML IJ SUSP
10.0000 mg | Freq: Once | INTRAMUSCULAR | Status: AC
  Administered 2023-11-20: 10 mg via INTRA_ARTICULAR

## 2023-11-21 NOTE — Progress Notes (Signed)
 Subjective:   Patient ID: Makayla Jackson, female   DOB: 58 y.o.   MRN: [id number]   HPI Patient states that her foot has been hurting after twisting it left and she has had history of chronic inflammation.  Does not remember specific element to this   ROS      Objective:  Physical Exam  Neurovascular status intact muscle strength adequate range of motion within normal limits with discomfort in the dorsal tendon complex left no indication of tear or other pathological process.  Good digital perfusion     Assessment:  Tendinitis dorsal lateral aspect left foot cannot rule out bony injury or other pathology     Plan:  H&P x-ray reviewed discussed and today I did do sterile prep and injected the dorsal tendon complex after explaining risk 3 mg Dexasone Kenalog 5 mg Xylocaine advised on reduced activity ice therapy and reappoint as symptoms indicate  X-rays indicate that there is no signs of fracture or diastases injury associated with what she sustained in the last 3 weeks

## 2023-12-09 ENCOUNTER — Telehealth: Payer: Self-pay | Admitting: Family Medicine

## 2023-12-09 DIAGNOSIS — Z01419 Encounter for gynecological examination (general) (routine) without abnormal findings: Secondary | ICD-10-CM

## 2023-12-09 NOTE — Telephone Encounter (Signed)
 Future lab orders placed. Lab appt scheduled 5/27

## 2023-12-24 ENCOUNTER — Telehealth (INDEPENDENT_AMBULATORY_CARE_PROVIDER_SITE_OTHER): Admitting: Family Medicine

## 2023-12-24 ENCOUNTER — Encounter: Payer: Self-pay | Admitting: Family Medicine

## 2023-12-24 DIAGNOSIS — J019 Acute sinusitis, unspecified: Secondary | ICD-10-CM

## 2023-12-24 DIAGNOSIS — M2550 Pain in unspecified joint: Secondary | ICD-10-CM

## 2023-12-24 DIAGNOSIS — B9689 Other specified bacterial agents as the cause of diseases classified elsewhere: Secondary | ICD-10-CM

## 2023-12-24 MED ORDER — BENZONATATE 100 MG PO CAPS: 100.0000 mg | ORAL_CAPSULE | Freq: Three times a day (TID) | ORAL | 0 refills | Status: DC | PRN

## 2023-12-24 MED ORDER — CEFDINIR 300 MG PO CAPS: 300.0000 mg | ORAL_CAPSULE | Freq: Two times a day (BID) | ORAL | 0 refills | Status: DC

## 2023-12-24 NOTE — Progress Notes (Signed)
MyChart Video visit  Subjective: EA:VWUJW infection PCP: Eliodoro Guerin, DO JXB:JYNWG Makayla Jackson is a 58 y.o. female. Patient provides verbal consent for consult held via video.  Due to COVID-19 pandemic this visit was conducted virtually. This visit type was conducted due to national recommendations for restrictions regarding the COVID-19 Pandemic (e.g. social distancing, sheltering in place) in an effort to limit this patient's exposure and mitigate transmission in our community. All issues noted in this document were discussed and addressed.  A physical exam was not performed with this format.   Location of patient: home Location of provider: [facility] Others present for call: none  1. Sinusitis Started as allergies but then progressed to ear pain, sinus headache, facial pain.  Mucus was clear and now yellow and thick.  Subjective fevers.  TMax 99.105F.  She is using zyrtec, saline, and flonase .  2. Polyarthralgia Continues to having worsening polyarthralgia and wants labs repeated.   ROS: Per HPI  Allergies  Allergen Reactions   Other Anaphylaxis and Hives    Bee stings   Shellfish Allergy Swelling   Penicillins Hives   Sulfa Antibiotics Hives   Prednisone      Pt stated, "It made me feel slightly agitated. I could not sleep well"   Past Medical History:  Diagnosis Date   Arthritis    Asthma    Bowel obstruction (HCC) 08/2016   Diverticulitis    Miscarriage    Multiple thyroid  nodules    Osteopenia    Parathyroid tumor    Pneumonia    SBO (small bowel obstruction) (HCC)    Thyroid  disease     Current Outpatient Medications:    acetaminophen (TYLENOL) 325 MG tablet, Take 650 mg by mouth every 6 (six) hours., Disp: , Rfl:    albuterol  (VENTOLIN  HFA) 108 (90 Base) MCG/ACT inhaler, Inhale 2 puffs into the lungs every 6 (six) hours as needed for wheezing or shortness of breath., Disp: 8 g, Rfl: 0   cetirizine (ZYRTEC) 10 MG tablet, Take 10 mg by mouth daily., Disp: , Rfl:     ciprofloxacin  (CIPRO ) 500 MG tablet, Take 1 tablet (500 mg total) by mouth 2 (two) times daily. (Patient not taking: Reported on 11/20/2023), Disp: 10 tablet, Rfl: 0   fluticasone -salmeterol (ADVAIR) 100-50 MCG/ACT AEPB, Inhale 1 puff into the lungs 2 (two) times daily., Disp: 180 each, Rfl: 3   ondansetron  (ZOFRAN -ODT) 4 MG disintegrating tablet, Take 1 tablet (4 mg total) by mouth every 8 (eight) hours as needed for nausea or vomiting., Disp: 20 tablet, Rfl: 0   SUMAtriptan  (IMITREX ) 50 MG tablet, Take 1 tablet (50 mg total) by mouth daily as needed for Migraine (may repeat in 2 hours)., Disp: 10 tablet, Rfl: 3   valACYclovir  (VALTREX ) 1000 MG tablet, Take 2 tablets (2,000 mg total) by mouth 2 (two) times daily. x1 day per flare. Repeat as needed for each fever blister flare., Disp: 20 tablet, Rfl: 0  Gen: nontoxic appearing female, NAD HEENT: sclera white, MMM, no conjunctival injection or facial swelling.  Assessment/ Plan: 58 y.o. female   Acute bacterial sinusitis - Plan: cefdinir  (OMNICEF ) 300 MG capsule, benzonatate  (TESSALON  PERLES) 100 MG capsule  Polyarthralgia - Plan: C-reactive protein, Sedimentation rate, [employer]  Meds sent. Home care instructions reviewed.  Follow up as needed.  Future orders for autoimmune/ inflammation.  Start time: 5:00pm End time: 5:12pm  Total time spent on patient care (including video visit/ documentation): 13 minutes  Makayla Shanker Bambi Bonine, DO [facility] [phone]  [phone]   

## 2023-12-25 ENCOUNTER — Ambulatory Visit: Admitting: Family Medicine

## 2023-12-31 ENCOUNTER — Other Ambulatory Visit

## 2023-12-31 ENCOUNTER — Encounter (INDEPENDENT_AMBULATORY_CARE_PROVIDER_SITE_OTHER): Payer: Self-pay | Admitting: Family Medicine

## 2023-12-31 DIAGNOSIS — W57XXXA Bitten or stung by nonvenomous insect and other nonvenomous arthropods, initial encounter: Secondary | ICD-10-CM

## 2023-12-31 DIAGNOSIS — Z01419 Encounter for gynecological examination (general) (routine) without abnormal findings: Secondary | ICD-10-CM

## 2023-12-31 DIAGNOSIS — R002 Palpitations: Secondary | ICD-10-CM

## 2023-12-31 DIAGNOSIS — M2550 Pain in unspecified joint: Secondary | ICD-10-CM

## 2023-12-31 DIAGNOSIS — S70362A Insect bite (nonvenomous), left thigh, initial encounter: Secondary | ICD-10-CM

## 2023-12-31 LAB — LIPID PANEL

## 2024-01-01 ENCOUNTER — Ambulatory Visit: Payer: Self-pay | Admitting: Family Medicine

## 2024-01-02 ENCOUNTER — Other Ambulatory Visit: Payer: BC Managed Care – PPO

## 2024-01-03 MED ORDER — DOXYCYCLINE HYCLATE 100 MG PO TABS
100.0000 mg | ORAL_TABLET | Freq: Two times a day (BID) | ORAL | 0 refills | Status: AC
Start: 2024-01-03 — End: 2024-01-17

## 2024-01-03 NOTE — Telephone Encounter (Signed)

## 2024-01-06 ENCOUNTER — Encounter: Payer: Self-pay | Admitting: Family Medicine

## 2024-01-06 ENCOUNTER — Ambulatory Visit (INDEPENDENT_AMBULATORY_CARE_PROVIDER_SITE_OTHER): Payer: BC Managed Care – PPO | Admitting: Family Medicine

## 2024-01-06 VITALS — BP 119/78 | HR 75 | Temp 98.6°F | Ht 64.0 in | Wt 163.0 lb

## 2024-01-06 DIAGNOSIS — G43809 Other migraine, not intractable, without status migrainosus: Secondary | ICD-10-CM

## 2024-01-06 DIAGNOSIS — E7800 Pure hypercholesterolemia, unspecified: Secondary | ICD-10-CM

## 2024-01-06 DIAGNOSIS — Z1211 Encounter for screening for malignant neoplasm of colon: Secondary | ICD-10-CM | POA: Diagnosis not present

## 2024-01-06 DIAGNOSIS — F419 Anxiety disorder, unspecified: Secondary | ICD-10-CM

## 2024-01-06 DIAGNOSIS — R7989 Other specified abnormal findings of blood chemistry: Secondary | ICD-10-CM | POA: Diagnosis not present

## 2024-01-06 DIAGNOSIS — Z79899 Other long term (current) drug therapy: Secondary | ICD-10-CM

## 2024-01-06 DIAGNOSIS — Z0001 Encounter for general adult medical examination with abnormal findings: Secondary | ICD-10-CM

## 2024-01-06 DIAGNOSIS — Z0000 Encounter for general adult medical examination without abnormal findings: Secondary | ICD-10-CM

## 2024-01-06 DIAGNOSIS — B001 Herpesviral vesicular dermatitis: Secondary | ICD-10-CM

## 2024-01-06 DIAGNOSIS — J4520 Mild intermittent asthma, uncomplicated: Secondary | ICD-10-CM

## 2024-01-06 DIAGNOSIS — Z23 Encounter for immunization: Secondary | ICD-10-CM

## 2024-01-06 MED ORDER — VALACYCLOVIR HCL 1 G PO TABS: 2000.0000 mg | ORAL_TABLET | Freq: Two times a day (BID) | ORAL | 0 refills | Status: AC

## 2024-01-06 MED ORDER — FLUTICASONE-SALMETEROL 100-50 MCG/ACT IN AEPB: 1.0000 | INHALATION_SPRAY | Freq: Two times a day (BID) | RESPIRATORY_TRACT | 3 refills | Status: AC

## 2024-01-06 MED ORDER — ALBUTEROL SULFATE HFA 108 (90 BASE) MCG/ACT IN AERS: 2.0000 | INHALATION_SPRAY | Freq: Four times a day (QID) | RESPIRATORY_TRACT | 0 refills | Status: AC | PRN

## 2024-01-06 MED ORDER — ALPRAZOLAM 0.25 MG PO TABS: ORAL_TABLET | ORAL | 1 refills | Status: DC

## 2024-01-06 MED ORDER — SUMATRIPTAN SUCCINATE 50 MG PO TABS: ORAL_TABLET | ORAL | 3 refills | Status: AC

## 2024-01-06 NOTE — Progress Notes (Signed)
 Makayla Jackson is a 58 y.o. female presents to office today for annual physical exam examination.    Concerns today include: 1. Elevated TSH Had labs done. TSH elevated.  Reports decreased energy/ difficulty losing weight. Reports increased stress at school but marital stress resolved and things are going well. Needs alprazolam  renewed for PRN use.  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 [employer], Marital status: married, Substance use: none Health Maintenance Due  Topic Date Due   Pneumococcal Vaccine 26-27 Years old (1 of 2 - PCV) Never done   Refills needed today: all  Immunization History  Administered Date(s) Administered   Hepatitis B 05/14/1995, 06/18/1995, 03/11/1996   Influenza, Seasonal, Injecte, Preservative Fre 08/20/2012, 07/20/2015, 08/17/2016   Influenza,inj,Quad PF,6+ Mos 07/20/2015, 08/17/2016, 05/07/2018, 05/14/2019, 08/11/2020, 04/24/2022   Influenza,inj,Quad PF,6-35 Mos 07/20/2015, 08/17/2016   Influenza,trivalent, recombinat, inj, PF 08/20/2012   Moderna Sars-Covid-2 Vaccination 12/10/2019, 01/12/2020   PPD Test 03/07/2018   Tdap 02/09/2020   Past Medical History:  Diagnosis Date   Arthritis    Asthma    Bowel obstruction (HCC) 08/2016   Diverticulitis    Miscarriage    Multiple thyroid  nodules    Osteopenia    Parathyroid tumor    Pneumonia    SBO (small bowel obstruction) (HCC)    Thyroid  disease    Social History   Socioeconomic History   Marital status: Married    Spouse name: Not on file   Number of children: 3   Years of education: Not on file   Highest education level: Bachelor's degree (e.g., BA, AB, BS)  Occupational History   Occupation: Magazine features editor: [employer]  Tobacco Use   Smoking status: Never   Smokeless tobacco: Never  Vaping Use   Vaping status: Never Used  Substance and Sexual Activity   Alcohol use: Yes    Alcohol/week: 1.0 standard drink of alcohol    Types: 1 Glasses of wine per  week    Comment: occasional glass of wine    Drug use: No   Sexual activity: Not on file  Other Topics Concern   Not on file  Social History Narrative   The patient is married.  Her husband is retiring as Engineer, building services [employer]  after serving in [place].   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[facility]   Drinks 2 glasses of wine daily, no tobacco no drug use   Social Drivers of Corporate investment banker Strain: Patient Declined (09/26/2023)   Overall Financial Resource Strain (CARDIA)    Difficulty of Paying Living Expenses: Patient declined  Food Insecurity: Unknown (09/26/2023)   Hunger Vital Sign    Worried About Running Out of Food in the Last Year: Patient declined    Ran Out of Food in the Last Year: Never true  Transportation Needs: No Transportation Needs (09/26/2023)   PRAPARE - Administrator, Civil Service (Medical): No    Lack of Transportation (Non-Medical): No  Physical Activity: Insufficiently Active (09/26/2023)   Exercise Vital Sign    Days of Exercise per Week: 3 days    Minutes of Exercise per Session: 30 min  Stress: Patient Declined (09/26/2023)   [employer] of Occupational Health - Occupational Stress Questionnaire    Feeling of Stress : Patient declined  Social Connections: Socially Integrated (09/26/2023)   Social Connection and Isolation Panel [NHANES]    Frequency of Communication with Friends and  Family: More than three times a week    Frequency of Social Gatherings with Friends and Family: Twice a week    Attends Religious Services: More than 4 times per year    Active Member of [employer] or Organizations: Yes    Attends Engineer, structural: More than 4 times per year    Marital Status: Married  Catering manager Violence: Not on file   Past Surgical History:  Procedure Laterality Date   ABDOMINAL HYSTERECTOMY     COLON SURGERY  2014   Diverticulosis resection, after diverticulitis   FINE  NEEDLE ASPIRATION     Thyroid  Nodule   KNEE ARTHROSCOPY Right    NASAL SINUS SURGERY  1998   PARATHYROIDECTOMY  12/2014   PARTIAL HYSTERECTOMY  2010   TONSILLECTOMY     Family History  Problem Relation Age of Onset   Arthritis Mother    Arthritis/Rheumatoid Mother    Asthma Mother    Heart disease Mother    Hypertension Mother    Stroke Mother    Vision loss Mother    Thyroid  disease Mother    Colon polyps Mother    Thyroid  cancer Mother    Arthritis Father    Heart disease Father    Hypertension Father    Colon polyps Father    Diverticulitis Father        with colon surgery   Arthritis/Rheumatoid Maternal Aunt    Arthritis/Rheumatoid Maternal Uncle    Diabetes Maternal Grandmother    Lung cancer Maternal Grandfather    Cancer Paternal Grandfather        type unknown   Colon cancer Cousin    Thyroid  cancer Cousin    Breast cancer Cousin     Current Outpatient Medications:    acetaminophen (TYLENOL) 325 MG tablet, Take 650 mg by mouth every 6 (six) hours., Disp: , Rfl:    albuterol  (VENTOLIN  HFA) 108 (90 Base) MCG/ACT inhaler, Inhale 2 puffs into the lungs every 6 (six) hours as needed for wheezing or shortness of breath., Disp: 8 g, Rfl: 0   benzonatate  (TESSALON  PERLES) 100 MG capsule, Take 1 capsule (100 mg total) by mouth 3 (three) times daily as needed for cough., Disp: 20 capsule, Rfl: 0   cefdinir  (OMNICEF ) 300 MG capsule, Take 1 capsule (300 mg total) by mouth 2 (two) times daily. 1 po BID, Disp: 20 capsule, Rfl: 0   cetirizine (ZYRTEC) 10 MG tablet, Take 10 mg by mouth daily., Disp: , Rfl:    doxycycline  (VIBRA -TABS) 100 MG tablet, Take 1 tablet (100 mg total) by mouth 2 (two) times daily for 14 days., Disp: 28 tablet, Rfl: 0   fluticasone -salmeterol (ADVAIR) 100-50 MCG/ACT AEPB, Inhale 1 puff into the lungs 2 (two) times daily., Disp: 180 each, Rfl: 3   ondansetron  (ZOFRAN -ODT) 4 MG disintegrating tablet, Take 1 tablet (4 mg total) by mouth every 8 (eight) hours  as needed for nausea or vomiting., Disp: 20 tablet, Rfl: 0   SUMAtriptan  (IMITREX ) 50 MG tablet, Take 1 tablet (50 mg total) by mouth daily as needed for Migraine (may repeat in 2 hours)., Disp: 10 tablet, Rfl: 3   valACYclovir  (VALTREX ) 1000 MG tablet, Take 2 tablets (2,000 mg total) by mouth 2 (two) times daily. x1 day per flare. Repeat as needed for each fever blister flare., Disp: 20 tablet, Rfl: 0  Allergies  Allergen Reactions   Other Anaphylaxis and Hives    Bee stings   Shellfish Allergy Swelling   Penicillins  Hives   Sulfa Antibiotics Hives   Prednisone      Pt stated, "It made me feel slightly agitated. I could not sleep well"     ROS: Review of Systems Pertinent items noted in HPI and remainder of comprehensive ROS otherwise negative.    Physical exam BP 119/78   Pulse 75   Temp 98.6 F (37 C)   Ht 5\' 4"  (1.626 m)   Wt 163 lb (73.9 kg)   SpO2 94%   BMI 27.98 kg/m  General appearance: alert, cooperative, appears stated age, and no distress Head: Normocephalic, without obvious abnormality, atraumatic Eyes: negative findings: lids and lashes normal, conjunctivae and sclerae normal, corneas clear, and pupils equal, round, reactive to light and accomodation Ears: normal TM's and external ear canals both ears Nose: Nares normal. Septum midline. Mucosa normal. No drainage or sinus tenderness. Throat: lips, mucosa, and tongue normal; teeth and gums normal Neck: no adenopathy, supple, symmetrical, trachea midline, and thyroid  not enlarged, symmetric, no tenderness/mass/nodules Back: symmetric, no curvature. ROM normal. No CVA tenderness. Lungs: clear to auscultation bilaterally Heart: regular rate and rhythm, S1, S2 normal, no murmur, click, rub or gallop Abdomen: soft, non-tender; bowel sounds normal; no masses,  no organomegaly Extremities: extremities normal, atraumatic, no cyanosis or edema Pulses: 2+ and symmetric Skin: Has several nevi along the shoulders and upper  extremities.  She has a brown pigmented nevi on the upper mons Lymph nodes: Cervical, supraclavicular, and axillary nodes normal. Neurologic: Grossly normal      01/06/2024    3:30 PM 10/17/2023    2:52 PM 08/13/2023    3:51 PM  Depression screen PHQ 2/9  Decreased Interest 1 1 1   Down, Depressed, Hopeless 1 1 0  PHQ - 2 Score 2 2 1   Altered sleeping 1 1 1   Tired, decreased energy 2 1 1   Change in appetite 1 0 0  Feeling bad or failure about yourself  0 0 0  Trouble concentrating 0 0 0  Moving slowly or fidgety/restless 0 0 0  Suicidal thoughts 0 0 0  PHQ-9 Score 6 4 3   Difficult doing work/chores Somewhat difficult Not difficult at all Somewhat difficult      01/06/2024    3:30 PM 08/13/2023    3:52 PM 08/05/2023    2:11 PM 06/18/2023    3:47 PM  GAD 7 : Generalized Anxiety Score  Nervous, Anxious, on Edge 1 1 1 1   Control/stop worrying 0 0 1 1  Worry too much - different things 1 0 1 1  Trouble relaxing 1 1 1 1   Restless 0 0 0 0  Easily annoyed or irritable 1 0 0 1  Afraid - awful might happen 0 0 0 0  Total GAD 7 Score 4 2 4 5   Anxiety Difficulty Somewhat difficult Somewhat difficult  Somewhat difficult     Assessment/ Plan: Bernadette Brewster here for annual physical exam.   Annual physical exam - Plan: Pneumococcal conjugate vaccine 20-valent (Prevnar 20)  Elevated TSH - Plan: T4, Free  Anxiety - Plan: ToxASSURE Select 13 (MW), Urine, ALPRAZolam  (XANAX ) 0.25 MG tablet  Controlled substance agreement signed - Plan: ToxASSURE Select 13 (MW), Urine  Colon cancer screening - Plan: Ambulatory referral to Gastroenterology  Pure hypercholesterolemia  Mild intermittent reactive airway disease without complication - Plan: fluticasone -salmeterol (ADVAIR) 100-50 MCG/ACT AEPB  Other migraine without status migrainosus, not intractable - Plan: SUMAtriptan  (IMITREX ) 50 MG tablet  Fever blister - Plan: valACYclovir  (VALTREX ) 1000 MG tablet  Pneumococcal vaccination  administered.  Check free T4 given elevation in TSH.  At minimum we will probably treat her for subclinical hypothyroidism with Synthroid 25 mcg and I want ensure that the free T4 is appropriate for this  Alprazolam  renewed and UDS and CSA were updated as per office policy.  The national narcotic database reviewed with no red flags  We reviewed her lab results  Albuterol  and Advair renewed for as needed use.  She is clinically recovered from URI in the tick bite has gotten a lot better as well with doxycycline   Imitrex  for as needed use.  Valtrex  for as needed use.  May follow-up in 6 months  Counseled on healthy lifestyle choices, including diet (rich in fruits, vegetables and lean meats and low in salt and simple carbohydrates) and exercise (at least 30 minutes of moderate physical activity daily).  Patient to follow up 3m for mood/ thyroid  recheck  Makayla Jackson M. Bonnell Butcher, DO

## 2024-01-07 ENCOUNTER — Ambulatory Visit: Payer: Self-pay | Admitting: Family Medicine

## 2024-01-07 DIAGNOSIS — E038 Other specified hypothyroidism: Secondary | ICD-10-CM

## 2024-01-07 LAB — T4, FREE: Free T4: 0.87 ng/dL (ref 0.82–1.77)

## 2024-01-07 MED ORDER — LEVOTHYROXINE SODIUM 25 MCG PO TABS: 25.0000 ug | ORAL_TABLET | Freq: Every day | ORAL | 3 refills | Status: DC

## 2024-01-08 LAB — TOXASSURE SELECT 13 (MW), URINE

## 2024-01-09 LAB — CBC WITH DIFFERENTIAL/PLATELET
Basophils Absolute: 0.1 10*3/uL (ref 0.0–0.2)
Basos: 1 %
EOS (ABSOLUTE): 0.1 10*3/uL (ref 0.0–0.4)
Eos: 2 %
Hematocrit: 44.8 % (ref 34.0–46.6)
Hemoglobin: 14.4 g/dL (ref 11.1–15.9)
Immature Grans (Abs): 0 10*3/uL (ref 0.0–0.1)
Immature Granulocytes: 0 %
Lymphocytes Absolute: 1.5 10*3/uL (ref 0.7–3.1)
Lymphs: 34 %
MCH: 29.5 pg (ref 26.6–33.0)
MCHC: 32.1 g/dL (ref 31.5–35.7)
MCV: 92 fL (ref 79–97)
Monocytes Absolute: 0.5 10*3/uL (ref 0.1–0.9)
Monocytes: 11 %
Neutrophils Absolute: 2.4 10*3/uL (ref 1.4–7.0)
Neutrophils: 52 %
Platelets: 296 10*3/uL (ref 150–450)
RBC: 4.88 x10E6/uL (ref 3.77–5.28)
RDW: 12.9 % (ref 11.7–15.4)
WBC: 4.6 10*3/uL (ref 3.4–10.8)

## 2024-01-09 LAB — LIPID PANEL
Chol/HDL Ratio: 1.9 ratio (ref 0.0–4.4)
Cholesterol, Total: 201 mg/dL — ABNORMAL HIGH (ref 100–199)
HDL: 105 mg/dL (ref >39)
LDL Chol Calc (NIH): 85 mg/dL (ref 0–99)
Triglycerides: 60 mg/dL (ref 0–149)
VLDL Cholesterol Cal: 11 mg/dL (ref 5–40)

## 2024-01-09 LAB — CMP14+EGFR
ALT: 17 IU/L (ref 0–32)
AST: 26 IU/L (ref 0–40)
Albumin: 4.4 g/dL (ref 3.8–4.9)
Alkaline Phosphatase: 72 IU/L (ref 44–121)
BUN/Creatinine Ratio: 13 (ref 9–23)
BUN: 10 mg/dL (ref 6–24)
Bilirubin Total: 0.4 mg/dL (ref 0.0–1.2)
CO2: 21 mmol/L (ref 20–29)
Calcium: 9.5 mg/dL (ref 8.7–10.2)
Chloride: 102 mmol/L (ref 96–106)
Creatinine, Ser: 0.76 mg/dL (ref 0.57–1.00)
Globulin, Total: 2.4 g/dL (ref 1.5–4.5)
Glucose: 89 mg/dL (ref 70–99)
Potassium: 4.2 mmol/L (ref 3.5–5.2)
Sodium: 140 mmol/L (ref 134–144)
Total Protein: 6.8 g/dL (ref 6.0–8.5)
eGFR: 92 mL/min/{1.73_m2} (ref >59)

## 2024-01-09 LAB — C-REACTIVE PROTEIN: CRP: 1 mg/L (ref 0–10)

## 2024-01-09 LAB — VITAMIN D 25 HYDROXY (VIT D DEFICIENCY, FRACTURES): Vit D, 25-Hydroxy: 46.8 ng/mL (ref 30.0–100.0)

## 2024-01-09 LAB — RHEUMASSURE: 14.3.3 ETA, Rheum. Arthritis: <0.2 ng/mL

## 2024-01-09 LAB — TSH: TSH: 5.55 u[IU]/mL — ABNORMAL HIGH (ref 0.450–4.500)

## 2024-01-09 LAB — SEDIMENTATION RATE: Sed Rate: 9 mm/h (ref 0–40)

## 2024-02-05 ENCOUNTER — Encounter: Payer: Self-pay | Admitting: Internal Medicine

## 2024-02-17 ENCOUNTER — Other Ambulatory Visit: Payer: Self-pay | Admitting: Family Medicine

## 2024-02-17 ENCOUNTER — Ambulatory Visit
Admission: RE | Admit: 2024-02-17 | Discharge: 2024-02-17 | Disposition: A | Discharge status: Home / Self Care | Payer: Self-pay | Source: Ambulatory Visit | Attending: Family Medicine | Admitting: Family Medicine

## 2024-02-17 DIAGNOSIS — Z1231 Encounter for screening mammogram for malignant neoplasm of breast: Secondary | ICD-10-CM

## 2024-02-19 ENCOUNTER — Ambulatory Visit (AMBULATORY_SURGERY_CENTER): Payer: Self-pay

## 2024-02-19 ENCOUNTER — Encounter: Payer: Self-pay | Admitting: Internal Medicine

## 2024-02-19 VITALS — Ht 64.0 in | Wt 158.0 lb

## 2024-02-19 DIAGNOSIS — Z1211 Encounter for screening for malignant neoplasm of colon: Secondary | ICD-10-CM

## 2024-02-19 MED ORDER — NA SULFATE-K SULFATE-MG SULF 17.5-3.13-1.6 GM/177ML PO SOLN: 1.0000 | Freq: Once | ORAL | 0 refills | Status: AC

## 2024-02-19 NOTE — Progress Notes (Signed)

## 2024-03-11 ENCOUNTER — Encounter: Payer: Self-pay | Admitting: Internal Medicine

## 2024-03-11 ENCOUNTER — Ambulatory Visit (AMBULATORY_SURGERY_CENTER): Payer: Self-pay | Admitting: Internal Medicine

## 2024-03-11 VITALS — BP 130/84 | HR 68 | Temp 97.7°F | Resp 16 | Ht 64.0 in | Wt 158.0 lb

## 2024-03-11 DIAGNOSIS — Z1211 Encounter for screening for malignant neoplasm of colon: Secondary | ICD-10-CM

## 2024-03-11 DIAGNOSIS — K5730 Diverticulosis of large intestine without perforation or abscess without bleeding: Secondary | ICD-10-CM | POA: Diagnosis not present

## 2024-03-11 DIAGNOSIS — K6289 Other specified diseases of anus and rectum: Secondary | ICD-10-CM | POA: Diagnosis not present

## 2024-03-11 DIAGNOSIS — D125 Benign neoplasm of sigmoid colon: Secondary | ICD-10-CM

## 2024-03-11 MED ORDER — SODIUM CHLORIDE 0.9 % IV SOLN: 500.0000 mL | Freq: Once | INTRAVENOUS | Status: DC

## 2024-03-11 NOTE — Progress Notes (Signed)
 [facility] History and Physical   Primary Care Physician:  Jolinda Norene HERO, DO   Reason for Procedure:    Encounter Diagnosis  Name Primary?   Special screening for malignant neoplasms, colon Yes     Plan:    colonoscopy     HPI: Makayla Jackson is a 58 y.o. female here for a screening colonoscopy exam. 2014 exam w/o polyps Hx of segmental colon resection due to diverticulitis  Past Medical History:  Diagnosis Date   Allergy    Anxiety    Arthritis    Asthma    Bowel obstruction (HCC) 08/2016   Diverticulitis    Hyperthyroidism 05/14/2019   Miscarriage    Multiple thyroid  nodules    Osteopenia    Parathyroid tumor    Pneumonia    SBO (small bowel obstruction) (HCC)    Thyroid  disease     Past Surgical History:  Procedure Laterality Date   ABDOMINAL HYSTERECTOMY     COLON SURGERY  2014   Diverticulosis resection, after diverticulitis   FINE NEEDLE ASPIRATION     Thyroid  Nodule   KNEE ARTHROSCOPY Right    NASAL SINUS SURGERY  1998   PARATHYROIDECTOMY  12/2014   PARTIAL HYSTERECTOMY  2010   TONSILLECTOMY       Current Outpatient Medications  Medication Sig Dispense Refill   albuterol  (VENTOLIN  HFA) 108 (90 Base) MCG/ACT inhaler Inhale 2 puffs into the lungs every 6 (six) hours as needed for wheezing or shortness of breath. 8 g 0   ALPRAZolam  (XANAX ) 0.25 MG tablet TAKE 0.5 to 1 TABLET(0.25 MG) BY MOUTH qhs AS NEEDED FOR SLEEP OR ANXIETY 30 tablet 1   cetirizine (ZYRTEC) 10 MG tablet Take 10 mg by mouth daily.     fluticasone -salmeterol (ADVAIR) 100-50 MCG/ACT AEPB Inhale 1 puff into the lungs 2 (two) times daily. 180 each 3   levothyroxine  (SYNTHROID ) 25 MCG tablet Take 1 tablet (25 mcg total) by mouth daily. 90 tablet 3   acetaminophen (TYLENOL) 325 MG tablet Take 650 mg by mouth every 6 (six) hours.     ondansetron  (ZOFRAN -ODT) 4 MG disintegrating tablet Take 1 tablet (4 mg total) by mouth every 8 (eight) hours as needed for nausea or  vomiting. 20 tablet 0   SUMAtriptan  (IMITREX ) 50 MG tablet Take 1 tablet (50 mg total) by mouth daily as needed for Migraine (may repeat in 2 hours). 10 tablet 3   valACYclovir  (VALTREX ) 1000 MG tablet Take 2 tablets (2,000 mg total) by mouth 2 (two) times daily. x1 day per flare. Repeat as needed for each fever blister flare. 20 tablet 0   Current Facility-Administered Medications  Medication Dose Route Frequency Provider Last Rate Last Admin   0.9 %  sodium chloride  infusion  500 mL Intravenous Once Avram Lupita BRAVO, MD        Allergies as of 03/11/2024 - Review Complete 03/11/2024  Allergen Reaction Noted   Other Anaphylaxis and Hives 02/20/2016   Shellfish allergy Swelling 12/06/2014   Penicillins Hives 04/17/2012   Prednisone  Other (See Comments) 09/01/2018   Sulfa antibiotics Hives 04/17/2012    Family History  Problem Relation Age of Onset   Arthritis Mother    Arthritis/Rheumatoid Mother    Asthma Mother    Heart disease Mother    Hypertension Mother    Stroke Mother    Vision loss Mother    Thyroid  disease Mother    Colon polyps Mother    Thyroid  cancer Mother  Arthritis Father    Heart disease Father    Hypertension Father    Colon polyps Father    Diverticulitis Father        with colon surgery   Arthritis/Rheumatoid Maternal Aunt    Arthritis/Rheumatoid Maternal Uncle    Diabetes Maternal Grandmother    Lung cancer Maternal Grandfather    Cancer Paternal Grandfather        type unknown   Colon cancer Cousin    Thyroid  cancer Cousin    Breast cancer Cousin    Esophageal cancer Neg Hx    Rectal cancer Neg Hx    Stomach cancer Neg Hx     Social History   Socioeconomic History   Marital status: Married    Spouse name: Not on file   Number of children: 3   Years of education: Not on file   Highest education level: Bachelor's degree (e.g., BA, AB, BS)  Occupational History   Occupation: Magazine features editor: [employer]  Tobacco Use    Smoking status: Never   Smokeless tobacco: Never  Vaping Use   Vaping status: Never Used  Substance and Sexual Activity   Alcohol use: Yes    Alcohol/week: 1.0 standard drink of alcohol    Types: 1 Glasses of wine per week    Comment: occasional glass of wine    Drug use: No   Sexual activity: Not on file  Other Topics Concern   Not on file  Social History Narrative   The patient is married.  Her husband is retiring as Engineer, building services [employer]  after serving in [place].   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[employer]   Drinks 2 glasses of wine daily, no tobacco no drug use   Social Drivers of Corporate investment banker Strain: Patient Declined (09/26/2023)   Overall Financial Resource Strain (CARDIA)    Difficulty of Paying Living Expenses: Patient declined  Food Insecurity: Unknown (09/26/2023)   Hunger Vital Sign    Worried About Running Out of Food in the Last Year: Patient declined    Ran Out of Food in the Last Year: Never true  Transportation Needs: No Transportation Needs (09/26/2023)   PRAPARE - Administrator, Civil Service (Medical): No    Lack of Transportation (Non-Medical): No  Physical Activity: Insufficiently Active (09/26/2023)   Exercise Vital Sign    Days of Exercise per Week: 3 days    Minutes of Exercise per Session: 30 min  Stress: Patient Declined (09/26/2023)   [employer] of Occupational Health - Occupational Stress Questionnaire    Feeling of Stress : Patient declined  Social Connections: Socially Integrated (09/26/2023)   Social Connection and Isolation Panel    Frequency of Communication with Friends and Family: More than three times a week    Frequency of Social Gatherings with Friends and Family: Twice a week    Attends Religious Services: More than 4 times per year    Active Member of [employer] or Organizations: Yes    Attends Engineer, structural: More than 4 times per year    Marital Status:  Married  Catering manager Violence: Not on file    Review of Systems:  All other review of systems negative except as mentioned in the HPI.  Physical Exam: Vital signs BP (!) 143/94 (BP Location: Left Arm, Patient Position: Sitting, Cuff Size: Normal)   Pulse 71   Temp 97.7  F (36.5 C) (Temporal)   Ht 5' 4 (1.626 m)   Wt 158 lb (71.7 kg)   SpO2 100%   BMI 27.12 kg/m   General:   Alert,  Well-developed, well-nourished, pleasant and cooperative in NAD Lungs:  Clear throughout to auscultation.   Heart:  Regular rate and rhythm; no murmurs, clicks, rubs,  or gallops. Abdomen:  Soft, nontender and nondistended. Normal bowel sounds.   Neuro/Psych:  Alert and cooperative. Normal mood and affect. A and O x 3   @Louna Rothgeb  CHARLENA Commander, MD, [facility] [phone] (pager) 03/11/2024 3:59 PM@

## 2024-03-11 NOTE — Progress Notes (Signed)
 Report to PACU, RN, vss, BBS= Clear.

## 2024-03-11 NOTE — Op Note (Signed)
  [facility] Patient Name: Makayla Jackson Procedure Date: 03/11/2024 3:59 PM MRN: [id number] Endoscopist: Lupita FORBES Commander , MD, [id number] Age: 58 Referring MD:  Date of Birth: [birth year] Gender: Female Account #: [id number] Procedure:                Colonoscopy Indications:              Screening for colorectal malignant neoplasm, Last                            colonoscopy: 2014 Medicines:                Monitored Anesthesia Care Procedure:                Pre-Anesthesia Assessment:                           - Prior to the procedure, a History and Physical                            was performed, and patient medications and                            allergies were reviewed. The patient's tolerance of                            previous anesthesia was also reviewed. The risks                            and benefits of the procedure and the sedation                            options and risks were discussed with the patient.                            All questions were answered, and informed consent                            was obtained. Prior Anticoagulants: The patient has                            taken no anticoagulant or antiplatelet agents. ASA                            Grade Assessment: II - A patient with mild systemic                            disease. After reviewing the risks and benefits,                            the patient was deemed in satisfactory condition to                            undergo the procedure.  After obtaining informed consent, the colonoscope                            was passed under direct vision. Throughout the                            procedure, the patient's blood pressure, pulse, and                            oxygen saturations were monitored continuously. The                            Olympus Scope SN [id number] was introduced through the                            anus and advanced to the the cecum,  identified by                            appendiceal orifice and ileocecal valve. The                            colonoscopy was performed without difficulty. The                            patient tolerated the procedure well. The quality                            of the bowel preparation was adequate. The bowel                            preparation used was SUPREP via split dose                            instruction. The ileocecal valve, appendiceal                            orifice, and rectum were photographed. Scope In: 4:11:06 PM Scope Out: 4:22:19 PM Scope Withdrawal Time: 0 hours 9 minutes 13 seconds  Total Procedure Duration: 0 hours 11 minutes 13 seconds  Findings:                 The perianal and digital rectal examinations were                            normal.                           A 6 mm polyp was found in the sigmoid colon. The                            polyp was sessile. The polyp was removed with a                            cold snare. Resection and retrieval were complete.  Verification of patient identification for the                            specimen was done. Estimated blood loss was minimal.                           There was evidence of a prior end-to-end                            colo-colonic anastomosis in the recto-sigmoid                            colon. This was characterized by healthy appearing                            mucosa.                           Multiple diverticula were found in the sigmoid                            colon, transverse colon and ascending colon.                           Anal papilla(e) were hypertrophied.                           The exam was otherwise without abnormality on                            direct and retroflexion views. Complications:            No immediate complications. Estimated Blood Loss:     Estimated blood loss was minimal. Impression:               - One 6 mm polyp  in the sigmoid colon, removed with                            a cold snare. Resected and retrieved.                           - End-to-end colo-colonic anastomosis,                            characterized by healthy appearing mucosa. (s/p                            segmental resection for diverticulitis)                           - Diverticulosis in the sigmoid colon, in the                            transverse colon and in the ascending colon.                           -  Anal papilla(e) were hypertrophied.                           - The examination was otherwise normal on direct                            and retroflexion views. Recommendation:           - Patient has a contact number available for                            emergencies. The signs and symptoms of potential                            delayed complications were discussed with the                            patient. Return to normal activities tomorrow.                            Written discharge instructions were provided to the                            patient.                           - Resume previous diet.                           - Continue present medications.                           - Await pathology results.                           - Repeat colonoscopy is recommended. The                            colonoscopy date will be determined after pathology                            results from today's exam become available for                            review. Lupita FORBES Commander, MD 03/11/2024 4:31:41 PM This report has been signed electronically.

## 2024-03-11 NOTE — Patient Instructions (Addendum)
 I found and removed one small polyp that looks benign.  I will let you know pathology results and when to have another routine colonoscopy by mail and/or My Chart.  You do have some  diverticulosis still.  I appreciate the opportunity to care for you. Lupita CHARLENA Commander, MD, FACG  YOU HAD AN ENDOSCOPIC PROCEDURE TODAY AT THE [facility]:   Refer to the procedure report that was given to you for any specific questions about what was found during the examination.  If the procedure report does not answer your questions, please call your gastroenterologist to clarify.  If you requested that your care partner not be given the details of your procedure findings, then the procedure report has been included in a sealed envelope for you to review at your convenience later.  YOU SHOULD EXPECT: Some feelings of bloating in the abdomen. Passage of more gas than usual.  Walking can help get rid of the air that was put into your GI tract during the procedure and reduce the bloating. If you had a lower endoscopy (such as a colonoscopy or flexible sigmoidoscopy) you may notice spotting of blood in your stool or on the toilet paper. If you underwent a bowel prep for your procedure, you may not have a normal bowel movement for a few days.  Please Note:  You might notice some irritation and congestion in your nose or some drainage.  This is from the oxygen used during your procedure.  There is no need for concern and it should clear up in a day or so.  SYMPTOMS TO REPORT IMMEDIATELY:  Following lower endoscopy (colonoscopy or flexible sigmoidoscopy):  Excessive amounts of blood in the stool  Significant tenderness or worsening of abdominal pains  Swelling of the abdomen that is new, acute  Fever of 100F or higher  For urgent or emergent issues, a gastroenterologist can be reached at any hour by calling [phone]. Do not use MyChart messaging for urgent concerns.    DIET:  We do recommend a  small meal at first, but then you may proceed to your regular diet.  Drink plenty of fluids but you should avoid alcoholic beverages for 24 hours.  ACTIVITY:  You should plan to take it easy for the rest of today and you should NOT DRIVE or use heavy machinery until tomorrow (because of the sedation medicines used during the test).    FOLLOW UP: Our staff will call the number listed on your records the next business day following your procedure.  We will call around 7:15- 8:00 am to check on you and address any questions or concerns that you may have regarding the information given to you following your procedure. If we do not reach you, we will leave a message.     If any biopsies were taken you will be contacted by phone or by letter within the next 1-3 weeks.  Please call us  at [phone] if you have not heard about the biopsies in 3 weeks.    SIGNATURES/CONFIDENTIALITY: You and/or your care partner have signed paperwork which will be entered into your electronic medical record.  These signatures attest to the fact that that the information above on your After Visit Summary has been reviewed and is understood.  Full responsibility of the confidentiality of this discharge information lies with you and/or your care-partner.

## 2024-03-11 NOTE — Progress Notes (Signed)
 Called to room to assist during endoscopic procedure.  Patient ID and intended procedure confirmed with present staff. Received instructions for my participation in the procedure from the performing physician.

## 2024-03-11 NOTE — Progress Notes (Signed)
 Vitals-DT  Pt's states no medical or surgical changes since previsit or office visit.

## 2024-03-12 ENCOUNTER — Telehealth: Payer: Self-pay | Admitting: *Deleted

## 2024-03-12 NOTE — Telephone Encounter (Signed)
  Follow up Call-     03/11/2024    3:14 PM 03/11/2024    3:07 PM  Call back number  Post procedure Call Back phone  # [phone]   Permission to leave phone message  Yes     Patient questions:  Do you have a fever, pain , or abdominal swelling? No. Pain Score  0 *  Have you tolerated food without any problems? Yes.    Have you been able to return to your normal activities? Yes.    Do you have any questions about your discharge instructions: Diet   No. Medications  No. Follow up visit  No.  Do you have questions or concerns about your Care? No.  Actions: * If pain score is 4 or above: No action needed, pain <4.

## 2024-03-16 LAB — SURGICAL PATHOLOGY

## 2024-03-20 ENCOUNTER — Encounter: Payer: Self-pay | Admitting: Internal Medicine

## 2024-03-20 ENCOUNTER — Ambulatory Visit: Payer: Self-pay | Admitting: Internal Medicine

## 2024-03-20 DIAGNOSIS — Z860101 Personal history of adenomatous and serrated colon polyps: Secondary | ICD-10-CM | POA: Insufficient documentation

## 2024-03-30 ENCOUNTER — Encounter: Payer: Self-pay | Admitting: Family Medicine

## 2024-03-30 ENCOUNTER — Telehealth (INDEPENDENT_AMBULATORY_CARE_PROVIDER_SITE_OTHER): Admitting: Family Medicine

## 2024-03-30 DIAGNOSIS — J0100 Acute maxillary sinusitis, unspecified: Secondary | ICD-10-CM

## 2024-03-30 MED ORDER — LEVOFLOXACIN 500 MG PO TABS: 500.0000 mg | ORAL_TABLET | Freq: Every day | ORAL | 0 refills | Status: DC

## 2024-03-30 NOTE — Progress Notes (Signed)
 Subjective:  Patient ID: Makayla Jackson, female    DOB: 1966/07/21  Age: 58 y.o. MRN: [id number]  CC: No chief complaint on file.   HPI  Discussed the use of AI scribe software for clinical note transcription with the patient, who gave verbal consent to proceed.  History of Present Illness Makayla Jackson is a 58 year old female who presents with symptoms suggestive of a sinus infection.  She has a history of frequent sinus infections, although she has not experienced one in several months. Recently, she has noticed a sensation of fullness in her ears and a swollen lymph node under her left ear, which is sore and has been present for about a week. The swelling has worsened, causing discomfort when she sleeps on that side, waking her up several times at night.  She describes significant sinus pressure and fullness in her ears, with sharp stabbing pains in the left ear for a couple of days. She likens the sensation to having water in her ear, although she confirms there is no water present.  She suspects she may have had a fever last night, as she experienced sweating and chills while sleeping, but she did not take her temperature. She has been using allergy medication and Flonase , but her symptoms persist.  She reports sinus pressure on both sides of her face, describing it as feeling like she has been hit in the face, and notes that her eyes have been extremely puffy. Her eyes have been extremely puffy.  She experiences constant drainage down the back of her throat, often waking up choking on it. This drainage leads to a cough, as it feels like it is choking her. She notes that pressing on her eyes causes a sensation of drainage down her throat, especially in the mornings.  She is allergic to penicillin and sulfa drugs. 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          01/06/2024    3:30 PM 10/17/2023    2:52 PM 08/13/2023    3:51 PM  Depression screen PHQ  2/9  Decreased Interest 1 1 1   Down, Depressed, Hopeless 1 1 0  PHQ - 2 Score 2 2 1   Altered sleeping 1 1 1   Tired, decreased energy 2 1 1   Change in appetite 1 0 0  Feeling bad or failure about yourself  0 0 0  Trouble concentrating 0 0 0  Moving slowly or fidgety/restless 0 0 0  Suicidal thoughts 0 0 0  PHQ-9 Score 6 4 3   Difficult doing work/chores Somewhat difficult Not difficult at all Somewhat difficult    History Makayla Jackson has a past medical history of Allergy, Anxiety, Arthritis, Asthma, Bowel obstruction (HCC) (08/2016), Diverticulitis, adenomatous polyp of colon (03/20/2024), Hyperthyroidism (05/14/2019), Miscarriage, Multiple thyroid  nodules, Osteopenia, Parathyroid tumor, Pneumonia, SBO (small bowel obstruction) (HCC), and Thyroid  disease.   She has a past surgical history that includes Colon surgery (2014); Partial hysterectomy (2010); Nasal sinus surgery (1998); Tonsillectomy; Fine needle aspiration; Knee arthroscopy (Right); Parathyroidectomy (12/2014); and Abdominal hysterectomy.   Her family history includes Arthritis in her father and mother; Arthritis/Rheumatoid in her maternal aunt, maternal uncle, and mother; Asthma in her mother; Breast cancer in her cousin; Cancer in her paternal grandfather; Colon cancer in her cousin; Colon polyps in her father and mother; Diabetes in her maternal grandmother; Diverticulitis in her father; Heart disease in her father and mother; Hypertension in her father and mother;  Lung cancer in her maternal grandfather; Stroke in her mother; Thyroid  cancer in her cousin and mother; Thyroid  disease in her mother; Vision loss in her mother.She reports that she has never smoked. She has never used smokeless tobacco. She reports current alcohol use of about 1.0 standard drink of alcohol per week. She reports that she does not use drugs.    ROS Review of Systems  Objective:  There were no vitals taken for this visit.  BP Readings from Last 3  Encounters:  03/11/24 130/84  01/06/24 119/78  10/17/23 (!) 138/96    Wt Readings from Last 3 Encounters:  03/11/24 158 lb (71.7 kg)  02/19/24 158 lb (71.7 kg)  01/06/24 163 lb (73.9 kg)     Physical Exam Physical Exam GENERAL: Alert, cooperative, well developed, no acute distress. HEENT: Normocephalic, normal oropharynx, moist mucous membranes, puffiness and swelling under eyes and in cheeks. NEUROLOGICAL: Cranial nerves grossly intact, moves all extremities without gross motor or sensory deficit.    Assessment & Plan:  Acute maxillary sinusitis, recurrence not specified  Other orders -     levoFLOXacin ; Take 1 tablet (500 mg total) by mouth daily. For 10 days  Dispense: 10 tablet; Refill: 0    Assessment and Plan Assessment & Plan Acute sinusitis with left cervical lymphadenopathy   She presents with acute sinusitis and left cervical lymphadenopathy, experiencing sinus pressure, ear fullness, sharp ear pain, facial puffiness, and postnasal drainage that disrupts sleep. Night sweats and chills suggest a possible fever. She has a history of frequent sinus infections, though none recently. Allergic to penicillin and sulfa drugs. Prescribe levofloxacin  for sinusitis due to allergies and its efficacy, advising to take with food. Discontinue levofloxacin  and contact the office if severe diarrhea or tendon soreness occurs. Send prescription to [facility].  Recording duration: 7 minutes     Virtual Visit  Note  I discussed the limitations, risks, security and privacy concerns of performing an evaluation and management service by Video and the availability of in person appointments. I also discussed with the patient that there may be a patient responsible charge related to this service. The patient expressed understanding and agreed to proceed. Pt. Is at home. Dr. Zollie is in his office.  Follow Up Instructions:   I discussed the assessment and treatment plan with the  patient. The patient was provided an opportunity to ask questions and all were answered. The patient agreed with the plan and demonstrated an understanding of the instructions.   The patient was advised to call back or seek an in-person evaluation if the symptoms worsen or if the condition fails to improve as anticipated.     Follow-up: No follow-ups on file.  Butler Zollie, M.D.

## 2024-05-15 ENCOUNTER — Telehealth: Admitting: Family Medicine

## 2024-05-15 DIAGNOSIS — Z20818 Contact with and (suspected) exposure to other bacterial communicable diseases: Secondary | ICD-10-CM | POA: Diagnosis not present

## 2024-05-15 DIAGNOSIS — J020 Streptococcal pharyngitis: Secondary | ICD-10-CM | POA: Diagnosis not present

## 2024-05-15 MED ORDER — BENZONATATE 200 MG PO CAPS: 200.0000 mg | ORAL_CAPSULE | Freq: Two times a day (BID) | ORAL | 0 refills | Status: DC | PRN

## 2024-05-15 MED ORDER — CEFDINIR 300 MG PO CAPS: 300.0000 mg | ORAL_CAPSULE | Freq: Two times a day (BID) | ORAL | 0 refills | Status: AC

## 2024-05-15 NOTE — Patient Instructions (Signed)

## 2024-05-15 NOTE — Progress Notes (Signed)
 Virtual Visit Consent   Makayla Jackson, you are scheduled for a virtual visit with a [facility] provider today. Just as with appointments in the office, your consent must be obtained to participate. Your consent will be active for this visit and any virtual visit you may have with one of our providers in the next 365 days. If you have a MyChart account, a copy of this consent can be sent to you electronically.  As this is a virtual visit, video technology does not allow for your provider to perform a traditional examination. This may limit your provider's ability to fully assess your condition. If your provider identifies any concerns that need to be evaluated in person or the need to arrange testing (such as labs, EKG, etc.), we will make arrangements to do so. Although advances in technology are sophisticated, we cannot ensure that it will always work on either your end or our end. If the connection with a video visit is poor, the visit may have to be switched to a telephone visit. With either a video or telephone visit, we are not always able to ensure that we have a secure connection.  By engaging in this virtual visit, you consent to the provision of healthcare and authorize for your insurance to be billed (if applicable) for the services provided during this visit. Depending on your insurance coverage, you may receive a charge related to this service.  I need to obtain your verbal consent now. Are you willing to proceed with your visit today? Makayla Jackson has provided verbal consent on 05/15/2024 for a virtual visit (video or telephone). Loa Lamp, FNP  Date: 05/15/2024 8:55 AM   Virtual Visit via Video Note   I, Loa Lamp, connected with  Makayla Jackson  ([id number], January 22, 1966) on 05/15/24 at  9:00 AM EDT by a video-enabled telemedicine application and verified that I am speaking with the correct person using two identifiers.  Location: Patient: Virtual Visit Location Patient:  Home Provider: Virtual Visit Location Provider: Home Office   I discussed the limitations of evaluation and management by telemedicine and the availability of in person appointments. The patient expressed understanding and agreed to proceed.    History of Present Illness: Makayla Jackson is a 58 y.o. who identifies as a female who was assigned female at birth, and is being seen today for sore throat ,fever, pain with swallowing, aching, headache, exposure to strep .  HPI: HPI  Problems:  Patient Active Problem List   Diagnosis Date Noted   Hx of adenomatous polyp of colon 03/20/2024   Conjunctivitis 08/14/2021   Arthralgia 05/14/2019   Irritable bowel syndrome with both constipation and diarrhea 06/18/2018   Hyperparathyroidism 10/01/2017   History of small bowel obstruction 10/18/2016   Anxiety 07/22/2015   Papule of skin 02/08/2015   Migraine with aura and without status migrainosus, not intractable 12/21/2013   Hemorrhagic ovarian cyst 08/20/2012   Multinodular goiter 05/25/2012    Allergies:  Allergies  Allergen Reactions   Other Anaphylaxis and Hives    Bee stings   Shellfish Allergy Swelling   Penicillins Hives   Prednisone  Other (See Comments)    Pt stated, It made me feel slightly agitated. I could not sleep well   Sulfa Antibiotics Hives   Medications:  Current Outpatient Medications:    benzonatate  (TESSALON ) 200 MG capsule, Take 1 capsule (200 mg total) by mouth 2 (two) times daily as needed for cough., Disp: 20 capsule, Rfl: 0  cefdinir  (OMNICEF ) 300 MG capsule, Take 1 capsule (300 mg total) by mouth 2 (two) times daily for 10 days., Disp: 20 capsule, Rfl: 0   acetaminophen (TYLENOL) 325 MG tablet, Take 650 mg by mouth every 6 (six) hours., Disp: , Rfl:    albuterol  (VENTOLIN  HFA) 108 (90 Base) MCG/ACT inhaler, Inhale 2 puffs into the lungs every 6 (six) hours as needed for wheezing or shortness of breath., Disp: 8 g, Rfl: 0   ALPRAZolam  (XANAX ) 0.25 MG tablet,  TAKE 0.5 to 1 TABLET(0.25 MG) BY MOUTH qhs AS NEEDED FOR SLEEP OR ANXIETY, Disp: 30 tablet, Rfl: 1   cetirizine (ZYRTEC) 10 MG tablet, Take 10 mg by mouth daily., Disp: , Rfl:    fluticasone -salmeterol (ADVAIR) 100-50 MCG/ACT AEPB, Inhale 1 puff into the lungs 2 (two) times daily., Disp: 180 each, Rfl: 3   levofloxacin  (LEVAQUIN ) 500 MG tablet, Take 1 tablet (500 mg total) by mouth daily. For 10 days, Disp: 10 tablet, Rfl: 0   levothyroxine  (SYNTHROID ) 25 MCG tablet, Take 1 tablet (25 mcg total) by mouth daily., Disp: 90 tablet, Rfl: 3   ondansetron  (ZOFRAN -ODT) 4 MG disintegrating tablet, Take 1 tablet (4 mg total) by mouth every 8 (eight) hours as needed for nausea or vomiting., Disp: 20 tablet, Rfl: 0   SUMAtriptan  (IMITREX ) 50 MG tablet, Take 1 tablet (50 mg total) by mouth daily as needed for Migraine (may repeat in 2 hours)., Disp: 10 tablet, Rfl: 3   valACYclovir  (VALTREX ) 1000 MG tablet, Take 2 tablets (2,000 mg total) by mouth 2 (two) times daily. x1 day per flare. Repeat as needed for each fever blister flare., Disp: 20 tablet, Rfl: 0  Observations/Objective: Patient is well-developed, well-nourished in no acute distress.  Resting comfortably  at home.  Head is normocephalic, atraumatic.  No labored breathing.  Speech is clear and coherent with logical content.  Patient is alert and oriented at baseline.    Assessment and Plan: 1. Exposure to strep throat (Primary)  2. Pharyngitis due to Streptococcus species  Increase fluids, warm salt water gargles, ibuprofen as directed, UC as needed.   Follow Up Instructions: I discussed the assessment and treatment plan with the patient. The patient was provided an opportunity to ask questions and all were answered. The patient agreed with the plan and demonstrated an understanding of the instructions.  A copy of instructions were sent to the patient via MyChart unless otherwise noted below.     The patient was advised to call back or  seek an in-person evaluation if the symptoms worsen or if the condition fails to improve as anticipated.    Jesusmanuel Erbes, FNP

## 2024-06-29 ENCOUNTER — Telehealth: Admitting: Emergency Medicine

## 2024-06-29 DIAGNOSIS — B9689 Other specified bacterial agents as the cause of diseases classified elsewhere: Secondary | ICD-10-CM | POA: Diagnosis not present

## 2024-06-29 DIAGNOSIS — J019 Acute sinusitis, unspecified: Secondary | ICD-10-CM

## 2024-06-29 DIAGNOSIS — R051 Acute cough: Secondary | ICD-10-CM

## 2024-06-29 MED ORDER — DOXYCYCLINE HYCLATE 100 MG PO TABS: 100.0000 mg | ORAL_TABLET | Freq: Two times a day (BID) | ORAL | 0 refills | Status: DC

## 2024-06-29 MED ORDER — BENZONATATE 100 MG PO CAPS: 100.0000 mg | ORAL_CAPSULE | Freq: Three times a day (TID) | ORAL | 0 refills | Status: DC | PRN

## 2024-06-29 NOTE — Progress Notes (Signed)
 E-Visit for Sinus Problems  We are sorry that you are not feeling well.  Here is how we plan to help!  Based on what you have shared with me it looks like you have sinusitis.  Sinusitis is inflammation and infection in the sinus cavities of the head.  Based on your presentation I believe you most likely have Acute Bacterial Sinusitis.  This is an infection caused by bacteria and is treated with antibiotics. I have prescribed Doxycycline  100mg  one tablet twice daily with food, for 7 days. I have prescribed Tessalon  perles 100mg  Take 1-2 capsules every 8 hours as needed for cough. You may use an oral decongestant such as Mucinex  D or if you have glaucoma or high blood pressure use plain Mucinex . Saline nasal spray help and can safely be used as often as needed for congestion.  If you develop worsening sinus pain, fever or notice severe headache and vision changes, or if symptoms are not better after completion of antibiotic, please schedule an appointment with a health care provider.    Sinus infections are not as easily transmitted as other respiratory infection, however we still recommend that you avoid close contact with loved ones, especially the very young and elderly.  Remember to wash your hands thoroughly throughout the day as this is the number one way to prevent the spread of infection!  Home Care: Only take medications as instructed by your medical team. Complete the entire course of an antibiotic. Do not take these medications with alcohol. A steam or ultrasonic humidifier can help congestion.  You can place a towel over your head and breathe in the steam from hot water coming from a faucet. Avoid close contacts especially the very young and the elderly. Cover your mouth when you cough or sneeze. Always remember to wash your hands.  Get Help Right Away If: You develop worsening fever or sinus pain. You develop a severe head ache or visual changes. Your symptoms persist after you  have completed your treatment plan.  Make sure you Understand these instructions. Will watch your condition. Will get help right away if you are not doing well or get worse.  Your e-visit answers were reviewed by a board certified advanced clinical practitioner to complete your personal care plan.  Depending on the condition, your plan could have included both over the counter or prescription medications.  If there is a problem please reply  once you have received a response from your provider.  Your safety is important to us .  If you have drug allergies check your prescription carefully.    You can use MyChart to ask questions about today's visit, request a non-urgent call back, or ask for a work or school excuse for 24 hours related to this e-Visit. If it has been greater than 24 hours you will need to follow up with your provider, or enter a new e-Visit to address those concerns.  You will get an e-mail in the next two days asking about your experience.  I hope that your e-visit has been valuable and will speed your recovery. Thank you for using e-visits.  I have spent 5 minutes in review of e-visit questionnaire, review and updating patient chart, medical decision making and response to patient.   Delon CHRISTELLA Dickinson, PA-C

## 2024-07-07 ENCOUNTER — Ambulatory Visit: Payer: Self-pay | Admitting: Family Medicine

## 2024-07-07 ENCOUNTER — Encounter: Payer: Self-pay | Admitting: Family Medicine

## 2024-07-07 ENCOUNTER — Ambulatory Visit (INDEPENDENT_AMBULATORY_CARE_PROVIDER_SITE_OTHER)

## 2024-07-07 VITALS — BP 126/73 | HR 73 | Temp 97.9°F | Ht 64.0 in | Wt 162.0 lb

## 2024-07-07 DIAGNOSIS — F419 Anxiety disorder, unspecified: Secondary | ICD-10-CM

## 2024-07-07 DIAGNOSIS — M25552 Pain in left hip: Secondary | ICD-10-CM

## 2024-07-07 DIAGNOSIS — B9689 Other specified bacterial agents as the cause of diseases classified elsewhere: Secondary | ICD-10-CM

## 2024-07-07 MED ORDER — DOXYCYCLINE HYCLATE 100 MG PO TABS: 100.0000 mg | ORAL_TABLET | Freq: Two times a day (BID) | ORAL | 0 refills | Status: AC

## 2024-07-07 MED ORDER — ALPRAZOLAM 0.25 MG PO TABS: ORAL_TABLET | ORAL | 1 refills | Status: AC

## 2024-07-07 NOTE — Progress Notes (Signed)
 Subjective: CC: Follow-up anxiety PCP: Jolinda Norene HERO, DO YEP:Makayla Jackson is a 58 y.o. female presenting to clinic today for:  Patient with intermittent use of alprazolam  for panic attack and anxiety.  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  She notes no concerning features including memory changes, excessive daytime sedation.  She did sustain a mechanical fall at the vet yesterday where both of her dogs leashes got wrapped around her legs and she fell onto the ground onto her left hip.  She is actually having some left hip pain and points to the lateral aspect of the hip but notes that it radiates down to the groin area.  She is able to ambulate independently but she is uncomfortable in doing so.  She also reports being treated for an acute bacterial sinusitis.  She has a couple of days left of doxycycline .  Her symptoms are not totally resolved.  She reports sinus pressure, headache.  Symptoms onset over a month ago but she was only put on antibiotics about a week ago.   ROS: Per HPI  Allergies  Allergen Reactions   Other Anaphylaxis and Hives    Bee stings   Shellfish Allergy Swelling   Penicillins Hives   Prednisone  Other (See Comments)    Pt stated, It made me feel slightly agitated. I could not sleep well   Sulfa Antibiotics Hives   Past Medical History:  Diagnosis Date   Allergy    Anxiety    Arthritis    Asthma    Bowel obstruction (HCC) 08/2016   Diverticulitis    Hx of adenomatous polyp of colon 03/20/2024   Hyperthyroidism 05/14/2019   Miscarriage    Multiple thyroid  nodules    Osteopenia    Parathyroid tumor    Pneumonia    SBO (small bowel obstruction) (HCC)    Thyroid  disease     Current Outpatient Medications:    acetaminophen (TYLENOL) 325 MG tablet, Take 650 mg by mouth every 6 (six) hours., Disp: , Rfl:    albuterol   (VENTOLIN  HFA) 108 (90 Base) MCG/ACT inhaler, Inhale 2 puffs into the lungs every 6 (six) hours as needed for wheezing or shortness of breath., Disp: 8 g, Rfl: 0   ALPRAZolam  (XANAX ) 0.25 MG tablet, TAKE 0.5 to 1 TABLET(0.25 MG) BY MOUTH qhs AS NEEDED FOR SLEEP OR ANXIETY, Disp: 30 tablet, Rfl: 1   cetirizine (ZYRTEC) 10 MG tablet, Take 10 mg by mouth daily., Disp: , Rfl:    fluticasone -salmeterol (ADVAIR) 100-50 MCG/ACT AEPB, Inhale 1 puff into the lungs 2 (two) times daily., Disp: 180 each, Rfl: 3   SUMAtriptan  (IMITREX ) 50 MG tablet, Take 1 tablet (50 mg total) by mouth daily as needed for Migraine (may repeat in 2 hours)., Disp: 10 tablet, Rfl: 3   valACYclovir  (VALTREX ) 1000 MG tablet, Take 2 tablets (2,000 mg total) by mouth 2 (two) times daily. x1 day per flare. Repeat as needed for each fever blister flare., Disp: 20 tablet, Rfl: 0 Social History   Socioeconomic History   Marital status: Married    Spouse name: Not on file   Number of children: 3   Years of education: Not on file   Highest education level: Bachelor's degree (e.g., BA, AB, BS)  Occupational History   Occupation: Magazine Features Editor: [employer]  Tobacco Use  Smoking status: Never   Smokeless tobacco: Never  Vaping Use   Vaping status: Never Used  Substance and Sexual Activity   Alcohol use: Yes    Alcohol/week: 1.0 standard drink of alcohol    Types: 1 Glasses of wine per week    Comment: occasional glass of wine    Drug use: No   Sexual activity: Not on file  Other Topics Concern   Not on file  Social History Narrative   The patient is married.  Her husband is retiring as Engineer, [employer]  after serving in [place].   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[facility]   Drinks 2 glasses of wine daily, no tobacco no drug use   Social Drivers of Corporate Investment Banker Strain: Patient Declined (09/26/2023)   Overall Financial Resource Strain (CARDIA)     Difficulty of Paying Living Expenses: Patient declined  Food Insecurity: Unknown (09/26/2023)   Hunger Vital Sign    Worried About Running Out of Food in the Last Year: Patient declined    Ran Out of Food in the Last Year: Never true  Transportation Needs: No Transportation Needs (09/26/2023)   PRAPARE - Administrator, Civil Service (Medical): No    Lack of Transportation (Non-Medical): No  Physical Activity: Insufficiently Active (09/26/2023)   Exercise Vital Sign    Days of Exercise per Week: 3 days    Minutes of Exercise per Session: 30 min  Stress: Patient Declined (09/26/2023)   [employer] of Occupational Health - Occupational Stress Questionnaire    Feeling of Stress : Patient declined  Social Connections: Socially Integrated (09/26/2023)   Social Connection and Isolation Panel    Frequency of Communication with Friends and Family: More than three times a week    Frequency of Social Gatherings with Friends and Family: Twice a week    Attends Religious Services: More than 4 times per year    Active Member of [employer] or Organizations: Yes    Attends Engineer, Structural: More than 4 times per year    Marital Status: Married  Catering Manager Violence: Not on file   Family History  Problem Relation Age of Onset   Arthritis Mother    Arthritis/Rheumatoid Mother    Asthma Mother    Heart disease Mother    Hypertension Mother    Stroke Mother    Vision loss Mother    Thyroid  disease Mother    Colon polyps Mother    Thyroid  cancer Mother    Arthritis Father    Heart disease Father    Hypertension Father    Colon polyps Father    Diverticulitis Father        with colon surgery   Arthritis/Rheumatoid Maternal Aunt    Arthritis/Rheumatoid Maternal Uncle    Diabetes Maternal Grandmother    Lung cancer Maternal Grandfather    Cancer Paternal Grandfather        type unknown   Colon cancer Cousin    Thyroid  cancer Cousin    Breast cancer Cousin     Esophageal cancer Neg Hx    Rectal cancer Neg Hx    Stomach cancer Neg Hx     Objective: Office vital signs reviewed. BP 126/73   Pulse 73   Temp 97.9 F (36.6 C)   Ht 5' 4 (1.626 m)   Wt 162 lb (73.5 kg)   SpO2 97%   BMI 27.81 kg/m  Physical Examination:  General: Awake, alert, well nourished, No acute distress HEENT: Normal    Neck: No masses palpated. No lymphadenopathy    Ears: Tympanic membranes intact, normal light reflex, no erythema, no bulging    Eyes: PERRLA, extraocular membranes intact, sclera white    Nose: nasal turbinates moist, clear nasal discharge    Throat: moist mucus membranes, no erythema, no tonsillar exudate.  Airway is patent Cardio: regular rate and rhythm, S1S2 heard, no murmurs appreciated Pulm: clear to auscultation bilaterally, no wheezes, rhonchi or rales; normal work of breathing on room air MSK: Point tenderness to the lateral aspect of the left hip near the bursa. She is ambulating independently with antalgic gait     07/07/2024    3:41 PM 01/06/2024    3:30 PM 10/17/2023    2:52 PM  Depression screen PHQ 2/9  Decreased Interest 1 1 1   Down, Depressed, Hopeless 1 1 1   PHQ - 2 Score 2 2 2   Altered sleeping 1 1 1   Tired, decreased energy 1 2 1   Change in appetite 1 1 0  Feeling bad or failure about yourself  0 0 0  Trouble concentrating 1 0 0  Moving slowly or fidgety/restless 0 0 0  Suicidal thoughts 0 0 0  PHQ-9 Score 6 6  4    Difficult doing work/chores Somewhat difficult Somewhat difficult Not difficult at all     Data saved with a previous flowsheet row definition      07/07/2024    3:42 PM 01/06/2024    3:30 PM 08/13/2023    3:52 PM 08/05/2023    2:11 PM  GAD 7 : Generalized Anxiety Score  Nervous, Anxious, on Edge 1 1 1 1   Control/stop worrying 1 0 0 1  Worry too much - different things 1 1 0 1  Trouble relaxing 1 1 1 1   Restless 0 0 0 0  Easily annoyed or irritable 1 1 0 0  Afraid - awful might happen 0 0 0 0  Total GAD  7 Score 5 4 2 4   Anxiety Difficulty Somewhat difficult Somewhat difficult Somewhat difficult     Assessment/ Plan: 58 y.o. female   Anxiety - Plan: ALPRAZolam  (XANAX ) 0.25 MG tablet  Left hip pain - Plan: DG Hip Unilat W OR W/O Pelvis 2-3 Views Left  Acute bacterial sinusitis - Plan: doxycycline  (VIBRA -TABS) 100 MG tablet   Anxiety stable.  UDS and CSA are up-to-date.  National narcotic database reviewed with no red flags  Plain films of left hip ordered given left hip pain.  Though I suspect bursitis versus contusion.  Will rule out fracture  Doxycycline  renewed to complete a 10-day course of medication for acute bacterial sinusitis.  Defer influenza vaccination for at least a couple of weeks   Makayla Hillmann M Guy Seese, DO [facility] [phone]

## 2024-07-07 NOTE — Patient Instructions (Signed)
 Hip Bursitis  Hip bursitis is swelling of one or more fluid-filled sacs (bursae) in your hip joint. If the bursa becomes irritated, it can fill with extra fluid and become swollen. This condition can cause pain, and your symptoms may come and go over time. What are the causes? Repeated use of your hip muscles. Injury to the hip. Weak butt muscles. Bone spurs. Infection. In some cases, the cause may not be known. What increases the risk? Having a past hip injury or hip surgery. Having a condition, such as arthritis, gout, diabetes, or thyroid disease. Having spine problems. Having one leg that is shorter than the other. Running a lot or doing long-distance running. Playing sports where there is a risk of injury or falling, such as football, martial arts, or skiing. What are the signs or symptoms? Symptoms may come and go, and they often include: Pain in the hip or groin area. Pain may get worse when you move your hip. Tenderness and swelling of the hip. In rare cases, the bursa may become infected. If this happens, you may: Get a fever. Have warmth and redness in the hip area. How is this treated? This condition is treated by: Resting your hip. Icing your hip. Wrapping the hip area with an elastic bandage (compression wrap). Keeping the hip raised. Other treatments may include: Using crutches, a cane, or a walker. Medicines. Draining fluid out of the bursa. Surgery to take out a bursa. This is rare. Long-term treatment may include: Doing exercises to help your strength and flexibility. Lifestyle changes like losing weight to lessen the strain on your hip. Follow these instructions at home: Managing pain, stiffness, and swelling     If told, put ice on the painful area. To do this: Put ice in a plastic bag. Place a towel between your skin and the bag. Leave the ice on for 20 minutes, 2-3 times a day. Take off the ice if your skin turns bright red. This is very important.  If you cannot feel pain, heat, or cold, you have a greater risk of damage to the area. Raise your hip by putting a pillow under your hips while you lie down. Stop if you feel pain. If told, put heat on the affected area. Do this as often as told by your doctor. Use the heat source that your doctor recommends, such as a moist heat pack or a heating pad. Place a towel between your skin and the heat source. Leave the heat on for 20-30 minutes. Take off the heat if your skin turns bright red. This is very important. If you cannot feel pain, heat, or cold, you have a greater risk of getting burned. Activity Do not use your hip to support your body weight until your doctor says that you can. Use crutches, a cane, or a walker as told by your doctor. If the affected leg is one that you use to drive, ask your doctor if it is safe to drive. Rest and protect your hip as much as you can until you feel better. Return to your normal activities when your doctor says that it is safe. Do exercises as told by your doctor. General instructions Take over-the-counter and prescription medicines only as told by your doctor. Gently rub and stretch your injured area as often as is comfortable. Wear elastic bandages only as told by your doctor. If one of your legs is shorter than the other, get fitted for a shoe insert or orthotic. Keep a healthy  weight. Follow instructions from your doctor. Keep all follow-up visits. How is this prevented? Exercise regularly or as told by your doctor. Wear the right shoes for the sport you play and for daily activities. Warm up and stretch before being active. Cool down and stretch after being active. Take breaks often from repeated activity. Avoid activities that bother your hip or cause pain. Avoid sitting down for a long time. Where to find more information American Academy of Orthopaedic Surgeons: orthoinfo.aaos.org Contact a doctor if: You have a fever. You have new  symptoms. You have trouble walking or doing everyday activities. You have pain that gets worse or does not get better with medicine. The skin around your hip is red. You get a feeling of warmth in your hip area. Get help right away if: You cannot move your hip. You have very bad pain. You cannot control the muscles in your feet. Summary Hip bursitis is swelling of one or more fluid-filled sacs (bursae) in your hip joint. Symptoms often come and go over time. This condition is often treated by resting and icing the hip. It also may help to keep the area raised and wrapped in an elastic bandage. Other treatments may be needed. This information is not intended to replace advice given to you by your health care provider. Make sure you discuss any questions you have with your health care provider. Document Revised: 07/18/2021 Document Reviewed: 07/18/2021 Elsevier Patient Education  2024 [employer].

## 2024-07-19 ENCOUNTER — Encounter: Payer: Self-pay | Admitting: Family Medicine

## 2024-07-19 DIAGNOSIS — B372 Candidiasis of skin and nail: Secondary | ICD-10-CM

## 2024-07-19 DIAGNOSIS — B3731 Acute candidiasis of vulva and vagina: Secondary | ICD-10-CM

## 2024-07-20 MED ORDER — FLUCONAZOLE 150 MG PO TABS: 150.0000 mg | ORAL_TABLET | Freq: Once | ORAL | 0 refills | Status: AC

## 2024-07-20 NOTE — Telephone Encounter (Signed)

## 2024-07-27 ENCOUNTER — Ambulatory Visit (INDEPENDENT_AMBULATORY_CARE_PROVIDER_SITE_OTHER)

## 2024-07-27 DIAGNOSIS — Z23 Encounter for immunization: Secondary | ICD-10-CM

## 2024-07-27 NOTE — Progress Notes (Signed)
 Administered FLU VACCINATION. Injection given to patient in RIGHT DELTOID. Patient tolerated well.

## 2025-01-08 ENCOUNTER — Encounter: Payer: Self-pay | Admitting: Family Medicine
